# Patient Record
Sex: Female | Born: 1990 | Race: Black or African American | Hispanic: No | Marital: Single | Smoking: Never smoker
Health system: Southern US, Community
[De-identification: ages and names within clinical notes are randomized; demographics above are authoritative.]

## PROBLEM LIST (undated history)

## (undated) ENCOUNTER — Inpatient Hospital Stay (HOSPITAL_COMMUNITY): Payer: Self-pay

## (undated) DIAGNOSIS — I10 Essential (primary) hypertension: Secondary | ICD-10-CM

## (undated) DIAGNOSIS — R0600 Dyspnea, unspecified: Secondary | ICD-10-CM

## (undated) DIAGNOSIS — R51 Headache: Secondary | ICD-10-CM

## (undated) DIAGNOSIS — R519 Headache, unspecified: Secondary | ICD-10-CM

## (undated) DIAGNOSIS — D649 Anemia, unspecified: Secondary | ICD-10-CM

## (undated) DIAGNOSIS — F419 Anxiety disorder, unspecified: Secondary | ICD-10-CM

## (undated) HISTORY — DX: Headache, unspecified: R51.9

## (undated) HISTORY — DX: Anemia, unspecified: D64.9

## (undated) HISTORY — DX: Dyspnea, unspecified: R06.00

## (undated) HISTORY — DX: Headache: R51

---
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
## 2008-05-14 ENCOUNTER — Emergency Department (HOSPITAL_COMMUNITY): Admission: EM | Admit: 2008-05-14 | Discharge: 2008-05-14 | Payer: Self-pay | Admitting: Emergency Medicine

## 2009-05-26 ENCOUNTER — Emergency Department (HOSPITAL_COMMUNITY): Admission: EM | Admit: 2009-05-26 | Discharge: 2009-05-26 | Payer: Self-pay | Admitting: Emergency Medicine

## 2011-12-10 ENCOUNTER — Encounter (HOSPITAL_COMMUNITY): Payer: Self-pay

## 2011-12-10 ENCOUNTER — Emergency Department (INDEPENDENT_AMBULATORY_CARE_PROVIDER_SITE_OTHER)
Admission: EM | Admit: 2011-12-10 | Discharge: 2011-12-10 | Disposition: A | Discharge status: Home / Self Care | Payer: Self-pay | Source: Home / Self Care | Attending: Family Medicine | Admitting: Family Medicine

## 2011-12-10 DIAGNOSIS — J029 Acute pharyngitis, unspecified: Secondary | ICD-10-CM

## 2011-12-10 HISTORY — DX: Essential (primary) hypertension: I10

## 2011-12-10 LAB — POCT RAPID STREP A: Streptococcus, Group A Screen (Direct): NEGATIVE

## 2011-12-10 MED ORDER — LIDOCAINE VISCOUS 2 % MT SOLN: 20.0000 mL | OROMUCOSAL | Status: AC | PRN

## 2011-12-10 MED ORDER — MAGIC MOUTHWASH W/LIDOCAINE: 5.0000 mL | Freq: Three times a day (TID) | ORAL | Status: DC | PRN

## 2011-12-10 NOTE — ED Provider Notes (Signed)
History     CSN: [id number]  Arrival date & time 12/10/11  1914   First MD Initiated Contact with Patient 12/10/11 1008      Chief Complaint  Patient presents with  . Sore Throat  . URI    (Consider location/radiation/quality/duration/timing/severity/associated sxs/prior treatment) HPI Comments: Dawn Hoffman presents for evaluation for URI symptoms. She reports low-grade fever started on Friday. Intermittent cough, nasal congestion. However, upon further interview, she does report recurrent episodes of sore throat. Infection. The mother, states she's had 4 episodes of the last 2 years. Only one episode involved streptococcal pharyngitis. She reports a severe throat swelling and pain at night. This resolves during the day.  Patient is a 21 y.o. female presenting with pharyngitis.  Sore Throat This is a recurrent problem. The current episode started more than 1 week ago. The problem occurs constantly. The problem has not changed since onset.The symptoms are aggravated by swallowing and eating. The symptoms are relieved by nothing.    Past Medical History  Diagnosis Date  . Hypertension   . Asthma     History reviewed. No pertinent past surgical history.  No family history on file.  History  Substance Use Topics  . Smoking status: Never Smoker   . Smokeless tobacco: Not on file  . Alcohol Use: No    OB History    Grav Para Term Preterm Abortions TAB SAB Ect Mult Living                  Review of Systems  Constitutional: Positive for fever.  HENT: Positive for congestion, sore throat, rhinorrhea and trouble swallowing.   Eyes: Negative.   Respiratory: Positive for cough.   Cardiovascular: Negative.   Gastrointestinal: Negative.   Genitourinary: Negative.   Musculoskeletal: Negative.   Skin: Negative.   Neurological: Negative.     Allergies  Review of patient's allergies indicates no known allergies.  Home Medications   Current Outpatient Rx  Name Route Sig  Dispense Refill  . MAGIC MOUTHWASH W/LIDOCAINE Oral Take 5 mLs by mouth 3 (three) times daily as needed. 100 mL 0  . LIDOCAINE VISCOUS 2 % MT SOLN Oral Take 20 mLs by mouth as needed for pain. Gargle with 15 to 20 ml every 3 to 4 hours as needed for pain; do not swallow 100 mL 0    BP 139/99  Pulse 76  Temp(Src) 98.5 F (36.9 C) (Oral)  Resp 20  SpO2 97%  LMP 11/16/2011  Physical Exam  Nursing note and vitals reviewed. Constitutional: She is oriented to person, place, and time. She appears well-developed and well-nourished.  HENT:  Head: Normocephalic and atraumatic.  Right Ear: Tympanic membrane normal.  Left Ear: Tympanic membrane normal.  Mouth/Throat: Uvula is midline and mucous membranes are normal. Posterior oropharyngeal erythema present. No oropharyngeal exudate or posterior oropharyngeal edema.  Eyes: EOM are normal.  Neck: Normal range of motion.  Pulmonary/Chest: Effort normal and breath sounds normal. She has no decreased breath sounds. She has no wheezes. She has no rhonchi.  Musculoskeletal: Normal range of motion.  Neurological: She is alert and oriented to person, place, and time.  Skin: Skin is warm and dry.  Psychiatric: Her behavior is normal.    ED Course  Procedures (including critical care time)   Labs Reviewed  POCT RAPID STREP A ([facility] CARE ONLY)   No results found.   1. Pharyngitis       MDM  It is unclear what is causing her  recurrent episodes of throat soreness; more puzzling is the report of neck and throat swelling at night, with worsening of the pain; symptomatic care given with rx of viscous lidocaine and Magic Mouthwash; referred to [facility].       Richardo Priest, MD 12/10/11 1422

## 2011-12-10 NOTE — ED Notes (Signed)
C/o fever, chest congestion, cough, nasal congestion, post nasal drainage and sore throat since Friday.

## 2011-12-21 ENCOUNTER — Telehealth (HOSPITAL_COMMUNITY): Payer: Self-pay | Admitting: *Deleted

## 2011-12-21 NOTE — ED Notes (Signed)
I called and asked what had happened with the referral. She said she tried to call me at [phone]. I told her her it was [phone]. She said the referral form said [facility]. I told her I had checked and Dr. Suszanne Conners was on call 2/4. I said I did not know he was not in [facility]- Dr. Juanetta Gosling wrote this on the referral form. She scheduled pt. for 2/22 @ 1310. I called and left pt. message to call. Vassie Moselle 12/21/2011

## 2011-12-28 ENCOUNTER — Telehealth (HOSPITAL_COMMUNITY): Payer: Self-pay | Admitting: *Deleted

## 2011-12-28 NOTE — ED Notes (Signed)
2/21 1831 Pt. called back and I told her that her appt. was scheduled for 2/22 @ 1310 with Dr. Suszanne Conners.  She said she cancelled the appointment but will reschedule it. I asked her to let me know when the appt. is so I can document it. Dawn Hoffman 12/28/2011

## 2013-04-06 ENCOUNTER — Encounter (HOSPITAL_COMMUNITY): Payer: Self-pay

## 2013-04-06 ENCOUNTER — Emergency Department (HOSPITAL_COMMUNITY)
Admission: EM | Admit: 2013-04-06 | Discharge: 2013-04-07 | Disposition: A | Discharge status: Home / Self Care | Payer: PRIVATE HEALTH INSURANCE | Attending: Emergency Medicine | Admitting: Emergency Medicine

## 2013-04-06 DIAGNOSIS — J45909 Unspecified asthma, uncomplicated: Secondary | ICD-10-CM | POA: Insufficient documentation

## 2013-04-06 DIAGNOSIS — Z3201 Encounter for pregnancy test, result positive: Secondary | ICD-10-CM | POA: Insufficient documentation

## 2013-04-06 DIAGNOSIS — O209 Hemorrhage in early pregnancy, unspecified: Secondary | ICD-10-CM | POA: Insufficient documentation

## 2013-04-06 DIAGNOSIS — I10 Essential (primary) hypertension: Secondary | ICD-10-CM | POA: Insufficient documentation

## 2013-04-06 LAB — URINALYSIS, ROUTINE W REFLEX MICROSCOPIC
Bilirubin Urine: NEGATIVE
Glucose, UA: NEGATIVE mg/dL
Ketones, ur: NEGATIVE mg/dL
Leukocytes, UA: NEGATIVE
Nitrite: NEGATIVE
Protein, ur: NEGATIVE mg/dL
Specific Gravity, Urine: 1.01 (ref 1.005–1.030)
Urobilinogen, UA: 0.2 mg/dL (ref 0.0–1.0)
pH: 7 (ref 5.0–8.0)

## 2013-04-06 LAB — POCT PREGNANCY, URINE: Preg Test, Ur: POSITIVE — AB

## 2013-04-06 NOTE — ED Provider Notes (Signed)
History     CSN: [id number]  Arrival date & time 04/06/13  2118   First MD Initiated Contact with Patient 04/06/13 2338      Chief Complaint  Patient presents with  . Vaginal Bleeding    (Consider location/radiation/quality/duration/timing/severity/associated sxs/prior treatment) Patient is a 22 y.o. female presenting with vaginal bleeding. The history is provided by the patient.  Vaginal Bleeding Quality:  Clots and bright red Severity:  Moderate Associated symptoms: no abdominal pain, no back pain, no dysuria, no fever and no vaginal discharge   Associated symptoms comment:  She has had vaginal bleeding for the past 7 days with some mild pelvic cramping. She had recently had a positive pregnancy test that, by date 2 weeks ago, was 5 weeks and 3 days. No dysuria, fever, vomiting.   Past Medical History  Diagnosis Date  . Hypertension   . Asthma     History reviewed. No pertinent past surgical history.  History reviewed. No pertinent family history.  History  Substance Use Topics  . Smoking status: Never Smoker   . Smokeless tobacco: Not on file  . Alcohol Use: No    OB History   Grav Para Term Preterm Abortions TAB SAB Ect Mult Living                  Review of Systems  Constitutional: Negative for fever.  Respiratory: Negative for shortness of breath.   Cardiovascular: Negative for chest pain.  Gastrointestinal: Negative for vomiting and abdominal pain.  Genitourinary: Positive for vaginal bleeding and pelvic pain. Negative for dysuria and vaginal discharge.  Musculoskeletal: Negative for back pain.  Neurological: Negative for light-headedness.    Allergies  Review of patient's allergies indicates no known allergies.  Home Medications   Current Outpatient Rx  Name  Route  Sig  Dispense  Refill  . fexofenadine (ALLEGRA) 180 MG tablet   Oral   Take 180 mg by mouth daily.           BP 179/110  Pulse 71  Temp(Src) 98.7 F (37.1 C) (Oral)  Resp  20  Ht 5' (1.524 m)  Wt 147 lb (66.679 kg)  BMI 28.71 kg/m2  SpO2 100%  LMP 03/01/2013  Physical Exam  Constitutional: She is oriented to person, place, and time. She appears well-developed and well-nourished. No distress.  HENT:  Head: Normocephalic.  Eyes: Conjunctivae are normal.  Neck: Normal range of motion. Neck supple.  Cardiovascular: Normal rate and regular rhythm.   Pulmonary/Chest: Effort normal and breath sounds normal.  Abdominal: Soft. Bowel sounds are normal. There is no tenderness. There is no rebound and no guarding.  Genitourinary:  Mild cervical bleeding. No discharge. Os closed. Mildly tender left adnexa without mass.   Musculoskeletal: Normal range of motion.  Neurological: She is alert and oriented to person, place, and time.  Skin: Skin is warm and dry. No rash noted.  Psychiatric: She has a normal mood and affect.    ED Course  Procedures (including critical care time)  Labs Reviewed  URINALYSIS, ROUTINE W REFLEX MICROSCOPIC - Abnormal; Notable for the following:    Hgb urine dipstick LARGE (*)    All other components within normal limits  POCT PREGNANCY, URINE - Abnormal; Notable for the following:    Preg Test, Ur POSITIVE (*)    All other components within normal limits  URINE MICROSCOPIC-ADD ON   No results found.   No diagnosis found.    MDM  Patient care transferred to  Earley Favor, NP, pending labs and pelvic ultrasound.   Arnoldo Hooker, PA-C 04/07/13 [phone]

## 2013-04-06 NOTE — ED Notes (Signed)
Pt was told she was three weeks pregnant last week, Tuesday she started to spot, Saturday the bleeding was heavier, tonight is like a regular period for her

## 2013-04-06 NOTE — ED Notes (Signed)
Pt took two home pregnancy tests and both were positive, [facility]

## 2013-04-07 ENCOUNTER — Emergency Department (HOSPITAL_COMMUNITY): Payer: PRIVATE HEALTH INSURANCE

## 2013-04-07 LAB — GC/CHLAMYDIA PROBE AMP: GC Probe RNA: NEGATIVE

## 2013-04-07 LAB — CBC WITH DIFFERENTIAL/PLATELET
HCT: 35.4 % — ABNORMAL LOW (ref 36.0–46.0)
Hemoglobin: 12.1 g/dL (ref 12.0–15.0)
Lymphocytes Relative: 29 % (ref 12–46)
Lymphs Abs: 1.9 10*3/uL (ref 0.7–4.0)
MCHC: 34.2 g/dL (ref 30.0–36.0)
Monocytes Absolute: 0.5 10*3/uL (ref 0.1–1.0)
Monocytes Relative: 7 % (ref 3–12)
Neutro Abs: 4.2 10*3/uL (ref 1.7–7.7)
Neutrophils Relative %: 63 % (ref 43–77)
RBC: 4.5 MIL/uL (ref 3.87–5.11)
WBC: 6.7 10*3/uL (ref 4.0–10.5)

## 2013-04-07 LAB — HCG, QUANTITATIVE, PREGNANCY: hCG, Beta Chain, Quant, S: 2532 m[IU]/mL — ABNORMAL HIGH (ref <5)

## 2013-04-07 LAB — BASIC METABOLIC PANEL
BUN: 7 mg/dL (ref 6–23)
CO2: 27 mEq/L (ref 19–32)
Chloride: 99 mEq/L (ref 96–112)
Creatinine, Ser: 0.5 mg/dL (ref 0.50–1.10)
Glucose, Bld: 139 mg/dL — ABNORMAL HIGH (ref 70–99)
Potassium: 3.3 mEq/L — ABNORMAL LOW (ref 3.5–5.1)

## 2013-04-07 LAB — WET PREP, GENITAL

## 2013-04-07 NOTE — ED Provider Notes (Signed)
  Physical Exam  BP 179/110  Pulse 71  Temp(Src) 98.7 F (37.1 C) (Oral)  Resp 20  Ht 5' (1.524 m)  Wt 147 lb (66.679 kg)  BMI 28.71 kg/m2  SpO2 100%  LMP 03/01/2013  Physical Exam Ultrasound shows that there is a small 6 week intrauterine, oval sac, without cardiac activity concerning for a blighted ovum.  I spoke with Dr. Hanley Hays history testing.  The patient had a repeat ultrasound in 2 weeks to assess for fetal growth.  She is to go to [facility] hospital.  A week from Friday for this procedure.  If she develops any new or worsening symptoms.  She is to go immediately to [facility] hospital for further evaluation ED Course  Procedures  MDM       Arman Filter, NP 04/07/13 [phone]

## 2013-04-07 NOTE — ED Provider Notes (Signed)
Medical screening examination/treatment/procedure(s) were performed by non-physician practitioner and as supervising physician I was immediately available for consultation/collaboration.  Ariahna Smiddy L Arneshia Ade, MD 04/07/13 1531 

## 2013-04-07 NOTE — ED Provider Notes (Signed)
Medical screening examination/treatment/procedure(s) were performed by non-physician practitioner and as supervising physician I was immediately available for consultation/collaboration.  Flint Melter, MD 04/07/13 1041

## 2013-04-07 NOTE — ED Notes (Signed)
Pt states she found out she was pregnant [redacted] weeks ago, was 3.5 weeks, last Tuesday started spotting, then Saturday started having heavy bleeding w/ clots, states she feels like she's having a regular menstrual cycle. Pt states also having heavy abdominal cramping, 5/10.

## 2013-04-07 NOTE — Progress Notes (Signed)
Patient ID: Dawn Hoffman, female   DOB: 14-Mar-1991, 22 y.o.   MRN: [id number]  Reviewed chart and symptoms.  Beta 2000 with gestational sac.  Does not meet criteria for blighted ovum yet.  Will rescan in 10 days to assess for viability.  Per radiologist, no evidence of ectopic pregnancy. [place] ordered as future and radiology will call her with an appointment later today.  Dawn Hoffman H. 5:55 AM

## 2013-04-08 ENCOUNTER — Other Ambulatory Visit: Payer: Self-pay | Admitting: Obstetrics and Gynecology

## 2016-10-19 ENCOUNTER — Encounter (HOSPITAL_COMMUNITY): Payer: Self-pay

## 2016-10-19 ENCOUNTER — Emergency Department (HOSPITAL_COMMUNITY): Payer: Self-pay

## 2016-10-19 ENCOUNTER — Emergency Department (HOSPITAL_COMMUNITY)
Admission: EM | Admit: 2016-10-19 | Discharge: 2016-10-19 | Disposition: A | Discharge status: Home / Self Care | Payer: Self-pay | Attending: Emergency Medicine | Admitting: Emergency Medicine

## 2016-10-19 DIAGNOSIS — Y999 Unspecified external cause status: Secondary | ICD-10-CM | POA: Insufficient documentation

## 2016-10-19 DIAGNOSIS — Y92410 Unspecified street and highway as the place of occurrence of the external cause: Secondary | ICD-10-CM | POA: Insufficient documentation

## 2016-10-19 DIAGNOSIS — J45909 Unspecified asthma, uncomplicated: Secondary | ICD-10-CM | POA: Insufficient documentation

## 2016-10-19 DIAGNOSIS — S39012A Strain of muscle, fascia and tendon of lower back, initial encounter: Secondary | ICD-10-CM | POA: Insufficient documentation

## 2016-10-19 DIAGNOSIS — S161XXA Strain of muscle, fascia and tendon at neck level, initial encounter: Secondary | ICD-10-CM | POA: Insufficient documentation

## 2016-10-19 DIAGNOSIS — Y939 Activity, unspecified: Secondary | ICD-10-CM | POA: Insufficient documentation

## 2016-10-19 DIAGNOSIS — I10 Essential (primary) hypertension: Secondary | ICD-10-CM | POA: Insufficient documentation

## 2016-10-19 LAB — POC URINE PREG, ED: Preg Test, Ur: NEGATIVE

## 2016-10-19 MED ORDER — CYCLOBENZAPRINE HCL 10 MG PO TABS
10.0000 mg | ORAL_TABLET | Freq: Once | ORAL | Status: AC
  Administered 2016-10-19: 10 mg via ORAL
  Filled 2016-10-19: qty 1

## 2016-10-19 MED ORDER — CYCLOBENZAPRINE HCL 10 MG PO TABS: 10.0000 mg | ORAL_TABLET | Freq: Two times a day (BID) | ORAL | 0 refills | Status: DC | PRN

## 2016-10-19 MED ORDER — NAPROXEN 500 MG PO TABS: 500.0000 mg | ORAL_TABLET | Freq: Two times a day (BID) | ORAL | 0 refills | Status: DC
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
## 2016-10-19 NOTE — Discharge Instructions (Signed)
Do not take the muscle relaxant while driving as it will make you sleepy. Follow up with your doctor or return here for worsening symptoms.  °
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
## 2017-03-03 ENCOUNTER — Emergency Department (HOSPITAL_COMMUNITY)
Admission: EM | Admit: 2017-03-03 | Discharge: 2017-03-03 | Disposition: A | Discharge status: Home / Self Care | Payer: PRIVATE HEALTH INSURANCE | Attending: Emergency Medicine | Admitting: Emergency Medicine

## 2017-03-03 ENCOUNTER — Emergency Department (HOSPITAL_COMMUNITY): Payer: PRIVATE HEALTH INSURANCE

## 2017-03-03 ENCOUNTER — Encounter (HOSPITAL_COMMUNITY): Payer: Self-pay

## 2017-03-03 DIAGNOSIS — M546 Pain in thoracic spine: Secondary | ICD-10-CM | POA: Insufficient documentation

## 2017-03-03 DIAGNOSIS — I10 Essential (primary) hypertension: Secondary | ICD-10-CM | POA: Insufficient documentation

## 2017-03-03 DIAGNOSIS — Y939 Activity, unspecified: Secondary | ICD-10-CM | POA: Diagnosis not present

## 2017-03-03 DIAGNOSIS — S99912A Unspecified injury of left ankle, initial encounter: Secondary | ICD-10-CM | POA: Diagnosis present

## 2017-03-03 DIAGNOSIS — Y999 Unspecified external cause status: Secondary | ICD-10-CM | POA: Insufficient documentation

## 2017-03-03 DIAGNOSIS — J45909 Unspecified asthma, uncomplicated: Secondary | ICD-10-CM | POA: Insufficient documentation

## 2017-03-03 DIAGNOSIS — Y92410 Unspecified street and highway as the place of occurrence of the external cause: Secondary | ICD-10-CM | POA: Insufficient documentation

## 2017-03-03 DIAGNOSIS — S96912A Strain of unspecified muscle and tendon at ankle and foot level, left foot, initial encounter: Secondary | ICD-10-CM | POA: Insufficient documentation

## 2017-03-03 MED ORDER — NAPROXEN 500 MG PO TABS: 500.0000 mg | ORAL_TABLET | Freq: Two times a day (BID) | ORAL | 0 refills | Status: DC

## 2017-03-03 MED ORDER — METHOCARBAMOL 500 MG PO TABS: 500.0000 mg | ORAL_TABLET | Freq: Every evening | ORAL | 0 refills | Status: DC | PRN

## 2017-03-03 NOTE — ED Notes (Signed)
Patient transported to X-ray 
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
## 2017-03-03 NOTE — Discharge Instructions (Signed)
As discussed, follow the rice protocol provided in this packet. Follow up with orthopedics or your primary care provider if symptoms persist. Ibuprofen for pain and swelling.  Return to the emergency department if you experience loss of sensation in your foot, difficulty walking or any other new concerning symptoms in the meantime.

## 2017-03-03 NOTE — ED Notes (Signed)
Pt. c/o neck, upper back pain, and stated, my feet are swollen

## 2018-09-17 DIAGNOSIS — Z3402 Encounter for supervision of normal first pregnancy, second trimester: Secondary | ICD-10-CM | POA: Insufficient documentation

## 2018-09-18 ENCOUNTER — Encounter: Payer: Self-pay | Admitting: Obstetrics

## 2018-09-18 ENCOUNTER — Ambulatory Visit (INDEPENDENT_AMBULATORY_CARE_PROVIDER_SITE_OTHER): Payer: BLUE CROSS/BLUE SHIELD | Admitting: Obstetrics

## 2018-09-18 ENCOUNTER — Other Ambulatory Visit: Payer: Self-pay

## 2018-09-18 VITALS — BP 119/88 | HR 76 | Ht 61.0 in | Wt 189.9 lb

## 2018-09-18 DIAGNOSIS — J310 Chronic rhinitis: Secondary | ICD-10-CM | POA: Diagnosis not present

## 2018-09-18 DIAGNOSIS — Z3402 Encounter for supervision of normal first pregnancy, second trimester: Secondary | ICD-10-CM

## 2018-09-18 DIAGNOSIS — M545 Low back pain: Secondary | ICD-10-CM

## 2018-09-18 DIAGNOSIS — G8929 Other chronic pain: Secondary | ICD-10-CM | POA: Diagnosis not present

## 2018-09-18 MED ORDER — LORATADINE 10 MG PO TABS: 10.0000 mg | ORAL_TABLET | Freq: Every day | ORAL | 11 refills | Status: DC

## 2018-09-18 MED ORDER — PRENATAL PLUS 27-1 MG PO TABS: 1.0000 | ORAL_TABLET | Freq: Every day | ORAL | 4 refills | Status: DC

## 2018-09-18 MED ORDER — PRENATAL PLUS 27-1 MG PO TABS: 1.0000 | ORAL_TABLET | Freq: Every day | ORAL | 11 refills | Status: DC

## 2018-09-18 MED ORDER — COMFORT FIT MATERNITY SUPP SM MISC: 0 refills | Status: DC

## 2018-09-18 NOTE — Progress Notes (Signed)
[facility]  Last PAP 07/23/18.  Declined FLU.  Ultrasound done on 07/23/18 /EDD 02/20/18.

## 2018-09-18 NOTE — Progress Notes (Signed)
Subjective:    Dawn Hoffman is being seen today for her first obstetrical visit.  This is a planned pregnancy. She is at 205w2d gestation. Her obstetrical history is significant for none. Relationship with FOB: unknown. Patient does intend to breast feed. Pregnancy history fully reviewed.  The information documented in the HPI was reviewed and verified.  Menstrual History: OB History    Gravida  2   Para      Term      Preterm      AB  1   Living  0     SAB      TAB      Ectopic      Multiple      Live Births               Patient's last menstrual period was 05/13/2018 (exact date).    Past Medical History:  Diagnosis Date  . Anemia   . Asthma   . Dyspnea   . Headache   . Hypertension     History reviewed. No pertinent surgical history.   (Not in a hospital admission) No Known Allergies  Social History   Tobacco Use  . Smoking status: Never Smoker  . Smokeless tobacco: Never Used  Substance Use Topics  . Alcohol use: No    Family History  Problem Relation Age of Onset  . Alcohol abuse Father   . Drug abuse Father   . Cancer Maternal Grandmother   . Diabetes Maternal Grandfather      Review of Systems Constitutional: negative for weight loss Gastrointestinal: negative for vomiting Genitourinary:negative for genital lesions and vaginal discharge and dysuria Musculoskeletal:negative for back pain Behavioral/Psych: negative for abusive relationship, depression, illegal drug usage and tobacco use    Objective:    BP 119/88   Pulse 76   Ht 5\' 1"  (1.549 m)   Wt 189 lb 14.4 oz (86.1 kg)   LMP 05/13/2018 (Exact Date)   BMI 35.88 kg/m  General Appearance:    Alert, cooperative, no distress, appears stated age  Head:    Normocephalic, without obvious abnormality, atraumatic  Eyes:    PERRL, conjunctiva/corneas clear, EOM's intact, fundi    benign, both eyes  Ears:    Normal TM's and external ear canals, both ears  Nose:   Nares  normal, septum midline, mucosa normal, no drainage    or sinus tenderness  Throat:   Lips, mucosa, and tongue normal; teeth and gums normal  Neck:   Supple, symmetrical, trachea midline, no adenopathy;    thyroid:  no enlargement/tenderness/nodules; no carotid   bruit or JVD  Back:     Symmetric, no curvature, ROM normal, no CVA tenderness  Lungs:     Clear to auscultation bilaterally, respirations unlabored  Chest Wall:    No tenderness or deformity   Heart:    Regular rate and rhythm, S1 and S2 normal, no murmur, rub   or gallop  Breast Exam:    No tenderness, masses, or nipple abnormality  Abdomen:     Soft, non-tender, bowel sounds active all four quadrants,    no masses, no organomegaly  Genitalia:    Normal female without lesion, discharge or tenderness  Extremities:   Extremities normal, atraumatic, no cyanosis or edema  Pulses:   2+ and symmetric all extremities  Skin:   Skin color, texture, turgor normal, no rashes or lesions  Lymph nodes:   Cervical, supraclavicular, and axillary nodes normal  Neurologic:  CNII-XII intact, normal strength, sensation and reflexes    throughout      Lab Review Urine pregnancy test Labs reviewed yes Radiologic studies reviewed yes Assessment:    Pregnancy at [redacted]w[redacted]d weeks    Plan:     1. Encounter for supervision of normal first pregnancy in second trimester Rx: - Enroll Patient in Babyscripts - Obstetric Panel, Including HIV - Genetic Screening - AFP, Serum, Open Spina Bifida - [place] MFM OB COMP + 14 WK; Future - prenatal vitamin w/FE, FA (PRENATAL 1 + 1) 27-1 MG TABS tablet; Take 1 tablet by mouth daily before breakfast.  Dispense: 90 each; Refill: 4  2. Chronic midline low back pain without sciatica Rx: - Elastic Bandages & Supports (COMFORT FIT MATERNITY SUPP SM) MISC; Wear as directed.  Dispense: 1 each; Refill: 0  3. Chronic rhinitis, seasonal, allergic Rx: - loratadine (CLARITIN) 10 MG tablet; Take 1 tablet (10 mg total) by  mouth daily.  Dispense: 30 tablet; Refill: 11  Prenatal vitamins.  Counseling provided regarding continued use of seat belts, cessation of alcohol consumption, smoking or use of illicit drugs; infection precautions i.e., influenza/TDAP immunizations, toxoplasmosis,CMV, parvovirus, listeria and varicella; workplace safety, exercise during pregnancy; routine dental care, safe medications, sexual activity, hot tubs, saunas, pools, travel, caffeine use, fish and methlymercury, potential toxins, hair treatments, varicose veins Weight gain recommendations per IOM guidelines reviewed: underweight/BMI< 18.5--> gain 28 - 40 lbs; normal weight/BMI 18.5 - 24.9--> gain 25 - 35 lbs; overweight/BMI 25 - 29.9--> gain 15 - 25 lbs; obese/BMI >30->gain  11 - 20 lbs Problem list reviewed and updated. FIRST/CF mutation testing/NIPT/QUAD SCREEN/fragile X/Ashkenazi Jewish population testing/Spinal muscular atrophy discussed: requested. Role of ultrasound in pregnancy discussed; fetal survey: requested. Amniocentesis discussed: not indicated.  Meds ordered this encounter  Medications  . Elastic Bandages & Supports (COMFORT FIT MATERNITY SUPP SM) MISC    Sig: Wear as directed.    Dispense:  1 each    Refill:  0   Orders Placed This Encounter  Procedures  . [place] MFM OB COMP + 14 WK    Standing Status:   Future    Standing Expiration Date:   11/19/2019    Order Specific Question:   Reason for Exam (SYMPTOM  OR DIAGNOSIS REQUIRED)    Answer:   Anatomy    Order Specific Question:   Preferred Imaging Location?    Answer:   [facility]-Ultrasound  . Obstetric Panel, Including HIV  . Genetic Screening  . AFP, Serum, Open Spina Bifida    Order Specific Question:   Is patient insulin dependent?    Answer:   No    Order Specific Question:   Weight (lbs)    Answer:   80    Order Specific Question:   Gestational Age (GA), weeks    Answer:   71    Order Specific Question:   Date on which patient was at this GA    Answer:    09/18/2018    Order Specific Question:   GA Calculation Method    Answer:   LMP    Order Specific Question:   Number of fetuses    Answer:   1    Follow up in 4 weeks. 50% of 20 min visit spent on counseling and coordination of care.     Brock Bad MD 09-18-2018

## 2018-09-19 LAB — OBSTETRIC PANEL, INCLUDING HIV
Antibody Screen: NEGATIVE
Basophils Absolute: 0 10*3/uL (ref 0.0–0.2)
Basos: 0 %
EOS (ABSOLUTE): 0.2 10*3/uL (ref 0.0–0.4)
EOS: 2 %
HEMATOCRIT: 35.2 % (ref 34.0–46.6)
HEMOGLOBIN: 11.8 g/dL (ref 11.1–15.9)
HEP B S AG: NEGATIVE
HIV SCREEN 4TH GENERATION: NONREACTIVE
IMMATURE GRANULOCYTES: 0 %
Immature Grans (Abs): 0 10*3/uL (ref 0.0–0.1)
Lymphocytes Absolute: 1.6 10*3/uL (ref 0.7–3.1)
Lymphs: 22 %
MCH: 26.1 pg — AB (ref 26.6–33.0)
MCHC: 33.5 g/dL (ref 31.5–35.7)
MCV: 78 fL — AB (ref 79–97)
Monocytes Absolute: 0.6 10*3/uL (ref 0.1–0.9)
Monocytes: 8 %
NEUTROS ABS: 5.1 10*3/uL (ref 1.4–7.0)
Neutrophils: 68 %
Platelets: 270 10*3/uL (ref 150–450)
RBC: 4.52 x10E6/uL (ref 3.77–5.28)
RDW: 12.7 % (ref 12.3–15.4)
RH TYPE: POSITIVE
RPR: NONREACTIVE
Rubella Antibodies, IGG: 2 index (ref >0.99)
WBC: 7.5 10*3/uL (ref 3.4–10.8)

## 2018-09-20 LAB — AFP, SERUM, OPEN SPINA BIFIDA
AFP MoM: 3.03
AFP Value: 127.2 ng/mL
Gest. Age on Collection Date: 18 weeks
MATERNAL AGE AT EDD: 29.9 a
OSBR Risk 1 IN: 224
TEST RESULTS AFP: POSITIVE — AB
Weight: 183 [lb_av]

## 2018-09-22 ENCOUNTER — Encounter (HOSPITAL_COMMUNITY): Payer: Self-pay

## 2018-09-30 ENCOUNTER — Ambulatory Visit (HOSPITAL_COMMUNITY)
Admission: RE | Admit: 2018-09-30 | Discharge: 2018-09-30 | Disposition: A | Discharge status: Home / Self Care | Payer: BLUE CROSS/BLUE SHIELD | Source: Ambulatory Visit | Attending: Obstetrics | Admitting: Obstetrics

## 2018-09-30 ENCOUNTER — Ambulatory Visit (HOSPITAL_COMMUNITY): Payer: BLUE CROSS/BLUE SHIELD

## 2018-09-30 ENCOUNTER — Other Ambulatory Visit: Payer: Self-pay | Admitting: Obstetrics

## 2018-09-30 DIAGNOSIS — Z363 Encounter for antenatal screening for malformations: Secondary | ICD-10-CM | POA: Diagnosis not present

## 2018-09-30 DIAGNOSIS — O99212 Obesity complicating pregnancy, second trimester: Secondary | ICD-10-CM | POA: Diagnosis not present

## 2018-09-30 DIAGNOSIS — O365920 Maternal care for other known or suspected poor fetal growth, second trimester, not applicable or unspecified: Secondary | ICD-10-CM | POA: Diagnosis not present

## 2018-09-30 DIAGNOSIS — Z3A20 20 weeks gestation of pregnancy: Secondary | ICD-10-CM | POA: Insufficient documentation

## 2018-09-30 DIAGNOSIS — O10012 Pre-existing essential hypertension complicating pregnancy, second trimester: Secondary | ICD-10-CM

## 2018-09-30 DIAGNOSIS — Z3402 Encounter for supervision of normal first pregnancy, second trimester: Secondary | ICD-10-CM

## 2018-10-01 ENCOUNTER — Other Ambulatory Visit (HOSPITAL_COMMUNITY): Payer: Self-pay | Admitting: *Deleted

## 2018-10-01 DIAGNOSIS — O365920 Maternal care for other known or suspected poor fetal growth, second trimester, not applicable or unspecified: Secondary | ICD-10-CM

## 2018-10-13 ENCOUNTER — Other Ambulatory Visit: Payer: Self-pay | Admitting: Obstetrics

## 2018-10-16 ENCOUNTER — Ambulatory Visit (INDEPENDENT_AMBULATORY_CARE_PROVIDER_SITE_OTHER): Payer: BLUE CROSS/BLUE SHIELD | Admitting: Obstetrics

## 2018-10-16 ENCOUNTER — Inpatient Hospital Stay (HOSPITAL_COMMUNITY)
Admission: AD | Admit: 2018-10-16 | Discharge: 2018-10-16 | Disposition: A | Discharge status: Home / Self Care | Payer: Medicaid Other | Attending: Obstetrics & Gynecology | Admitting: Obstetrics & Gynecology

## 2018-10-16 ENCOUNTER — Encounter: Payer: Self-pay | Admitting: Obstetrics

## 2018-10-16 ENCOUNTER — Encounter (HOSPITAL_COMMUNITY): Payer: Self-pay

## 2018-10-16 VITALS — BP 154/100 | HR 69 | Wt 191.0 lb

## 2018-10-16 DIAGNOSIS — Z7982 Long term (current) use of aspirin: Secondary | ICD-10-CM | POA: Diagnosis not present

## 2018-10-16 DIAGNOSIS — Z3A22 22 weeks gestation of pregnancy: Secondary | ICD-10-CM | POA: Diagnosis not present

## 2018-10-16 DIAGNOSIS — O10012 Pre-existing essential hypertension complicating pregnancy, second trimester: Secondary | ICD-10-CM | POA: Diagnosis not present

## 2018-10-16 DIAGNOSIS — R030 Elevated blood-pressure reading, without diagnosis of hypertension: Secondary | ICD-10-CM | POA: Diagnosis present

## 2018-10-16 DIAGNOSIS — O10912 Unspecified pre-existing hypertension complicating pregnancy, second trimester: Secondary | ICD-10-CM | POA: Diagnosis not present

## 2018-10-16 DIAGNOSIS — Z3402 Encounter for supervision of normal first pregnancy, second trimester: Secondary | ICD-10-CM

## 2018-10-16 DIAGNOSIS — O10919 Unspecified pre-existing hypertension complicating pregnancy, unspecified trimester: Secondary | ICD-10-CM

## 2018-10-16 DIAGNOSIS — D563 Thalassemia minor: Secondary | ICD-10-CM

## 2018-10-16 LAB — CBC
HCT: 34 % — ABNORMAL LOW (ref 36.0–46.0)
Hemoglobin: 11.3 g/dL — ABNORMAL LOW (ref 12.0–15.0)
MCH: 26.8 pg (ref 26.0–34.0)
MCHC: 33.2 g/dL (ref 30.0–36.0)
MCV: 80.8 fL (ref 80.0–100.0)
PLATELETS: 246 10*3/uL (ref 150–400)
RBC: 4.21 MIL/uL (ref 3.87–5.11)
RDW: 13.1 % (ref 11.5–15.5)
WBC: 9.6 10*3/uL (ref 4.0–10.5)
nRBC: 0 % (ref 0.0–0.2)

## 2018-10-16 LAB — COMPREHENSIVE METABOLIC PANEL
ALBUMIN: 3.4 g/dL — AB (ref 3.5–5.0)
ALK PHOS: 25 U/L — AB (ref 38–126)
ALT: 23 U/L (ref 0–44)
ANION GAP: 8 (ref 5–15)
AST: 18 U/L (ref 15–41)
BILIRUBIN TOTAL: 0.4 mg/dL (ref 0.3–1.2)
BUN: 11 mg/dL (ref 6–20)
CHLORIDE: 101 mmol/L (ref 98–111)
CO2: 24 mmol/L (ref 22–32)
Calcium: 9.2 mg/dL (ref 8.9–10.3)
Creatinine, Ser: 0.45 mg/dL (ref 0.44–1.00)
GFR calc Af Amer: >60 mL/min (ref >60)
GFR calc non Af Amer: >60 mL/min (ref >60)
Glucose, Bld: 79 mg/dL (ref 70–99)
Potassium: 3.7 mmol/L (ref 3.5–5.1)
Sodium: 133 mmol/L — ABNORMAL LOW (ref 135–145)
Total Protein: 7.2 g/dL (ref 6.5–8.1)

## 2018-10-16 LAB — PROTEIN / CREATININE RATIO, URINE
Creatinine, Urine: 132 mg/dL
PROTEIN CREATININE RATIO: 0.06 mg/mg{creat} (ref 0.00–0.15)
Total Protein, Urine: 8 mg/dL

## 2018-10-16 MED ORDER — NIFEDIPINE ER OSMOTIC RELEASE 30 MG PO TB24: 30.0000 mg | ORAL_TABLET | Freq: Every day | ORAL | 0 refills | Status: DC

## 2018-10-16 NOTE — Progress Notes (Signed)
Subjective:  Dawn Hoffman is a 27 y.o. G2P0010 at 6890w2d being seen today for ongoing prenatal care.  She is currently monitored for the following issues for this high-risk pregnancy and has MIGRAINE, UNSPEC., W/O INTRACTABLE MIGRAINE; HYPERTENSION, BENIGN SYSTEMIC; RHINITIS, ALLERGIC; ASTHMA, UNSPECIFIED; and Encounter for supervision of normal first pregnancy in second trimester on their problem list.  Patient reports headache and swelling of feet.  Contractions: Not present.  .  Movement: Present. Denies leaking of fluid.   The following portions of the patient's history were reviewed and updated as appropriate: allergies, current medications, past family history, past medical history, past social history, past surgical history and problem list. Problem list updated.  Objective:   Vitals:   10/16/18 1536  BP: (!) 153/95  Pulse: 69  Weight: 191 lb (86.6 kg)    Fetal Status:     Movement: Present     General:  Alert, oriented and cooperative. Patient is in no acute distress.  Skin: Skin is warm and dry. No rash noted.   Cardiovascular: Normal heart rate noted  Respiratory: Normal respiratory effort, no problems with respiration noted  Abdomen: Soft, gravid, appropriate for gestational age. Pain/Pressure: Absent     Pelvic:  Cervical exam deferred        Extremities: Normal range of motion.  Edema: Trace  Mental Status: Normal mood and affect. Normal behavior. Normal judgment and thought content.   Urinalysis:      Assessment and Plan:and evaluation  Pregnancy: G2P0010 at 3990w2d  1. Encounter for supervision of normal first pregnancy in second trimester  2. Chronic hypertension affecting pregnancy - was on HCTZ prior to pregnancy and taken off when pregnancy diagnosed and started on Baby ASA - BP not well controlled - [facility] and evaluation  3. 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 Please refer to After Visit Summary for other counseling recommendations.  Return in about 4 weeks (around 11/13/2018) for ROB.   Brock BadHarper, Dontavius Keim A, MD

## 2018-10-16 NOTE — Discharge Instructions (Signed)
Hypertension During Pregnancy °Hypertension, commonly called high blood pressure, is when the force of blood pumping through your arteries is too strong. Arteries are blood vessels that carry blood from the heart throughout the body. Hypertension during pregnancy can cause problems for you and your baby. Your baby may be born early (prematurely) or may not weigh as much as he or she should at birth. Very bad cases of hypertension during pregnancy can be life-threatening. °Different types of hypertension can occur during pregnancy. These include: °· Chronic hypertension. This happens when: °? You have hypertension before pregnancy and it continues during pregnancy. °? You develop hypertension before you are [redacted] weeks pregnant, and it continues during pregnancy. °· Gestational hypertension. This is hypertension that develops after the 20th week of pregnancy. °· Preeclampsia, also called toxemia of pregnancy. This is a very serious type of hypertension that develops only during pregnancy. It affects the whole body, and it can be very dangerous for you and your baby. ° °Gestational hypertension and preeclampsia usually go away within 6 weeks after your baby is born. Women who have hypertension during pregnancy have a greater chance of developing hypertension later in life or during future pregnancies. °What are the causes? °The exact cause of hypertension is not known. °What increases the risk? °There are certain factors that make it more likely for you to develop hypertension during pregnancy. These include: °· Having hypertension during a previous pregnancy or prior to pregnancy. °· Being overweight. °· Being older than age 40. °· Being pregnant for the first time or being pregnant with more than one baby. °· Becoming pregnant using fertilization methods such as IVF (in vitro fertilization). °· Having diabetes, kidney problems, or systemic lupus erythematosus. °· Having a family history of hypertension. ° °What are the  signs or symptoms? °Chronic hypertension and gestational hypertension rarely cause symptoms. Preeclampsia causes symptoms, which may include: °· Increased protein in your urine. Your health care provider will check for this at every visit before you give birth (prenatal visit). °· Severe headaches. °· Sudden weight gain. °· Swelling of the hands, face, legs, and feet. °· Nausea and vomiting. °· Vision problems, such as blurred or double vision. °· Numbness in the face, arms, legs, and feet. °· Dizziness. °· Slurred speech. °· Sensitivity to bright lights. °· Abdominal pain. °· Convulsions. ° °How is this diagnosed? °You may be diagnosed with hypertension during a routine prenatal exam. At each prenatal visit, you may: °· Have a urine test to check for high amounts of protein in your urine. °· Have your blood pressure checked. A blood pressure reading is recorded as two numbers, such as "120 over 80" (or 120/80). The first ("top") number is called the systolic pressure. It is a measure of the pressure in your arteries when your heart beats. The second ("bottom") number is called the diastolic pressure. It is a measure of the pressure in your arteries as your heart relaxes between beats. Blood pressure is measured in a unit called mm Hg. A normal blood pressure reading is: °? Systolic: below 120. °? Diastolic: below 80. ° °The type of hypertension that you are diagnosed with depends on your test results and when your symptoms developed. °· Chronic hypertension is usually diagnosed before 20 weeks of pregnancy. °· Gestational hypertension is usually diagnosed after 20 weeks of pregnancy. °· Hypertension with high amounts of protein in the urine is diagnosed as preeclampsia. °· Blood pressure measurements that stay above 160 systolic, or above 110 diastolic, are   signs of severe preeclampsia. ° °How is this treated? °Treatment for hypertension during pregnancy varies depending on the type of hypertension you have and how  serious it is. °· If you take medicines called ACE inhibitors to treat chronic hypertension, you may need to switch medicines. ACE inhibitors should not be taken during pregnancy. °· If you have gestational hypertension, you may need to take blood pressure medicine. °· If you are at risk for preeclampsia, your health care provider may recommend that you take a low-dose aspirin every day to prevent high blood pressure during your pregnancy. °· If you have severe preeclampsia, you may need to be hospitalized so you and your baby can be monitored closely. You may also need to take medicine (magnesium sulfate) to prevent seizures and to lower blood pressure. This medicine may be given as an injection or through an IV tube. °· In some cases, if your condition gets worse, you may need to deliver your baby early. ° °Follow these instructions at home: °Eating and drinking °· Drink enough fluid to keep your urine clear or pale yellow. °· Eat a healthy diet that is low in salt (sodium). Do not add salt to your food. Check food labels to see how much sodium a food or beverage contains. °Lifestyle °· Do not use any products that contain nicotine or tobacco, such as cigarettes and e-cigarettes. If you need help quitting, ask your health care provider. °· Do not use alcohol. °· Avoid caffeine. °· Avoid stress as much as possible. Rest and get plenty of sleep. °General instructions °· Take over-the-counter and prescription medicines only as told by your health care provider. °· While lying down, lie on your left side. This keeps pressure off your baby. °· While sitting or lying down, raise (elevate) your feet. Try putting some pillows under your lower legs. °· Exercise regularly. Ask your health care provider what kinds of exercise are best for you. °· Keep all prenatal and follow-up visits as told by your health care provider. This is important. °Contact a health care provider if: °· You have symptoms that your health care  provider told you may require more treatment or monitoring, such as: °? Fever. °? Vomiting. °? Headache. °Get help right away if: °· You have severe abdominal pain or vomiting that does not get better with treatment. °· You suddenly develop swelling in your hands, ankles, or face. °· You gain 4 lbs (1.8 kg) or more in 1 week. °· You develop vaginal bleeding, or you have blood in your urine. °· You do not feel your baby moving as much as usual. °· You have blurred or double vision. °· You have muscle twitching or sudden tightening (spasms). °· You have shortness of breath. °· Your lips or fingernails turn blue. °This information is not intended to replace advice given to you by your health care provider. Make sure you discuss any questions you have with your health care provider. °Document Released: 07/10/2011 Document Revised: 05/11/2016 Document Reviewed: 04/06/2016 °Elsevier Interactive Patient Education © 2018 [employer] ° °

## 2018-10-16 NOTE — MAU Provider Note (Signed)
Chief Complaint: Hypertension   First Provider Initiated Contact with Patient 10/16/18 1740     SUBJECTIVE HPI: Dawn Hoffman is a 27 y.o. G2P0010 at 3772w2d who [facility]. Pt has hx of chronic hypertension. Her antihypertensive was discontinued with onset of pregnancy but she is taking daily baby ASA. States her BPs are normally 130s-140s/90s when she's not pregnancy. Has been having intermittent headaches for the duration of the pregnancy. Denies headache, visual disturbance or epigastric pain today.   Past Medical History:  Diagnosis Date  . Anemia   . Asthma   . Dyspnea   . Headache   . Hypertension    OB History  Gravida Para Term Preterm AB Living  2       1 0  SAB TAB Ectopic Multiple Live Births  1            # Outcome Date GA Lbr Len/2nd Weight Sex Delivery Anes PTL Lv  2 Current           1 SAB            History reviewed. No pertinent surgical history. Social History   Socioeconomic History  . Marital status: Single    Spouse name: Not on file  . Number of children: Not on file  . Years of education: Not on file  . Highest education level: Not on file  Occupational History  . Occupation: Magazine features editorTeacher    Employer: [employer]  Social Needs  . Financial resource strain: Not hard at all  . Food insecurity:    Worry: Never true    Inability: Not on file  . Transportation needs:    Medical: No    Non-medical: No  Tobacco Use  . Smoking status: Never Smoker  . Smokeless tobacco: Never Used  Substance and Sexual Activity  . Alcohol use: No  . Drug use: No  . Sexual activity: Yes  Lifestyle  . Physical activity:    Days per week: Not on file    Minutes per session: Not on file  . Stress: Not on file  Relationships  . Social connections:    Talks on phone: Not on file    Gets together: Not on file    Attends religious service: Not on file    Active member of club or organization: Not  on file    Attends meetings of clubs or organizations: Not on file    Relationship status: Not on file  . Intimate partner violence:    Fear of current or ex partner: Not on file    Emotionally abused: Not on file    Physically abused: Not on file    Forced sexual activity: Not on file  Other Topics Concern  . Not on file  Social History Narrative  . Not on file   Family History  Problem Relation Age of Onset  . Alcohol abuse Father   . Drug abuse Father   . Cancer Maternal Grandmother   . Diabetes Maternal Grandfather    No current facility-administered medications on file prior to encounter.    Current Outpatient Medications on File Prior to Encounter  Medication Sig Dispense Refill  . aspirin EC 81 MG tablet Take 81 mg by mouth daily.    Jae Dire. Elastic Bandages & Supports (COMFORT FIT MATERNITY SUPP SM) MISC Wear as directed. 1 each 0  . fexofenadine (ALLEGRA) 180 MG tablet Take 180 mg by mouth daily.    .Marland Kitchen  prenatal vitamin w/FE, FA (PRENATAL 1 + 1) 27-1 MG TABS tablet Take 1 tablet by mouth daily before breakfast. 90 each 4   No Known Allergies  I have reviewed patient's Past Medical Hx, Surgical Hx, Family Hx, Social Hx, medications and allergies.   Review of Systems  Constitutional: Negative.   Gastrointestinal: Negative.   Genitourinary: Negative.   Neurological: Negative.     OBJECTIVE Patient Vitals for the past 24 hrs:  BP Temp Temp src Pulse Resp SpO2 Height Weight  10/16/18 1821 (!) 143/93 - - 76 - - - -  10/16/18 1800 138/85 - - 66 - - - -  10/16/18 1746 (!) 144/85 - - 74 - - - -  10/16/18 1731 (!) 143/89 - - 78 - - - -  10/16/18 1718 (!) 148/91 - - 69 - - - -  10/16/18 1656 (!) 161/108 98.7 F (37.1 C) Oral 75 18 100 % 5' (1.524 m) 87.1 kg   Constitutional: Well-developed, well-nourished female in no acute distress.  Cardiovascular: normal rate & rhythm, no murmur Respiratory: normal rate and effort. Lung sounds clear throughout GI: Abd soft, non-tender,  Pos BS x 4. No guarding or rebound tenderness MS: Extremities nontender, no edema, normal ROM Neurologic: Alert and oriented x 4.     LAB RESULTS Results for orders placed or performed during the hospital encounter of 10/16/18 (from the past 24 hour(s))  Protein / creatinine ratio, urine     Status: None   Collection Time: 10/16/18  5:18 PM  Result Value Ref Range   Creatinine, Urine 132.00 mg/dL   Total Protein, Urine 8 mg/dL   Protein Creatinine Ratio 0.06 0.00 - 0.15 mg/mg[Cre]  CBC     Status: Abnormal   Collection Time: 10/16/18  5:36 PM  Result Value Ref Range   WBC 9.6 4.0 - 10.5 K/uL   RBC 4.21 3.87 - 5.11 MIL/uL   Hemoglobin 11.3 (L) 12.0 - 15.0 g/dL   HCT 16.1 (L) 09.6 - 04.5 %   MCV 80.8 80.0 - 100.0 fL   MCH 26.8 26.0 - 34.0 pg   MCHC 33.2 30.0 - 36.0 g/dL   RDW 40.9 81.1 - 91.4 %   Platelets 246 150 - 400 K/uL   nRBC 0.0 0.0 - 0.2 %  Comprehensive metabolic panel     Status: Abnormal   Collection Time: 10/16/18  5:36 PM  Result Value Ref Range   Sodium 133 (L) 135 - 145 mmol/L   Potassium 3.7 3.5 - 5.1 mmol/L   Chloride 101 98 - 111 mmol/L   CO2 24 22 - 32 mmol/L   Glucose, Bld 79 70 - 99 mg/dL   BUN 11 6 - 20 mg/dL   Creatinine, Ser 7.82 0.44 - 1.00 mg/dL   Calcium 9.2 8.9 - 95.6 mg/dL   Total Protein 7.2 6.5 - 8.1 g/dL   Albumin 3.4 (L) 3.5 - 5.0 g/dL   AST 18 15 - 41 U/L   ALT 23 0 - 44 U/L   Alkaline Phosphatase 25 (L) 38 - 126 U/L   Total Bilirubin 0.4 0.3 - 1.2 mg/dL   GFR calc non Af Amer >60 >60 mL/min   GFR calc Af Amer >60 >60 mL/min   Anion gap 8 5 - 15    IMAGING No results found.  MAU COURSE Orders Placed This Encounter  Procedures  . CBC  . Comprehensive metabolic panel  . Protein / creatinine ratio, urine  . Discharge patient  Meds ordered this encounter  Medications  . NIFEdipine (PROCARDIA-XL/NIFEDICAL-XL) 30 MG 24 hr tablet    Sig: Take 1 tablet (30 mg total) by mouth daily.    Dispense:  30 tablet    Refill:  0    Order  Specific Question:   Supervising Provider    Answer:   Jaynie Collins A [[id number]]    MDM FHR present via doppler Cycle BPs. Severe range x 1 in triage. Pt asymptomatic PEC labs negative C/w Dr. Vergie Living. Will start on procardia 30 mg XL QD. Pt to have BP check next week.   ASSESSMENT 1. Chronic hypertension complicating or reason for care during pregnancy, second trimester   2. [redacted] weeks gestation of pregnancy     PLAN Discharge home in stable condition. HTN/PEC precautions   Follow-up Information    [facility] Follow up.   Why:  Office will call you for follow up blood pressure check in 1 week Contact information: [address] [place] [address] [phone]         Allergies as of 10/16/2018   No Known Allergies     Medication List    STOP taking these medications   loratadine 10 MG tablet Commonly known as:  CLARITIN     TAKE these medications   aspirin EC 81 MG tablet Take 81 mg by mouth daily.   COMFORT FIT MATERNITY SUPP SM Misc Wear as directed.   fexofenadine 180 MG tablet Commonly known as:  ALLEGRA Take 180 mg by mouth daily.   NIFEdipine 30 MG 24 hr tablet Commonly known as:  PROCARDIA-XL/NIFEDICAL-XL Take 1 tablet (30 mg total) by mouth daily.   prenatal vitamin w/FE, FA 27-1 MG Tabs tablet Take 1 tablet by mouth daily before breakfast.        Judeth Horn, NP 10/16/2018  8:36 PM

## 2018-10-16 NOTE — MAU Note (Signed)
Pt seen in office today, b/p was elevated .

## 2018-10-22 ENCOUNTER — Ambulatory Visit: Payer: BLUE CROSS/BLUE SHIELD

## 2018-10-22 VITALS — BP 146/102 | HR 91 | Wt 193.6 lb

## 2018-10-22 DIAGNOSIS — O10912 Unspecified pre-existing hypertension complicating pregnancy, second trimester: Secondary | ICD-10-CM

## 2018-10-22 MED ORDER — NIFEDIPINE ER OSMOTIC RELEASE 30 MG PO TB24: 30.0000 mg | ORAL_TABLET | Freq: Every day | ORAL | 0 refills | Status: DC

## 2018-10-22 NOTE — Progress Notes (Signed)
Pt is here for BP check. Pt is currently taking baby ASA and procardia 30mg  once daily. Blood pressures reviewed with Dr. Clearance CootsHarper, Dr. Clearance CootsHarper advises patient to take the 30 mg procardia twice per day (every 12 hours) and return for another BP check on Friday. Pt verbalized understanding.

## 2018-10-23 ENCOUNTER — Ambulatory Visit (HOSPITAL_BASED_OUTPATIENT_CLINIC_OR_DEPARTMENT_OTHER)
Admission: RE | Admit: 2018-10-23 | Discharge: 2018-10-23 | Disposition: A | Discharge status: Home / Self Care | Payer: Medicaid Other | Source: Ambulatory Visit | Attending: Obstetrics | Admitting: Obstetrics

## 2018-10-23 ENCOUNTER — Encounter (HOSPITAL_COMMUNITY): Payer: Self-pay | Admitting: *Deleted

## 2018-10-23 ENCOUNTER — Inpatient Hospital Stay (HOSPITAL_COMMUNITY)
Admission: AD | Admit: 2018-10-23 | Discharge: 2018-10-23 | Disposition: A | Discharge status: Home / Self Care | Payer: Medicaid Other | Source: Ambulatory Visit | Attending: Obstetrics and Gynecology | Admitting: Obstetrics and Gynecology

## 2018-10-23 ENCOUNTER — Other Ambulatory Visit (HOSPITAL_COMMUNITY): Payer: Self-pay | Admitting: Maternal & Fetal Medicine

## 2018-10-23 ENCOUNTER — Encounter (HOSPITAL_COMMUNITY): Payer: Self-pay

## 2018-10-23 DIAGNOSIS — O365920 Maternal care for other known or suspected poor fetal growth, second trimester, not applicable or unspecified: Secondary | ICD-10-CM | POA: Diagnosis not present

## 2018-10-23 DIAGNOSIS — O10012 Pre-existing essential hypertension complicating pregnancy, second trimester: Secondary | ICD-10-CM | POA: Insufficient documentation

## 2018-10-23 DIAGNOSIS — Z3A23 23 weeks gestation of pregnancy: Secondary | ICD-10-CM | POA: Diagnosis not present

## 2018-10-23 DIAGNOSIS — O99212 Obesity complicating pregnancy, second trimester: Secondary | ICD-10-CM

## 2018-10-23 DIAGNOSIS — O289 Unspecified abnormal findings on antenatal screening of mother: Secondary | ICD-10-CM

## 2018-10-23 DIAGNOSIS — I10 Essential (primary) hypertension: Secondary | ICD-10-CM | POA: Diagnosis not present

## 2018-10-23 DIAGNOSIS — O283 Abnormal ultrasonic finding on antenatal screening of mother: Secondary | ICD-10-CM

## 2018-10-23 DIAGNOSIS — O26892 Other specified pregnancy related conditions, second trimester: Secondary | ICD-10-CM | POA: Diagnosis not present

## 2018-10-23 DIAGNOSIS — Z362 Encounter for other antenatal screening follow-up: Secondary | ICD-10-CM | POA: Insufficient documentation

## 2018-10-23 DIAGNOSIS — R51 Headache: Secondary | ICD-10-CM | POA: Diagnosis not present

## 2018-10-23 DIAGNOSIS — Z3402 Encounter for supervision of normal first pregnancy, second trimester: Secondary | ICD-10-CM

## 2018-10-23 DIAGNOSIS — R519 Headache, unspecified: Secondary | ICD-10-CM

## 2018-10-23 LAB — COMPREHENSIVE METABOLIC PANEL
ALK PHOS: 28 U/L — AB (ref 38–126)
ALT: 23 U/L (ref 0–44)
AST: 17 U/L (ref 15–41)
Albumin: 3.6 g/dL (ref 3.5–5.0)
Anion gap: 8 (ref 5–15)
BUN: 6 mg/dL (ref 6–20)
CALCIUM: 9.6 mg/dL (ref 8.9–10.3)
CO2: 23 mmol/L (ref 22–32)
CREATININE: 0.41 mg/dL — AB (ref 0.44–1.00)
Chloride: 102 mmol/L (ref 98–111)
Glucose, Bld: 82 mg/dL (ref 70–99)
Potassium: 3.6 mmol/L (ref 3.5–5.1)
Sodium: 133 mmol/L — ABNORMAL LOW (ref 135–145)
Total Bilirubin: 0.1 mg/dL — ABNORMAL LOW (ref 0.3–1.2)
Total Protein: 7.2 g/dL (ref 6.5–8.1)

## 2018-10-23 LAB — CBC
HCT: 34.2 % — ABNORMAL LOW (ref 36.0–46.0)
Hemoglobin: 11.5 g/dL — ABNORMAL LOW (ref 12.0–15.0)
MCH: 26.7 pg (ref 26.0–34.0)
MCHC: 33.6 g/dL (ref 30.0–36.0)
MCV: 79.5 fL — AB (ref 80.0–100.0)
Platelets: 267 10*3/uL (ref 150–400)
RBC: 4.3 MIL/uL (ref 3.87–5.11)
RDW: 13 % (ref 11.5–15.5)
WBC: 10.2 10*3/uL (ref 4.0–10.5)
nRBC: 0 % (ref 0.0–0.2)

## 2018-10-23 LAB — PROTEIN / CREATININE RATIO, URINE: Creatinine, Urine: 13 mg/dL

## 2018-10-23 MED ORDER — NIFEDIPINE ER OSMOTIC RELEASE 30 MG PO TB24: 30.0000 mg | ORAL_TABLET | Freq: Two times a day (BID) | ORAL | 0 refills | Status: DC

## 2018-10-23 MED ORDER — BUTALBITAL-APAP-CAFFEINE 50-325-40 MG PO TABS
2.0000 | ORAL_TABLET | Freq: Once | ORAL | Status: AC
  Administered 2018-10-23: 2 via ORAL
  Filled 2018-10-23: qty 2

## 2018-10-23 MED ORDER — NIFEDIPINE ER OSMOTIC RELEASE 30 MG PO TB24
30.0000 mg | ORAL_TABLET | Freq: Once | ORAL | Status: AC
  Administered 2018-10-23: 30 mg via ORAL
  Filled 2018-10-23: qty 1

## 2018-10-23 MED ORDER — BETAMETHASONE SOD PHOS & ACET 6 (3-3) MG/ML IJ SUSP
12.0000 mg | Freq: Once | INTRAMUSCULAR | Status: AC
  Administered 2018-10-23: 12 mg via INTRAMUSCULAR
  Filled 2018-10-23: qty 2

## 2018-10-23 NOTE — Consult Note (Signed)
U/S images reviewed. Findings reviewed with patient.   Decreased fetal growth is noted.   U/A dopplers reveal AEDF.  Patient is being followed secondary to unexplained elevated MS-AFP. General counseling was then performed.  The association of elevated MSAFP with Spina Bifida, multiple gestations, fetal-maternal bleeding, oligohydramnios, abdominal wall defects, Congenital [place] Nephrosis, intrauterine fetal demise and more advanced gestational age was explained.  Prognosis was discussed as well.   Unexplained elevations of MS-AFP have been associated with IUGR (20-30%) and stillbirth (1-3%).  General counseling was then performed regarding abnormal umbilical artery dopplers.  Resistance to flow in the umbilical artery may be measured by umbilical artery doppler evaluation. Absent end-diastolic and reverse end-diastolic flow indicate severe resistance and increased perinatal morbidity and mortality.  If this occurs before 34 weeks and BPP assessment allows, Celestone for fetal lung maturation should be given.  Low Dose Aspirin 81 mg QD may helpful in reducing umbilical artery resistance in milder cases.  I discussed the benefits of Celestone.  These are typically given at 24 weeks.  I did mention the possibility of receiving them at 23 weeks after NICU consultation.  In the case of reverse end-diastolic flow (and frequently absent end-diastolic flow) with good BPP assessment, delivery should occur 48 hours after the first dose of Celestone.  Patient should remain on continuous fetal monitoring during this period with immediate, delivery should fetal deterioration occur.  BP - Lt - 150/103; Rt. 154/92.  Patient reports headaches three times a week which generally responds to Tylenol.  These headaches preceded the pregnancy.  Patient has been taking Procardia XL 30 mg QD which was increased to BID yesterday (not yet taken). [facility]   Patient is to go to MAU for  evaluation. Questions answered. 30 minutes spent face to face with patient. Recommendations: 1) To MAU for evaluation 2) NICU consultation while in MAU (patient's desire)  3) Twice weekly UA dopplers and FH check  4) Celestone @ 24 weeks if not desired by patient before them 5) Serial U/S for growth every 2 -3 weeks for fetal growth           30  minutes spent in face-to-face consultation with greater than 50% of the time spent in counseling.    Thank you for utilizing our ultrasound and consultative services.  If I may be of any further service, please do not hesitate to contact me.  Sincerely,   Patsi Searsobert L. Haroldine Redler, MD Maternal-Fetal Medicine   Copy of report sent to practitioner/clinic.

## 2018-10-23 NOTE — MAU Provider Note (Signed)
History     CSN: [id number]  Arrival date and time: 10/23/18 1721   First Provider Initiated Contact with Patient 10/23/18 1831      Chief Complaint  Patient presents with  . Hypertension   Dawn Hoffman is a 27 y.o. G2P0010 with CHTN at [redacted]w[redacted]d. She was sent here from [facility] due to absent and reverse end diastolic flow. Per note from MFM they recommend NICU consult, and BMZ at 24 weeks. Patient denies any complaints today. She does have a headache, but has had headaches off and on. She hasn't taken anything for it today. She states that she was advised to increase procardia to 30XL BID. She took her morning dose today, but has not taken her evening dose today. She states that her blood pressure is always up when she comes in because every time she comes in she gets bad news. So, her appointments are very stressful.    OB History    Gravida  2   Para      Term      Preterm      AB  1   Living  0     SAB  1   TAB      Ectopic      Multiple      Live Births              Past Medical History:  Diagnosis Date  . Anemia   . Asthma   . Dyspnea   . Headache   . Hypertension     History reviewed. No pertinent surgical history.  Family History  Problem Relation Age of Onset  . Alcohol abuse Father   . Drug abuse Father   . Cancer Maternal Grandmother   . Diabetes Maternal Grandfather     Social History   Tobacco Use  . Smoking status: Never Smoker  . Smokeless tobacco: Never Used  Substance Use Topics  . Alcohol use: No  . Drug use: No    Allergies: No Known Allergies  Medications Prior to Admission  Medication Sig Dispense Refill Last Dose  . aspirin EC 81 MG tablet Take 81 mg by mouth daily.   Taking  . Elastic Bandages & Supports (COMFORT FIT MATERNITY SUPP SM) MISC Wear as directed. 1 each 0 Taking  . fexofenadine (ALLEGRA) 180 MG tablet Take 180 mg by mouth daily.   Taking  . NIFEdipine (PROCARDIA-XL/NIFEDICAL-XL) 30 MG 24 hr tablet  Take 1 tablet (30 mg total) by mouth daily. 30 tablet 0 Taking  . prenatal vitamin w/FE, FA (PRENATAL 1 + 1) 27-1 MG TABS tablet Take 1 tablet by mouth daily before breakfast. 90 each 4 Taking    Review of Systems Physical Exam   Blood pressure (!) 172/101, pulse 77, last menstrual period 05/13/2018.  Physical Exam  Nursing note and vitals reviewed. Constitutional: She is oriented to person, place, and time. She appears well-developed and well-nourished. No distress.  HENT:  Head: Normocephalic.  Cardiovascular: Normal rate.  Respiratory: Effort normal.  GI: Soft. There is no abdominal tenderness. There is no rebound.  Neurological: She is alert and oriented to person, place, and time.  Skin: Skin is warm and dry.  Psychiatric: She has a normal mood and affect.    NST:  Baseline: 150 Variability: moderate Accels: none Decels: none Toco: none Appropriate for GA  Results for orders placed or performed during the hospital encounter of 10/23/18 (from the past 24 hour(s))  Protein / creatinine ratio,  urine     Status: None   Collection Time: 10/23/18  5:38 PM  Result Value Ref Range   Creatinine, Urine 13.00 mg/dL   Total Protein, Urine <6 mg/dL   Protein Creatinine Ratio        0.00 - 0.15 mg/mg[Cre]  CBC     Status: Abnormal   Collection Time: 10/23/18  6:22 PM  Result Value Ref Range   WBC 10.2 4.0 - 10.5 K/uL   RBC 4.30 3.87 - 5.11 MIL/uL   Hemoglobin 11.5 (L) 12.0 - 15.0 g/dL   HCT 16.1 (L) 09.6 - 04.5 %   MCV 79.5 (L) 80.0 - 100.0 fL   MCH 26.7 26.0 - 34.0 pg   MCHC 33.6 30.0 - 36.0 g/dL   RDW 40.9 81.1 - 91.4 %   Platelets 267 150 - 400 K/uL   nRBC 0.0 0.0 - 0.2 %  Comprehensive metabolic panel     Status: Abnormal   Collection Time: 10/23/18  6:22 PM  Result Value Ref Range   Sodium 133 (L) 135 - 145 mmol/L   Potassium 3.6 3.5 - 5.1 mmol/L   Chloride 102 98 - 111 mmol/L   CO2 23 22 - 32 mmol/L   Glucose, Bld 82 70 - 99 mg/dL   BUN 6 6 - 20 mg/dL    Creatinine, Ser 7.82 (L) 0.44 - 1.00 mg/dL   Calcium 9.6 8.9 - 95.6 mg/dL   Total Protein 7.2 6.5 - 8.1 g/dL   Albumin 3.6 3.5 - 5.0 g/dL   AST 17 15 - 41 U/L   ALT 23 0 - 44 U/L   Alkaline Phosphatase 28 (L) 38 - 126 U/L   Total Bilirubin 0.1 (L) 0.3 - 1.2 mg/dL   GFR calc non Af Amer >60 >60 mL/min   GFR calc Af Amer >60 >60 mL/min   Anion gap 8 5 - 15   [place] Mfm Ob Follow Up  Result Date: 10/23/2018 ----------------------------------------------------------------------  OBSTETRICS REPORT                       (Signed Final 10/23/2018 06:16 pm) ---------------------------------------------------------------------- Patient Info  ID #:       [id number]                          D.O.B.:  30-Sep-1991 (27 yrs)  Name:       Dawn Hoffman            Visit Date: 10/23/2018 04:24 pm ---------------------------------------------------------------------- Performed By  Performed By:     Marcellina Millin          Ref. Address:     [address]  [address]                                                             [place]                                                             [address]  Attending:        Patsi Sears      Location:         [facility]                    MD  Referred By:      Center for                    [facility]                    [facility] - [facility] ---------------------------------------------------------------------- Orders   #  Description                          Code         Ordered By   1  [place] MFM OB FOLLOW UP                  19147.82     Lin Landsman   2  [place] MFM UA CORD DOPPLER               [id number]     Lin Landsman  ----------------------------------------------------------------------   #  Order #                     Accession #                 Episode #   1  [id number]                  [id number]                  [id number]   2  [id number]                  [id number]                  [id number]  ---------------------------------------------------------------------- Indications   Antenatal follow-up for nonvisualized fetal    Z36.2   anatomy   [redacted] weeks gestation of pregnancy  Z3A.23   Obesity complicating pregnancy, second         O99.212   trimester   Abnormal biochemical screen - AFP MoM          O28.9   3.03, OSBR 1:224   Maternal care for known or suspected poor      O36.5920   fetal growth, second trimester, not applicable   or unspecified   Hypertension - Chronic/Pre-existing (HCTZ)     O10.019  ---------------------------------------------------------------------- Vital Signs                                                 Height:        5'1" ---------------------------------------------------------------------- Fetal Evaluation  Num Of Fetuses:         1  Fetal Heart Rate(bpm):  144  Cardiac Activity:       Observed  Presentation:           Breech  Placenta:               Anterior  P. Cord Insertion:      Visualized  Amniotic Fluid  AFI FV:      Within normal limits ---------------------------------------------------------------------- Biometry  BPD:      51.5  mm     G. Age:  21w 5d          3  %    CI:        75.68   %    70 - 86                                                          FL/HC:      16.0   %    19.2 - 20.8  HC:      187.7  mm     G. Age:  21w 0d        < 3  %    HC/AC:      1.31        1.05 - 1.21  AC:      143.3  mm     G. Age:  19w 4d        < 3  %    FL/BPD:     58.3   %    71 - 87  FL:         30  mm     G. Age:  19w 2d        < 3  %    FL/AC:      20.9   %    20 - 24  Est. FW:     308  gm    0 lb 11 oz    < 10  % ---------------------------------------------------------------------- OB History  Gravidity:    3         Term:   0        Prem:   0        SAB:   1  TOP:          1        Ectopic:  0  Living: 0 ---------------------------------------------------------------------- Gestational Age  LMP:           23w 2d        Date:  05/13/18                 EDD:   02/17/19  U/S Today:     20w 3d                                        EDD:   03/09/19  Best:          23w 2d     Det. By:  LMP  (05/13/18)          EDD:   02/17/19 ---------------------------------------------------------------------- Anatomy  Cranium:               Appears normal         Aortic Arch:            Appears normal  Cavum:                 Appears normal         Ductal Arch:            Appears normal  Ventricles:            Appears normal         Diaphragm:              Appears normal  Choroid Plexus:        Appears normal         Stomach:                Appears normal, left                                                                        sided  Cerebellum:            Appears normal         Abdomen:                Appears normal  Posterior Fossa:       Appears normal         Abdominal Wall:         Previously seen  Nuchal Fold:           Previously seen        Cord Vessels:           Previously seen  Face:                  Orbits and profile     Kidneys:                Appear normal                         previously seen  Lips:                  Previously seen        Bladder:                Appears normal  Thoracic:  Appears normal         Spine:                  Appears normal  Heart:                 Appears normal         Upper Extremities:      Previously seen                         (4CH, axis, and situs  RVOT:                  Appears normal         Lower Extremities:      Previously seen  LVOT:                  Appears normal  Other:  Heels previously visualized. ---------------------------------------------------------------------- Doppler - Fetal Vessels  Umbilical Artery                                                            ADFV    RDFV                                                               Yes     Yes ---------------------------------------------------------------------- Cervix Uterus Adnexa  Cervix  Length:            3.7  cm.  Normal appearance by transabdominal scan. ---------------------------------------------------------------------- Comments  U/S images reviewed. Findings reviewed with patient.  Decreased fetal growth is noted.   U/A dopplers reveal AEDF.  Patient is being followed secondary to unexplained elevated  MS-AFP.  General counseling was then performed.  The association of  elevated MSAFP with Spina Bifida, multiple gestations, fetal-  maternal bleeding, oligohydramnios, abdominal wall defects,  Congenital [place] Nephrosis, intrauterine fetal demise and  more advanced gestational age was explained.  Prognosis  was discussed as well.   Unexplained elevations of MS-AFP  have been associated with IUGR (20-30%) and stillbirth (1-  3%).  General counseling was then performed regarding abnormal  umbilical artery dopplers.  Resistance to flow in the umbilical  artery may be measured by umbilical artery doppler  evaluation. Absent end-diastolic and reverse end-diastolic  flow indicate severe resistance and increased perinatal  morbidity and mortality.  If this occurs before 34 weeks and  BPP assessment allows, Celestone for fetal lung maturation  should be given.  Low Dose Aspirin 81 mg QD may helpful in  reducing umbilical artery resistance in milder cases.  I  discussed the benefits of Celestone.  These are typically  given at 24 weeks.  I did mention the possibility of receiving  them at 23 weeks after NICU consultation.  In the case of reverse end-diastolic flow (and frequently  absent end-diastolic flow) with good BPP assessment,  delivery should occur 48 hours after the first dose of  Celestone.  Patient should remain on continuous fetal  monitoring during this period with immediate, delivery should  fetal deterioration occur.  BP -  Lt - 150/103; Rt. 154/92.  Patient reports  headaches  three times a week which generally responds to Tylenol.  These headaches preceded the pregnancy.  Patient has  been taking Procardia XL 30 mg QD which was increased to  BID yesterday (not yet taken).  Case discussed with [facility] back-up   Patient is to go  to MAU for evaluation.  Questions answered.  30 minutes spent face to face with patient.  Recommendations: 1) To MAU for evaluation 2) NICU  consultation while in MAU (patient's desire)  3) Twice weekly  UA dopplers and FH check  4) Celestone @ 24 weeks if not  desired by patient before them 5) Serial U/S for growth every  2 -3 weeks for fetal growth ---------------------------------------------------------------------- Recommendations   1) To MAU for evaluation 2) NICU consultation while in MAU  (patient's desire)  3) Twice weekly UA dopplers and FH check  4) Celestone @ 24 weeks if not desired by patient before  them 5) Serial U/S for growth every 2 -3 weeks for fetal  growth ----------------------------------------------------------------------               Patsi Sears, MD Electronically Signed Final Report   10/23/2018 06:16 pm ----------------------------------------------------------------------  [place] Mfm Ua Cord Doppler  Result Date: 10/23/2018 ----------------------------------------------------------------------  OBSTETRICS REPORT                       (Signed Final 10/23/2018 06:16 pm) ---------------------------------------------------------------------- Patient Info  ID #:       [id number]                          D.O.B.:  [birth year] (27 yrs)  Name:       Dawn Hoffman            Visit Date: 10/23/2018 04:24 pm ---------------------------------------------------------------------- Performed By  Performed By:     Marcellina Millin          Ref. Address:     [address]                                                             [address]                                                             [place]  [address]  Attending:        Patsi Sears      Location:         [facility]                    MD  Referred By:      Center for                    [facility]                    Healthcare - [facility] ---------------------------------------------------------------------- Orders   #  Description                          Code         Ordered By   1  [place] MFM OB FOLLOW UP                  60454.09     Lin Landsman   2  [place] MFM UA CORD DOPPLER               76820.02     Lin Landsman  ----------------------------------------------------------------------   #  Order #                    Accession #                 Episode #   1  [id number]                  [id number]                  [id number]   2  [id number]                  [id number]                  [id number]  ---------------------------------------------------------------------- Indications   Antenatal follow-up for nonvisualized fetal    Z36.2   anatomy   [redacted] weeks gestation of pregnancy                Z3A.23   Obesity complicating pregnancy, second         O99.212   trimester   Abnormal biochemical screen - AFP MoM          O28.9   3.03, OSBR 1:224   Maternal care for known or suspected poor      O36.5920   fetal growth, second trimester, not applicable   or unspecified   Hypertension - Chronic/Pre-existing (HCTZ)     O10.019  ---------------------------------------------------------------------- Vital Signs  Height:        5'1" ---------------------------------------------------------------------- Fetal Evaluation  Num Of Fetuses:         1  Fetal Heart Rate(bpm):  144  Cardiac Activity:       Observed  Presentation:           Breech  Placenta:               Anterior  P.  Cord Insertion:      Visualized  Amniotic Fluid  AFI FV:      Within normal limits ---------------------------------------------------------------------- Biometry  BPD:      51.5  mm     G. Age:  21w 5d          3  %    CI:        75.68   %    70 - 86                                                          FL/HC:      16.0   %    19.2 - 20.8  HC:      187.7  mm     G. Age:  21w 0d        < 3  %    HC/AC:      1.31        1.05 - 1.21  AC:      143.3  mm     G. Age:  19w 4d        < 3  %    FL/BPD:     58.3   %    71 - 87  FL:         30  mm     G. Age:  19w 2d        < 3  %    FL/AC:      20.9   %    20 - 24  Est. FW:     308  gm    0 lb 11 oz    < 10  % ---------------------------------------------------------------------- OB History  Gravidity:    3         Term:   0        Prem:   0        SAB:   1  TOP:          1       Ectopic:  0        Living: 0 ---------------------------------------------------------------------- Gestational Age  LMP:           23w 2d        Date:  05/13/18                 EDD:   02/17/19  U/S Today:     20w 3d                                        EDD:   03/09/19  Best:          23w 2d     Det. By:  LMP  (05/13/18)  EDD:   02/17/19 ---------------------------------------------------------------------- Anatomy  Cranium:               Appears normal         Aortic Arch:            Appears normal  Cavum:                 Appears normal         Ductal Arch:            Appears normal  Ventricles:            Appears normal         Diaphragm:              Appears normal  Choroid Plexus:        Appears normal         Stomach:                Appears normal, left                                                                        sided  Cerebellum:            Appears normal         Abdomen:                Appears normal  Posterior Fossa:       Appears normal         Abdominal Wall:         Previously seen  Nuchal Fold:           Previously seen        Cord Vessels:           Previously  seen  Face:                  Orbits and profile     Kidneys:                Appear normal                         previously seen  Lips:                  Previously seen        Bladder:                Appears normal  Thoracic:              Appears normal         Spine:                  Appears normal  Heart:                 Appears normal         Upper Extremities:      Previously seen                         (4CH, axis, and situs  RVOT:                  Appears normal  Lower Extremities:      Previously seen  LVOT:                  Appears normal  Other:  Heels previously visualized. ---------------------------------------------------------------------- Doppler - Fetal Vessels  Umbilical Artery                                                            ADFV    RDFV                                                              Yes     Yes ---------------------------------------------------------------------- Cervix Uterus Adnexa  Cervix  Length:            3.7  cm.  Normal appearance by transabdominal scan. ---------------------------------------------------------------------- Comments  U/S images reviewed. Findings reviewed with patient.  Decreased fetal growth is noted.   U/A dopplers reveal AEDF.  Patient is being followed secondary to unexplained elevated  MS-AFP.  General counseling was then performed.  The association of  elevated MSAFP with Spina Bifida, multiple gestations, fetal-  maternal bleeding, oligohydramnios, abdominal wall defects,  Congenital [place] Nephrosis, intrauterine fetal demise and  more advanced gestational age was explained.  Prognosis  was discussed as well.   Unexplained elevations of MS-AFP  have been associated with IUGR (20-30%) and stillbirth (1-  3%).  General counseling was then performed regarding abnormal  umbilical artery dopplers.  Resistance to flow in the umbilical  artery may be measured by umbilical artery doppler  evaluation. Absent end-diastolic and reverse  end-diastolic  flow indicate severe resistance and increased perinatal  morbidity and mortality.  If this occurs before 34 weeks and  BPP assessment allows, Celestone for fetal lung maturation  should be given.  Low Dose Aspirin 81 mg QD may helpful in  reducing umbilical artery resistance in milder cases.  I  discussed the benefits of Celestone.  These are typically  given at 24 weeks.  I did mention the possibility of receiving  them at 23 weeks after NICU consultation.  In the case of reverse end-diastolic flow (and frequently  absent end-diastolic flow) with good BPP assessment,  delivery should occur 48 hours after the first dose of  Celestone.  Patient should remain on continuous fetal  monitoring during this period with immediate, delivery should  fetal deterioration occur.  BP - Lt - 150/103; Rt. 154/92.  Patient reports headaches  three times a week which generally responds to Tylenol.  These headaches preceded the pregnancy.  Patient has  been taking Procardia XL 30 mg QD which was increased to  BID yesterday (not yet taken).  Case discussed with Faculty [facility] back-up   Patient is to go  to MAU for evaluation.  Questions answered.  30 minutes spent face to face with patient.  Recommendations: 1) To MAU for evaluation 2) NICU  consultation while in MAU (patient's desire)  3) Twice weekly  UA dopplers and FH check  4) Celestone @ 24 weeks if not  desired by patient before them 5) Serial U/S for growth every  2 -  3 weeks for fetal growth ---------------------------------------------------------------------- Recommendations   1) To MAU for evaluation 2) NICU consultation while in MAU  (patient's desire)  3) Twice weekly UA dopplers and FH check  4) Celestone @ 24 weeks if not desired by patient before  them 5) Serial U/S for growth every 2 -3 weeks for fetal  growth ----------------------------------------------------------------------               Patsi Searsobert L Jacobson, MD Electronically Signed Final Report    10/23/2018 06:16 pm ----------------------------------------------------------------------   MAU Course  Procedures  MDM Patient has had fioricet, and reports that her headache is now 0/10.   7:26 PM consult with Dr. Jolayne Pantheronstant for plan. She will review chart to make a plan.  - Patient to get BMZ today and again tomorrow.  - NICU not available at this time due to high census/high acuity. Will plan for outpatient NICU consult. Will send message to [facility] so that they can coordinate this for the patient.  - [facility] Patient to keep this appointment as planned.  - Patient to return here tomorrow evening for 2nd BMZ injection.  Assessment and Plan   1. [redacted] weeks gestation of pregnancy   2. Chronic hypertension   3. Pregnancy headache in second trimester   4. Abnormal fetal ultrasound    DC home 2nd Trimester precautions  Pre-eclampsia warning signs  PTL precautions  Fetal kick counts RX: procardia 30mg  XL BID #60  Return to MAU as needed FU with OB as planned  [facility] Information    [facility] up[facility]   Contact information: [address] [place] [id number] [phone]           Thressa ShellerHeather Joie Reamer DNP, CNM  10/23/18  7:26 PM

## 2018-10-23 NOTE — Discharge Instructions (Signed)
Preterm Labor and Birth Information °Pregnancy normally lasts 39-41 weeks. Preterm labor is when labor starts early. It starts before you have been pregnant for 37 whole weeks. °What are the risk factors for preterm labor? °Preterm labor is more likely to occur in women who: °· Have an infection while pregnant. °· Have a cervix that is short. °· Have gone into preterm labor before. °· Have had surgery on their cervix. °· Are younger than age 29. °· Are older than age 35. °· Are African American. °· Are pregnant with two or more babies. °· Take street drugs while pregnant. °· Smoke while pregnant. °· Do not gain enough weight while pregnant. °· Got pregnant right after another pregnancy. °What are the symptoms of preterm labor? °Symptoms of preterm labor include: °· Cramps. The cramps may feel like the cramps some women get during their period. The cramps may happen with watery poop (diarrhea). °· Pain in the belly (abdomen). °· Pain in the lower back. °· Regular contractions or tightening. It may feel like your belly is getting tighter. °· Pressure in the lower belly that seems to get stronger. °· More fluid (discharge) leaking from the vagina. The fluid may be watery or bloody. °· Water breaking. °Why is it important to notice signs of preterm labor? °Babies who are born early may not be fully developed. They have a higher chance for: °· Long-term heart problems. °· Long-term lung problems. °· Trouble controlling body systems, like breathing. °· Bleeding in the brain. °· A condition called cerebral palsy. °· Learning difficulties. °· Death. °These risks are highest for babies who are born before 34 weeks of pregnancy. °How is preterm labor treated? °Treatment depends on: °· How long you were pregnant. °· Your condition. °· The health of your baby. °Treatment may involve: °· Having a stitch (suture) placed in your cervix. When you give birth, your cervix opens so the baby can come out. The stitch keeps the cervix  from opening too soon. °· Staying at the hospital. °· Taking or getting medicines, such as: °? Hormone medicines. °? Medicines to stop contractions. °? Medicines to help the baby’s lungs develop. °? Medicines to prevent your baby from having cerebral palsy. °What should I do if I am in preterm labor? °If you think you are going into labor too soon, call your doctor right away. °How can I prevent preterm labor? °· Do not use any tobacco products. °? Examples of these are cigarettes, chewing tobacco, and e-cigarettes. °? If you need help quitting, ask your doctor. °· Do not use street drugs. °· Do not use any medicines unless you ask your doctor if they are safe for you. °· Talk with your doctor before taking any herbal supplements. °· Make sure you gain enough weight. °· Watch for infection. If you think you might have an infection, get it checked right away. °· If you have gone into preterm labor before, tell your doctor. °This information is not intended to replace advice given to you by your health care provider. Make sure you discuss any questions you have with your health care provider. °Document Released: 01/18/2009 Document Revised: 04/03/2016 Document Reviewed: 03/14/2016 °Elsevier Interactive Patient Education © 2019 Elsevier Inc. ° °

## 2018-10-23 NOTE — MAU Note (Signed)
[facility]  Pt states she has a slight headache now since taking Tylenol this am about 0930.  Denies vision changes, epigastric pain, or swelling. Report called to MAU for further evaluation per Dr. Rafael BihariJacobson/Pickens.  Pt ambulated to MAU with family member.

## 2018-10-24 ENCOUNTER — Other Ambulatory Visit (HOSPITAL_COMMUNITY): Payer: Self-pay | Admitting: *Deleted

## 2018-10-24 ENCOUNTER — Inpatient Hospital Stay (HOSPITAL_COMMUNITY)
Admission: AD | Admit: 2018-10-24 | Discharge: 2018-10-24 | Disposition: A | Discharge status: Home / Self Care | Payer: Medicaid Other | Source: Ambulatory Visit | Attending: Family Medicine | Admitting: Family Medicine

## 2018-10-24 ENCOUNTER — Ambulatory Visit: Payer: BLUE CROSS/BLUE SHIELD | Admitting: *Deleted

## 2018-10-24 VITALS — BP 157/107

## 2018-10-24 DIAGNOSIS — Z3402 Encounter for supervision of normal first pregnancy, second trimester: Secondary | ICD-10-CM

## 2018-10-24 DIAGNOSIS — O10912 Unspecified pre-existing hypertension complicating pregnancy, second trimester: Secondary | ICD-10-CM

## 2018-10-24 DIAGNOSIS — Z7982 Long term (current) use of aspirin: Secondary | ICD-10-CM | POA: Diagnosis not present

## 2018-10-24 DIAGNOSIS — G43909 Migraine, unspecified, not intractable, without status migrainosus: Secondary | ICD-10-CM | POA: Insufficient documentation

## 2018-10-24 DIAGNOSIS — O365920 Maternal care for other known or suspected poor fetal growth, second trimester, not applicable or unspecified: Secondary | ICD-10-CM

## 2018-10-24 DIAGNOSIS — J45909 Unspecified asthma, uncomplicated: Secondary | ICD-10-CM | POA: Diagnosis not present

## 2018-10-24 DIAGNOSIS — Z3A23 23 weeks gestation of pregnancy: Secondary | ICD-10-CM | POA: Insufficient documentation

## 2018-10-24 DIAGNOSIS — O10919 Unspecified pre-existing hypertension complicating pregnancy, unspecified trimester: Secondary | ICD-10-CM

## 2018-10-24 MED ORDER — BETAMETHASONE SOD PHOS & ACET 6 (3-3) MG/ML IJ SUSP
12.0000 mg | Freq: Once | INTRAMUSCULAR | Status: AC
  Administered 2018-10-24: 12 mg via INTRAMUSCULAR
  Filled 2018-10-24: qty 2

## 2018-10-24 NOTE — Consult Note (Signed)
Neonatology consultation:  [facility] to consult with this 27 yo G2 P0 mother with chronic HTN who is now 23.[redacted] wks EGA but has IUGR (est fetal weight about 300 gms), and absent end-diastolic flow. Also with elevated MS-AFP.  Patient has received BMZ x 2 (2nd dose today) and is being treated with anti-hypertensives. She and her mother were aware of the complications of premature birth but did not understand the grim outlook for fetal salvage for this baby. (The patient's mother stated she had twin brothers who had weighed about 2 1/2 pounds and who had done well.)  I spoke with patient and her mother, explaining that although survival was possible at 2323 wks gestation that it might not be possible with a birth weight of only 300 gms. I mentioned the possibility, for example, we might not be able to establish an airway for respiratory support.  I showed them the approximate size of a 300-gm baby.  They then seemed to understand that the baby would probably not survive if he/she were born at this time, but they remain optimistic that the pregnancy will continue and that the baby will grow and do well ultimately.  I acknowledged that the chances for survival were increased by BMZ and encouraged the patient to comply with anti-hypertensive Rx and plans for weekly US to determine fetal growth.  Both the patient and her mother expressed appreciation for my input.  Thank you for consulting neonatology.  Total time 25 minutes - 15 minutes face-to-face  Ladawna Walgren E. Barrie DunkerWimmer, Jr., MD Neonatologist

## 2018-10-24 NOTE — Progress Notes (Signed)
Pt is in office today for blood pressure check. Pt was seen in MAU yesterday for eval after u/s.  BP was elevated at that time.  Rx for Procardia was changed to BID.   Pt denies HA, vision changes, swelling.  Pt states good FM.  Pt is to return to MAU tonight for 2nd Betamethasone injection, NICU consults and determine plan of care.  Chart reviewed with Dr Earlene Plateravis. Pt was [facility] if she becomes symptomatic, [facility]   [facility] in referrals spoke with NICU this morning and they are aware of pt and need for consult.  Pt was offered to see if NICU consult could be done while pt was being seen today and could go over to hospital.  Pt states she is to see them this evening when she goes, does not want to have to go back and forth.    BP (!) 157/107   LMP 05/13/2018 (Exact Date)

## 2018-10-24 NOTE — MAU Provider Note (Signed)
History    CSN: [id number] Arrival date and time: 10/24/18 1926  Chief Complaint  Patient presents with  . Injections   HPI  Dawn Hoffman is a 27yo G2P0010 at 5361w3d with pregnancy complicated by chronic hypertension, asthma, and migraines who presents for 2nd BMZ injection, NICU consult, and was found to have elevated blood pressures. Was seen in MAU yesterday after MFM U/S appointment showed AEDF, IUGR (EFW about 300g) in the setting of elevated AFP with unclear etiology.   Now takes procardia twice a day for elevated blood pressures. Was seen in clinic today for repeat blood pressure check. No changes to medication regimen, BP 150s systolic at that time Has taken twice today, also taking aspirin. Had HELLP labs and UPC collected yesterday which were all within normal limits. States feeling well. Denies headaches, vision changes, abdominal pain. Feeling fetal movement. Denies vaginal bleeding, cramping, discharge.   OB History    Gravida  2   Para      Term      Preterm      AB  1   Living  0     SAB  1   TAB      Ectopic      Multiple      Live Births              Past Medical History:  Diagnosis Date  . Anemia   . Asthma   . Dyspnea   . Headache   . Hypertension     No past surgical history on file.  Family History  Problem Relation Age of Onset  . Alcohol abuse Father   . Drug abuse Father   . Cancer Maternal Grandmother   . Diabetes Maternal Grandfather     Social History   Tobacco Use  . Smoking status: Never Smoker  . Smokeless tobacco: Never Used  Substance Use Topics  . Alcohol use: No  . Drug use: No    Allergies: No Known Allergies  Medications Prior to Admission  Medication Sig Dispense Refill Last Dose  . aspirin EC 81 MG tablet Take 81 mg by mouth daily.   Taking  . Elastic Bandages & Supports (COMFORT FIT MATERNITY SUPP SM) MISC Wear as directed. 1 each 0 Taking  . fexofenadine (ALLEGRA) 180 MG tablet Take 180 mg by  mouth daily.   Taking  . NIFEdipine (PROCARDIA XL) 30 MG 24 hr tablet Take 1 tablet (30 mg total) by mouth 2 (two) times daily. 60 tablet 0   . prenatal vitamin w/FE, FA (PRENATAL 1 + 1) 27-1 MG TABS tablet Take 1 tablet by mouth daily before breakfast. 90 each 4 Taking    Review of Systems  Constitutional: Negative for activity change, diaphoresis and fatigue.  HENT: Negative for congestion.   Eyes: Negative for visual disturbance.  Respiratory: Negative for cough and shortness of breath.   Cardiovascular: Negative for chest pain and palpitations.  Gastrointestinal: Negative for abdominal pain and nausea.  Genitourinary: Negative for dysuria and flank pain.  Skin: Negative for rash.  Neurological: Negative for dizziness and headaches.  Psychiatric/Behavioral: Negative for sleep disturbance. The patient is not nervous/anxious.    Physical Exam   Blood pressure (!) 155/100, pulse 81, temperature 98.1 F (36.7 C), resp. rate 18, height 5' (1.524 m), weight 86.6 kg, last menstrual period 05/13/2018.  Physical Exam  Nursing note and vitals reviewed. Constitutional: She is oriented to person, place, and time. She appears well-developed and well-nourished. No distress.  HENT:  Head: Normocephalic and atraumatic.  Eyes: Conjunctivae and EOM are normal.  Cardiovascular: Normal rate.  Respiratory: No respiratory distress.  Musculoskeletal:        General: No edema.  Neurological: She is alert and oriented to person, place, and time.  Psychiatric: She has a normal mood and affect. Her behavior is normal.   MAU Course  Procedures  MDM -- Difficult to obtain strip, FHTs present, lots of movement noted per RN -- NICU consulted, will come see the patient when available - spoke with patient and her mother, will leave consult note -- given 2nd dose of BMZ  -- consulted with Dr. Shawnie PonsPratt given MFM recommendations for delivery 48 hours after BMZ. However, given EFW of only about 300g, infant not  viable at this time. NICU consultant agreed. Will continue to monitor interval growth and depending on timing of delivery, could consider additional dose of BMZ later in pregnancy.   Assessment and Plan  27yo G2P0010 at 7059w3d with pregnancy complicated by chronic hypertension, asthma, and migraines who presents for 2nd BMZ injection in the setting of newly diagnosed AEDF, decreased fetal growth and unexplained elevated MS-AFP. Discussed MFM recommendations and findings with patient in detail, patient and mother both voiced understanding.  -- encouraged to keep U/S appointments for interval growth (12/23, 12/26, 1/8) -- message sent to clinic to schedule BP check next week and to adjust BP meds prn  -- preterm labor precautions reviewed   Patient and mother voiced understanding of plan. All questions answered prior to discharge.   Dawn StandsLaurel S Lessie Funderburke, DO 10/24/2018, 8:07 PM

## 2018-10-24 NOTE — MAU Note (Signed)
Discussed pt's B/P with Dr Aneta MinsPhillip. Pt should be brought back to room for further evaluation of B/Ps.

## 2018-10-24 NOTE — MAU Note (Signed)
Pt here for second BMZ injection. Denies any problems today. Does report some "heartburn which has picked up in last 2 wks". Takes tums which helps. Takes procardia 30 xl BID and has had both doses today.

## 2018-10-27 ENCOUNTER — Encounter (HOSPITAL_COMMUNITY): Payer: Self-pay

## 2018-10-27 ENCOUNTER — Ambulatory Visit (HOSPITAL_COMMUNITY)
Admission: RE | Admit: 2018-10-27 | Discharge: 2018-10-27 | Disposition: A | Discharge status: Home / Self Care | Payer: BLUE CROSS/BLUE SHIELD | Source: Ambulatory Visit | Attending: Obstetrics | Admitting: Obstetrics

## 2018-10-27 DIAGNOSIS — O10012 Pre-existing essential hypertension complicating pregnancy, second trimester: Secondary | ICD-10-CM

## 2018-10-27 DIAGNOSIS — Z3A23 23 weeks gestation of pregnancy: Secondary | ICD-10-CM | POA: Insufficient documentation

## 2018-10-27 DIAGNOSIS — O365920 Maternal care for other known or suspected poor fetal growth, second trimester, not applicable or unspecified: Secondary | ICD-10-CM | POA: Insufficient documentation

## 2018-10-27 DIAGNOSIS — O99212 Obesity complicating pregnancy, second trimester: Secondary | ICD-10-CM | POA: Diagnosis not present

## 2018-10-27 NOTE — Progress Notes (Signed)
I have reviewed this chart and agree with the RN/CMA assessment and management.    K. Meryl Davis, M.D. Attending [facility] (Faculty Practice)   

## 2018-10-30 ENCOUNTER — Encounter: Payer: BLUE CROSS/BLUE SHIELD | Admitting: Obstetrics

## 2018-10-30 ENCOUNTER — Encounter (HOSPITAL_COMMUNITY): Payer: Self-pay | Admitting: *Deleted

## 2018-10-30 ENCOUNTER — Other Ambulatory Visit: Payer: Self-pay

## 2018-10-30 ENCOUNTER — Encounter (HOSPITAL_COMMUNITY): Payer: Self-pay

## 2018-10-30 ENCOUNTER — Inpatient Hospital Stay (HOSPITAL_COMMUNITY)
Admission: AD | Admit: 2018-10-30 | Discharge: 2018-10-30 | Disposition: A | Discharge status: Home / Self Care | Payer: Medicaid Other | Attending: Obstetrics & Gynecology | Admitting: Obstetrics & Gynecology

## 2018-10-30 ENCOUNTER — Ambulatory Visit (HOSPITAL_BASED_OUTPATIENT_CLINIC_OR_DEPARTMENT_OTHER)
Admission: RE | Admit: 2018-10-30 | Discharge: 2018-10-30 | Disposition: A | Discharge status: Home / Self Care | Payer: Medicaid Other | Source: Ambulatory Visit | Attending: Obstetrics | Admitting: Obstetrics

## 2018-10-30 ENCOUNTER — Other Ambulatory Visit (HOSPITAL_COMMUNITY): Payer: Self-pay | Admitting: *Deleted

## 2018-10-30 DIAGNOSIS — Z3A24 24 weeks gestation of pregnancy: Secondary | ICD-10-CM | POA: Diagnosis not present

## 2018-10-30 DIAGNOSIS — O9989 Other specified diseases and conditions complicating pregnancy, childbirth and the puerperium: Secondary | ICD-10-CM | POA: Diagnosis not present

## 2018-10-30 DIAGNOSIS — I10 Essential (primary) hypertension: Secondary | ICD-10-CM | POA: Diagnosis not present

## 2018-10-30 DIAGNOSIS — R772 Abnormality of alphafetoprotein: Secondary | ICD-10-CM | POA: Diagnosis not present

## 2018-10-30 DIAGNOSIS — O365930 Maternal care for other known or suspected poor fetal growth, third trimester, not applicable or unspecified: Secondary | ICD-10-CM

## 2018-10-30 DIAGNOSIS — Z7982 Long term (current) use of aspirin: Secondary | ICD-10-CM | POA: Diagnosis not present

## 2018-10-30 DIAGNOSIS — O365920 Maternal care for other known or suspected poor fetal growth, second trimester, not applicable or unspecified: Secondary | ICD-10-CM | POA: Insufficient documentation

## 2018-10-30 DIAGNOSIS — O26892 Other specified pregnancy related conditions, second trimester: Secondary | ICD-10-CM | POA: Diagnosis not present

## 2018-10-30 DIAGNOSIS — Z79899 Other long term (current) drug therapy: Secondary | ICD-10-CM | POA: Insufficient documentation

## 2018-10-30 DIAGNOSIS — O99212 Obesity complicating pregnancy, second trimester: Secondary | ICD-10-CM | POA: Insufficient documentation

## 2018-10-30 DIAGNOSIS — O10012 Pre-existing essential hypertension complicating pregnancy, second trimester: Secondary | ICD-10-CM | POA: Insufficient documentation

## 2018-10-30 DIAGNOSIS — O321XX0 Maternal care for breech presentation, not applicable or unspecified: Secondary | ICD-10-CM | POA: Diagnosis not present

## 2018-10-30 DIAGNOSIS — O289 Unspecified abnormal findings on antenatal screening of mother: Secondary | ICD-10-CM

## 2018-10-30 DIAGNOSIS — O162 Unspecified maternal hypertension, second trimester: Secondary | ICD-10-CM | POA: Diagnosis present

## 2018-10-30 DIAGNOSIS — O365990 Maternal care for other known or suspected poor fetal growth, unspecified trimester, not applicable or unspecified: Secondary | ICD-10-CM

## 2018-10-30 LAB — CBC
HCT: 36 % (ref 36.0–46.0)
Hemoglobin: 12.2 g/dL (ref 12.0–15.0)
MCH: 27.1 pg (ref 26.0–34.0)
MCHC: 33.9 g/dL (ref 30.0–36.0)
MCV: 79.8 fL — ABNORMAL LOW (ref 80.0–100.0)
PLATELETS: 287 10*3/uL (ref 150–400)
RBC: 4.51 MIL/uL (ref 3.87–5.11)
RDW: 13.1 % (ref 11.5–15.5)
WBC: 12.4 10*3/uL — AB (ref 4.0–10.5)
nRBC: 0 % (ref 0.0–0.2)

## 2018-10-30 LAB — COMPREHENSIVE METABOLIC PANEL
ALT: 18 U/L (ref 0–44)
ANION GAP: 8 (ref 5–15)
AST: 12 U/L — ABNORMAL LOW (ref 15–41)
Albumin: 3.7 g/dL (ref 3.5–5.0)
Alkaline Phosphatase: 32 U/L — ABNORMAL LOW (ref 38–126)
BUN: 10 mg/dL (ref 6–20)
CALCIUM: 9.3 mg/dL (ref 8.9–10.3)
CHLORIDE: 103 mmol/L (ref 98–111)
CO2: 22 mmol/L (ref 22–32)
Creatinine, Ser: 0.37 mg/dL — ABNORMAL LOW (ref 0.44–1.00)
GFR calc non Af Amer: >60 mL/min (ref >60)
Glucose, Bld: 77 mg/dL (ref 70–99)
POTASSIUM: 3.6 mmol/L (ref 3.5–5.1)
SODIUM: 133 mmol/L — AB (ref 135–145)
Total Bilirubin: 0.5 mg/dL (ref 0.3–1.2)
Total Protein: 7.7 g/dL (ref 6.5–8.1)

## 2018-10-30 LAB — PROTEIN / CREATININE RATIO, URINE
Creatinine, Urine: 92 mg/dL
Protein Creatinine Ratio: 0.14 mg/mg{Cre} (ref 0.00–0.15)
Total Protein, Urine: 13 mg/dL

## 2018-10-30 MED ORDER — NIFEDIPINE ER OSMOTIC RELEASE 60 MG PO TB24: 60.0000 mg | ORAL_TABLET | Freq: Two times a day (BID) | ORAL | 1 refills | Status: DC

## 2018-10-30 NOTE — MAU Note (Signed)
Pt sent to MAU from MFM for elevated blood pressure. Denies any VB or LOF

## 2018-10-30 NOTE — MAU Provider Note (Signed)
Chief Complaint:  Hypertension   First Provider Initiated Contact with Patient 10/30/18 1110     HPI: Dawn Hoffman is a 27 y.o. G2P0010 at 4224w2dwho presents to maternity admissions reporting elevated BPs in MFM today.  Has been followed for this and for early IUGR and reverse dopplers.  Per Dr Judeth CornfieldShankar, may need increase in her meds.  . She reports good fetal movement, denies LOF, vaginal bleeding, vaginal itching/burning, urinary symptoms, h/a, dizziness, n/v, diarrhea, constipation or fever/chills.  She denies headache, visual changes or RUQ abdominal pain.  Hypertension  This is a recurrent problem. The problem has been gradually worsening since onset. Pertinent negatives include no anxiety, blurred vision, chest pain, headaches, malaise/fatigue, peripheral edema or shortness of breath. There are no associated agents to hypertension. Treatments tried: Procardia XL 30mg  bid. There are no compliance problems.     RN Note: Pt sent to MAU from MFM for elevated blood pressure. Denies any VB or LOF  Past Medical History: Past Medical History:  Diagnosis Date  . Anemia   . Asthma   . Dyspnea   . Headache   . Hypertension     Past obstetric history: OB History  Gravida Para Term Preterm AB Living  2       1 0  SAB TAB Ectopic Multiple Live Births  1            # Outcome Date GA Lbr Len/2nd Weight Sex Delivery Anes PTL Lv  2 Current           1 SAB             Past Surgical History: No past surgical history on file.  Family History: Family History  Problem Relation Age of Onset  . Alcohol abuse Father   . Drug abuse Father   . Cancer Maternal Grandmother   . Diabetes Maternal Grandfather     Social History: Social History   Tobacco Use  . Smoking status: Never Smoker  . Smokeless tobacco: Never Used  Substance Use Topics  . Alcohol use: No  . Drug use: No    Allergies: No Known Allergies  Meds:  Medications Prior to Admission  Medication Sig Dispense  Refill Last Dose  . aspirin EC 81 MG tablet Take 81 mg by mouth daily.   Taking  . Docusate Sodium (EQ STOOL SOFTENER PO) Take by mouth.   Taking  . Elastic Bandages & Supports (COMFORT FIT MATERNITY SUPP SM) MISC Wear as directed. 1 each 0 Taking  . fexofenadine (ALLEGRA) 180 MG tablet Take 180 mg by mouth daily.   Taking  . NIFEdipine (PROCARDIA XL) 30 MG 24 hr tablet Take 1 tablet (30 mg total) by mouth 2 (two) times daily. 60 tablet 0 Taking  . prenatal vitamin w/FE, FA (PRENATAL 1 + 1) 27-1 MG TABS tablet Take 1 tablet by mouth daily before breakfast. 90 each 4 Taking    I have reviewed patient's Past Medical Hx, Surgical Hx, Family Hx, Social Hx, medications and allergies.   ROS:  Review of Systems  Constitutional: Negative for malaise/fatigue.  Eyes: Negative for blurred vision.  Respiratory: Negative for shortness of breath.   Cardiovascular: Negative for chest pain.  Neurological: Negative for headaches.   Other systems negative  Physical Exam   Vitals:   10/30/18 1231 10/30/18 1246 10/30/18 1301 10/30/18 1307  BP: (!) 148/105 (!) 148/99 (!) 142/94   Pulse: 72 74 78   Resp:    20  Temp:  98.6 F (37 C)  TempSrc:    Oral  SpO2: 98% 99% 98% 99%   150/103, 150/94, 153/101, 155/98  Constitutional: Well-developed, well-nourished female in no acute distress.  Cardiovascular: normal rate and rhythm Respiratory: normal effort, clear to auscultation bilaterally GI: Abd soft, non-tender, gravid appropriate for gestational age.   No rebound or guarding. MS: Extremities nontender, Trace edema, normal ROM Neurologic: Alert and oriented x 4   DTRs 2+ with no clonus.  GU: Neg CVAT.  PELVIC EXAM:  deferred  FHT:  Baseline 140 , moderate variability, accelerations present, no decelerations Contractions: Rare   Labs: B/Positive/-- (11/14 1009) Results for orders placed or performed during the hospital encounter of 10/30/18 (from the past 24 hour(s))  Protein / creatinine  ratio, urine     Status: None   Collection Time: 10/30/18 10:58 AM  Result Value Ref Range   Creatinine, Urine 92.00 mg/dL   Total Protein, Urine 13 mg/dL   Protein Creatinine Ratio 0.14 0.00 - 0.15 mg/mg[Cre]  CBC     Status: Abnormal   Collection Time: 10/30/18 11:23 AM  Result Value Ref Range   WBC 12.4 (H) 4.0 - 10.5 K/uL   RBC 4.51 3.87 - 5.11 MIL/uL   Hemoglobin 12.2 12.0 - 15.0 g/dL   HCT 96.036.0 45.436.0 - 09.846.0 %   MCV 79.8 (L) 80.0 - 100.0 fL   MCH 27.1 26.0 - 34.0 pg   MCHC 33.9 30.0 - 36.0 g/dL   RDW 11.913.1 14.711.5 - 82.915.5 %   Platelets 287 150 - 400 K/uL   nRBC 0.0 0.0 - 0.2 %  Comprehensive metabolic panel     Status: Abnormal   Collection Time: 10/30/18 11:23 AM  Result Value Ref Range   Sodium 133 (L) 135 - 145 mmol/L   Potassium 3.6 3.5 - 5.1 mmol/L   Chloride 103 98 - 111 mmol/L   CO2 22 22 - 32 mmol/L   Glucose, Bld 77 70 - 99 mg/dL   BUN 10 6 - 20 mg/dL   Creatinine, Ser 5.620.37 (L) 0.44 - 1.00 mg/dL   Calcium 9.3 8.9 - 13.010.3 mg/dL   Total Protein 7.7 6.5 - 8.1 g/dL   Albumin 3.7 3.5 - 5.0 g/dL   AST 12 (L) 15 - 41 U/L   ALT 18 0 - 44 U/L   Alkaline Phosphatase 32 (L) 38 - 126 U/L   Total Bilirubin 0.5 0.3 - 1.2 mg/dL   GFR calc non Af Amer >60 >60 mL/min   GFR calc Af Amer >60 >60 mL/min   Anion gap 8 5 - 15    Imaging:    MAU Course/MDM: I have ordered labs and reviewed results. No evidence of preeclampsia but BPs are trending up NST reviewed, reassuring for GA Consult Dr Erin FullingHarraway-Smith with presentation, exam findings and test results. She recommends  Treatments in MAU included NST, labs Labs are negative for preeclampsia Reviewed recommendation to increase her dose of Procardia XL to 60mg  bid.   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   Pt stable  at time of discharge.  Wynelle Bourgeois CNM, MSN Certified Nurse-Midwife 10/30/2018 11:11 AM

## 2018-10-30 NOTE — MAU Note (Signed)
Pt seen today in [facility] for U/S.  Report called to Ginger Morris in MAU for further evaluation per Dr. Clovis FredricksonShankar/Harraway-Smith.  Pt/Family ambulated to MAU.

## 2018-10-30 NOTE — Discharge Instructions (Signed)
How to Take Your Blood Pressure You can take your blood pressure at home with a machine. You may need to check your blood pressure at home:  To check if you have high blood pressure (hypertension).  To check your blood pressure over time.  To make sure your blood pressure medicine is working. Supplies needed: You will need a blood pressure machine, or monitor. You can buy one at a drugstore or online. When choosing one:  Choose one with an arm cuff.  Choose one that wraps around your upper arm. Only one finger should fit between your arm and the cuff.  Do not choose one that measures your blood pressure from your wrist or finger. Your doctor can suggest a monitor. How to prepare Avoid these things for 30 minutes before checking your blood pressure:  Drinking caffeine.  Drinking alcohol.  Eating.  Smoking.  Exercising. Five minutes before checking your blood pressure:  Pee.  Sit in a dining chair. Avoid sitting in a soft couch or armchair.  Be quiet. Do not talk. How to take your blood pressure Follow the instructions that came with your machine. If you have a digital blood pressure monitor, these may be the instructions: 1. Sit up straight. 2. Place your feet on the floor. Do not cross your ankles or legs. 3. Rest your left arm at the level of your heart. You may rest it on a table, desk, or chair. 4. Pull up your shirt sleeve. 5. Wrap the blood pressure cuff around the upper part of your left arm. The cuff should be 1 inch (2.5 cm) above your elbow. It is best to wrap the cuff around bare skin. 6. Fit the cuff snugly around your arm. You should be able to place only one finger between the cuff and your arm. 7. Put the cord inside the groove of your elbow. 8. Press the power button. 9. Sit quietly while the cuff fills with air and loses air. 10. Write down the numbers on the screen. 11. Wait 2-3 minutes and then repeat steps 1-10. What do the numbers mean? Two  numbers make up your blood pressure. The first number is called systolic pressure. The second is called diastolic pressure. An example of a blood pressure reading is "120 over 80" (or 120/80). If you are an adult and do not have a medical condition, use this guide to find out if your blood pressure is normal: Normal  First number: below 120.  Second number: below 80. Elevated  First number: 120-129.  Second number: below 80. Hypertension stage 1  First number: 130-139.  Second number: 80-89. Hypertension stage 2  First number: 140 or above.  Second number: 90 or above. Your blood pressure is above normal even if only the top or bottom number is above normal. Follow these instructions at home:  Check your blood pressure as often as your doctor tells you to.  Take your monitor to your next doctor's appointment. Your doctor will: ? Make sure you are using it correctly. ? Make sure it is working right.  Make sure you understand what your blood pressure numbers should be.  Tell your doctor if your medicines are causing side effects. Contact a doctor if:  Your blood pressure keeps being high. Get help right away if:  Your first blood pressure number is higher than 180.  Your second blood pressure number is higher than 120. This information is not intended to replace advice given to you by your health   care provider. Make sure you discuss any questions you have with your health care provider. Document Released: 10/04/2008 Document Revised: 09/19/2016 Document Reviewed: 03/30/2016 Elsevier Interactive Patient Education  2019 Elsevier Inc.  Hypertension During Pregnancy  Hypertension is also called high blood pressure. High blood pressure means that the force of your blood moving in your body is too strong. When you are pregnant, this condition should be watched carefully. It can cause problems for you and your baby. Follow these instructions at home: Eating and  drinking   Drink enough fluid to keep your pee (urine) pale yellow.  Avoid caffeine. Lifestyle  Do not use any products that contain nicotine or tobacco, such as cigarettes and e-cigarettes. If you need help quitting, ask your doctor.  Do not use alcohol or drugs.  Avoid stress.  Rest and get plenty of sleep. General instructions  Take over-the-counter and prescription medicines only as told by your doctor.  While lying down, lie on your left side. This keeps pressure off your major blood vessels.  While sitting or lying down, raise (elevate) your feet. Try putting some pillows under your lower legs.  Exercise regularly. Ask your doctor what kinds of exercise are best for you.  Keep all prenatal and follow-up visits as told by your doctor. This is important. Contact a doctor if:  You have symptoms that your doctor told you to watch for, such as: ? Throwing up (vomiting). ? Feeling sick to your stomach (nausea). ? Headache. Get help right away if you have:  Very bad belly pain that does not get better with treatment.  A very bad headache that does not get better.  Throwing up that does not get better with treatment.  Sudden, fast weight gain.  Sudden swelling in your hands, ankles, or face.  Bleeding from your vagina.  Blood in your pee.  Fewer movements from your baby than usual.  Blurry vision.  Double vision.  Muscle twitching.  Sudden muscle tightening (spasms).  Trouble breathing.  Blue fingernails or lips. Summary  Hypertension is also called high blood pressure. High blood pressure means that the force of your blood moving in your body is too strong.  When you are pregnant, this condition should be watched carefully. It can cause problems for you and your baby.  Get help right away if you have symptoms that your doctor told you to watch for. This information is not intended to replace advice given to you by your health care provider. Make sure  you discuss any questions you have with your health care provider. Document Released: 11/24/2010 Document Revised: 10/08/2017 Document Reviewed: 07/03/2016 Elsevier Interactive Patient Education  2019 [employer]

## 2018-11-03 ENCOUNTER — Encounter: Payer: BLUE CROSS/BLUE SHIELD | Admitting: Obstetrics & Gynecology

## 2018-11-03 ENCOUNTER — Ambulatory Visit (HOSPITAL_BASED_OUTPATIENT_CLINIC_OR_DEPARTMENT_OTHER)
Admission: RE | Admit: 2018-11-03 | Discharge: 2018-11-03 | Disposition: A | Discharge status: Home / Self Care | Payer: Medicaid Other | Source: Ambulatory Visit | Attending: Obstetrics | Admitting: Obstetrics

## 2018-11-03 ENCOUNTER — Encounter (HOSPITAL_COMMUNITY): Payer: Self-pay

## 2018-11-03 DIAGNOSIS — O289 Unspecified abnormal findings on antenatal screening of mother: Secondary | ICD-10-CM

## 2018-11-03 DIAGNOSIS — Z3A24 24 weeks gestation of pregnancy: Secondary | ICD-10-CM

## 2018-11-03 DIAGNOSIS — O365920 Maternal care for other known or suspected poor fetal growth, second trimester, not applicable or unspecified: Secondary | ICD-10-CM | POA: Diagnosis not present

## 2018-11-03 DIAGNOSIS — Z3402 Encounter for supervision of normal first pregnancy, second trimester: Secondary | ICD-10-CM

## 2018-11-03 DIAGNOSIS — O99212 Obesity complicating pregnancy, second trimester: Secondary | ICD-10-CM

## 2018-11-03 DIAGNOSIS — O10012 Pre-existing essential hypertension complicating pregnancy, second trimester: Secondary | ICD-10-CM

## 2018-11-06 ENCOUNTER — Encounter (HOSPITAL_COMMUNITY): Payer: Self-pay

## 2018-11-06 ENCOUNTER — Ambulatory Visit (HOSPITAL_COMMUNITY): Admission: RE | Admit: 2018-11-06 | Payer: Medicaid Other | Source: Ambulatory Visit

## 2018-11-06 ENCOUNTER — Ambulatory Visit (HOSPITAL_COMMUNITY)
Admission: RE | Admit: 2018-11-06 | Discharge: 2018-11-06 | Disposition: A | Discharge status: Home / Self Care | Payer: Medicaid Other | Source: Ambulatory Visit | Attending: Maternal and Fetal Medicine | Admitting: Maternal and Fetal Medicine

## 2018-11-06 DIAGNOSIS — O99212 Obesity complicating pregnancy, second trimester: Secondary | ICD-10-CM

## 2018-11-06 DIAGNOSIS — O10012 Pre-existing essential hypertension complicating pregnancy, second trimester: Secondary | ICD-10-CM

## 2018-11-06 DIAGNOSIS — O365920 Maternal care for other known or suspected poor fetal growth, second trimester, not applicable or unspecified: Secondary | ICD-10-CM

## 2018-11-06 DIAGNOSIS — Z3402 Encounter for supervision of normal first pregnancy, second trimester: Secondary | ICD-10-CM | POA: Insufficient documentation

## 2018-11-06 DIAGNOSIS — O289 Unspecified abnormal findings on antenatal screening of mother: Secondary | ICD-10-CM | POA: Diagnosis not present

## 2018-11-06 DIAGNOSIS — Z3A25 25 weeks gestation of pregnancy: Secondary | ICD-10-CM

## 2018-11-10 ENCOUNTER — Encounter (HOSPITAL_COMMUNITY): Payer: Self-pay

## 2018-11-10 ENCOUNTER — Other Ambulatory Visit: Payer: Self-pay

## 2018-11-10 ENCOUNTER — Ambulatory Visit (HOSPITAL_COMMUNITY): Payer: BLUE CROSS/BLUE SHIELD

## 2018-11-10 ENCOUNTER — Encounter (HOSPITAL_COMMUNITY): Payer: Self-pay | Admitting: *Deleted

## 2018-11-10 ENCOUNTER — Ambulatory Visit (HOSPITAL_BASED_OUTPATIENT_CLINIC_OR_DEPARTMENT_OTHER)
Admission: RE | Admit: 2018-11-10 | Discharge: 2018-11-10 | Disposition: A | Discharge status: Home / Self Care | Payer: Medicaid Other | Source: Ambulatory Visit | Attending: Obstetrics | Admitting: Obstetrics

## 2018-11-10 ENCOUNTER — Observation Stay (HOSPITAL_COMMUNITY)
Admission: AD | Admit: 2018-11-10 | Discharge: 2018-11-12 | Disposition: A | Discharge status: Home / Self Care | Payer: Medicaid Other | Attending: Obstetrics and Gynecology | Admitting: Obstetrics and Gynecology

## 2018-11-10 DIAGNOSIS — I10 Essential (primary) hypertension: Secondary | ICD-10-CM | POA: Diagnosis present

## 2018-11-10 DIAGNOSIS — O283 Abnormal ultrasonic finding on antenatal screening of mother: Principal | ICD-10-CM | POA: Insufficient documentation

## 2018-11-10 DIAGNOSIS — Z3402 Encounter for supervision of normal first pregnancy, second trimester: Secondary | ICD-10-CM | POA: Insufficient documentation

## 2018-11-10 DIAGNOSIS — O365920 Maternal care for other known or suspected poor fetal growth, second trimester, not applicable or unspecified: Secondary | ICD-10-CM

## 2018-11-10 DIAGNOSIS — Z3A25 25 weeks gestation of pregnancy: Secondary | ICD-10-CM

## 2018-11-10 DIAGNOSIS — O9989 Other specified diseases and conditions complicating pregnancy, childbirth and the puerperium: Secondary | ICD-10-CM | POA: Diagnosis present

## 2018-11-10 DIAGNOSIS — O99212 Obesity complicating pregnancy, second trimester: Secondary | ICD-10-CM | POA: Diagnosis not present

## 2018-11-10 DIAGNOSIS — O10012 Pre-existing essential hypertension complicating pregnancy, second trimester: Secondary | ICD-10-CM

## 2018-11-10 DIAGNOSIS — O10019 Pre-existing essential hypertension complicating pregnancy, unspecified trimester: Secondary | ICD-10-CM

## 2018-11-10 DIAGNOSIS — Z79899 Other long term (current) drug therapy: Secondary | ICD-10-CM | POA: Insufficient documentation

## 2018-11-10 DIAGNOSIS — O289 Unspecified abnormal findings on antenatal screening of mother: Secondary | ICD-10-CM | POA: Diagnosis not present

## 2018-11-10 DIAGNOSIS — Z3A26 26 weeks gestation of pregnancy: Secondary | ICD-10-CM | POA: Diagnosis not present

## 2018-11-10 DIAGNOSIS — Z7982 Long term (current) use of aspirin: Secondary | ICD-10-CM | POA: Insufficient documentation

## 2018-11-10 DIAGNOSIS — O162 Unspecified maternal hypertension, second trimester: Secondary | ICD-10-CM | POA: Diagnosis not present

## 2018-11-10 HISTORY — DX: Anxiety disorder, unspecified: F41.9

## 2018-11-10 HISTORY — DX: Abnormal ultrasonic finding on antenatal screening of mother: O28.3

## 2018-11-10 MED ORDER — DOCUSATE SODIUM 100 MG PO CAPS
100.0000 mg | ORAL_CAPSULE | Freq: Every day | ORAL | Status: DC
  Filled 2018-11-10: qty 1

## 2018-11-10 MED ORDER — ZOLPIDEM TARTRATE 5 MG PO TABS: 5.0000 mg | ORAL_TABLET | Freq: Every evening | ORAL | Status: DC | PRN

## 2018-11-10 MED ORDER — PRENATAL MULTIVITAMIN CH
1.0000 | ORAL_TABLET | Freq: Every day | ORAL | Status: DC
  Administered 2018-11-11 – 2018-11-12 (×2): 1 via ORAL
  Filled 2018-11-10 (×2): qty 1

## 2018-11-10 MED ORDER — ACETAMINOPHEN 325 MG PO TABS
650.0000 mg | ORAL_TABLET | ORAL | Status: DC | PRN
  Administered 2018-11-10: 650 mg via ORAL
  Filled 2018-11-10: qty 2

## 2018-11-10 MED ORDER — CALCIUM CARBONATE ANTACID 500 MG PO CHEW: 2.0000 | CHEWABLE_TABLET | ORAL | Status: DC | PRN

## 2018-11-10 NOTE — ED Notes (Signed)
Report called to Raynelle Fanning, RN on [facility] specialty unit.  Pt to return for admission by 5pm.

## 2018-11-11 ENCOUNTER — Observation Stay (HOSPITAL_BASED_OUTPATIENT_CLINIC_OR_DEPARTMENT_OTHER): Payer: Medicaid Other

## 2018-11-11 ENCOUNTER — Observation Stay (HOSPITAL_COMMUNITY): Payer: Medicaid Other

## 2018-11-11 DIAGNOSIS — O365920 Maternal care for other known or suspected poor fetal growth, second trimester, not applicable or unspecified: Secondary | ICD-10-CM

## 2018-11-11 DIAGNOSIS — O99212 Obesity complicating pregnancy, second trimester: Secondary | ICD-10-CM | POA: Diagnosis not present

## 2018-11-11 DIAGNOSIS — Z3A26 26 weeks gestation of pregnancy: Secondary | ICD-10-CM

## 2018-11-11 DIAGNOSIS — O289 Unspecified abnormal findings on antenatal screening of mother: Secondary | ICD-10-CM | POA: Diagnosis not present

## 2018-11-11 DIAGNOSIS — O283 Abnormal ultrasonic finding on antenatal screening of mother: Secondary | ICD-10-CM

## 2018-11-11 DIAGNOSIS — O10012 Pre-existing essential hypertension complicating pregnancy, second trimester: Secondary | ICD-10-CM

## 2018-11-11 LAB — TYPE AND SCREEN
ABO/RH(D): B POS
ANTIBODY SCREEN: NEGATIVE

## 2018-11-11 LAB — CBC
HCT: 31.7 % — ABNORMAL LOW (ref 36.0–46.0)
Hemoglobin: 10.7 g/dL — ABNORMAL LOW (ref 12.0–15.0)
MCH: 27 pg (ref 26.0–34.0)
MCHC: 33.8 g/dL (ref 30.0–36.0)
MCV: 80.1 fL (ref 80.0–100.0)
Platelets: 243 10*3/uL (ref 150–400)
RBC: 3.96 MIL/uL (ref 3.87–5.11)
RDW: 13.1 % (ref 11.5–15.5)
WBC: 11.4 10*3/uL — ABNORMAL HIGH (ref 4.0–10.5)
nRBC: 0 % (ref 0.0–0.2)

## 2018-11-11 LAB — ABO/RH: ABO/RH(D): B POS

## 2018-11-11 MED ORDER — NIFEDIPINE ER OSMOTIC RELEASE 30 MG PO TB24
60.0000 mg | ORAL_TABLET | Freq: Two times a day (BID) | ORAL | Status: DC
  Administered 2018-11-11 – 2018-11-12 (×4): 60 mg via ORAL
  Filled 2018-11-11 (×4): qty 2

## 2018-11-11 MED ORDER — ASPIRIN EC 81 MG PO TBEC
81.0000 mg | DELAYED_RELEASE_TABLET | Freq: Every day | ORAL | Status: DC
  Administered 2018-11-11 – 2018-11-12 (×2): 81 mg via ORAL
  Filled 2018-11-11 (×3): qty 1

## 2018-11-11 NOTE — H&P (Signed)
FACULTY PRACTICE ANTEPARTUM ADMISSION HISTORY AND PHYSICAL NOTE   History of Present Illness: Dawn Hoffman is a 28 y.o. G2P0010 at [redacted]w[redacted]d admitted for reverse end diastolic flow on [place] today.   Patient reports the fetal movement as active. Patient reports uterine contraction  activity as none. Patient reports  vaginal bleeding as none. Patient describes fluid per vagina as None. Fetal presentation is unsure.  Patient Active Problem List   Diagnosis Date Noted  . Abnormal fetal ultrasound 11/10/2018  . Encounter for supervision of normal first pregnancy in second trimester 09/17/2018  . MIGRAINE, UNSPEC., W/O INTRACTABLE MIGRAINE 01/02/2007  . HYPERTENSION, BENIGN SYSTEMIC 01/02/2007  . RHINITIS, ALLERGIC 01/02/2007  . ASTHMA, UNSPECIFIED 01/02/2007    Past Medical History:  Diagnosis Date  . Anemia   . Anxiety   . Asthma   . Dyspnea   . Headache   . Hypertension     History reviewed. No pertinent surgical history.  OB History  Gravida Para Term Preterm AB Living  2       1 0  SAB TAB Ectopic Multiple Live Births  1            # Outcome Date GA Lbr Len/2nd Weight Sex Delivery Anes PTL Lv  2 Current           1 SAB             Social History   Socioeconomic History  . Marital status: Single    Spouse name: Not on file  . Number of children: Not on file  . Years of education: Not on file  . Highest education level: Not on file  Occupational History  . Occupation: Magazine features editor: [employer]  Social Needs  . Financial resource strain: Not hard at all  . Food insecurity:    Worry: Never true    Inability: Not on file  . Transportation needs:    Medical: No    Non-medical: No  Tobacco Use  . Smoking status: Never Smoker  . Smokeless tobacco: Never Used  Substance and Sexual Activity  . Alcohol use: No  . Drug use: No  . Sexual activity: Yes  Lifestyle  . Physical activity:    Days per week: Not on file    Minutes per  session: Not on file  . Stress: Not on file  Relationships  . Social connections:    Talks on phone: Not on file    Gets together: Not on file    Attends religious service: Not on file    Active member of club or organization: Not on file    Attends meetings of clubs or organizations: Not on file    Relationship status: Not on file  Other Topics Concern  . Not on file  Social History Narrative  . Not on file    Family History  Problem Relation Age of Onset  . Alcohol abuse Father   . Drug abuse Father   . Cancer Maternal Grandmother   . Diabetes Maternal Grandfather     No Known Allergies  Medications Prior to Admission  Medication Sig Dispense Refill Last Dose  . aspirin EC 81 MG tablet Take 81 mg by mouth daily.   Taking  . Docusate Sodium (EQ STOOL SOFTENER PO) Take by mouth.   Taking  . Elastic Bandages & Supports (COMFORT FIT MATERNITY SUPP SM) MISC Wear as directed. 1 each 0 Taking  . fexofenadine (ALLEGRA) 180 MG tablet  Take 180 mg by mouth daily.   Taking  . NIFEdipine (PROCARDIA XL) 60 MG 24 hr tablet Take 1 tablet (60 mg total) by mouth 2 (two) times daily. 60 tablet 1 Taking  . prenatal vitamin w/FE, FA (PRENATAL 1 + 1) 27-1 MG TABS tablet Take 1 tablet by mouth daily before breakfast. 90 each 4 Taking    Review of Systems - History obtained from the patient  Vitals:  BP (!) 146/98 (BP Location: Left Arm)   Pulse 87   Temp 97.8 F (36.6 C) (Oral)   Resp 19   Ht 5' (1.524 m)   Wt 85.7 kg   LMP 05/13/2018 (Exact Date)   SpO2 98%   BMI 36.90 kg/m  Physical Examination: CONSTITUTIONAL: Well-developed, well-nourished female in no acute distress.  HENT:  Normocephalic, atraumatic, External right and left ear normal. Oropharynx is clear and moist EYES: Conjunctivae and EOM are normal. Pupils are equal, round, and reactive to light. No scleral icterus.  NECK: Normal range of motion, supple, no masses SKIN: Skin is warm and dry. No rash noted. Not diaphoretic.  No erythema. No pallor. NEUROLGIC: Alert and oriented to person, place, and time. Normal reflexes, muscle tone coordination. No cranial nerve deficit noted. PSYCHIATRIC: Normal mood and affect. Normal behavior. Normal judgment and thought content. CARDIOVASCULAR: Normal heart rate noted, regular rhythm RESPIRATORY: Effort and breath sounds normal, no problems with respiration noted ABDOMEN: Soft, nontender, nondistended, gravid. MUSCULOSKELETAL: Normal range of motion. No edema and no tenderness. 2+ distal pulses.  Cervix:deferred Membranes:intact Fetal Monitoring:Baseline: 140 bpm, Variability: Good {> 6 bpm), Accelerations: Non-reactive but appropriate for gestational age and Decelerations: Absent Tocometer: Flat  Labs:  Results for orders placed or performed during the hospital encounter of 11/10/18 (from the past 24 hour(s))  CBC   Collection Time: 11/10/18 11:42 PM  Result Value Ref Range   WBC 11.4 (H) 4.0 - 10.5 K/uL   RBC 3.96 3.87 - 5.11 MIL/uL   Hemoglobin 10.7 (L) 12.0 - 15.0 g/dL   HCT 30.0 (L) 76.2 - 26.3 %   MCV 80.1 80.0 - 100.0 fL   MCH 27.0 26.0 - 34.0 pg   MCHC 33.8 30.0 - 36.0 g/dL   RDW 33.5 45.6 - 25.6 %   Platelets 243 150 - 400 K/uL   nRBC 0.0 0.0 - 0.2 %  Type and screen [facility] [place]   Collection Time: 11/10/18 11:42 PM  Result Value Ref Range   ABO/RH(D) B POS    Antibody Screen NEG    Sample Expiration      11/13/2018 Performed at [facility], [address] [place], [place] [address]     Assessment and Plan: Patient Active Problem List   Diagnosis Date Noted  . Abnormal fetal ultrasound 11/10/2018  . Encounter for supervision of normal first pregnancy in second trimester 09/17/2018  . MIGRAINE, UNSPEC., W/O INTRACTABLE MIGRAINE 01/02/2007  . HYPERTENSION, BENIGN SYSTEMIC 01/02/2007  . RHINITIS, ALLERGIC 01/02/2007  . ASTHMA, UNSPECIFIED 01/02/2007   Had long discussion with patient and patient's mother regarding  survivability in neonate weighing 300 gms, that likelihood of surviving is quite low due to size, despite gestational age. Reviewed need for further imaging and growth scan (scheduled for 11/12/18) and that will further inform next steps. Reviewed that if fetus is delivered at this point, she would most likely need classical incision, which would necessitate all subsequent deliveries to be done by c-section. Patient and her mother express concerns about being managed as  an inpatient with job concerns, will review plan for management in am with day team and MFM. They are agreeable to this plan, answered all questions.   Admit to Antenatal S/p BTMZ NST daily   Routine antenatal care  Baldemar LenisK. Meryl Otillia Cordone, M.D. Attending [facility](Faculty Practice)

## 2018-11-11 NOTE — Progress Notes (Signed)
Patient ID: Dawn Hoffman, female   DOB: [birth year], 28 y.o.   MRN: [id number] FACULTY PRACTICE ANTEPARTUM(COMPREHENSIVE) NOTE  Dawn Hoffman is a 28 y.o. G2P0010 at [redacted]w[redacted]d by best clinical estimate who is admitted for REDF in fetus with severe IUGR.   Fetal presentation is breech. Length of Stay:  0  Days  Subjective: Feels well. No subjective complaints. Patient reports the fetal movement as active. Patient reports uterine contraction  activity as none. Patient reports  vaginal bleeding as none. Patient describes fluid per vagina as None.  Vitals:  Blood pressure 133/88, pulse 74, temperature 98.6 F (37 C), temperature source Oral, resp. rate 16, height 5' (1.524 m), weight 85.7 kg, last menstrual period 05/13/2018, SpO2 97 %. Physical Examination:  General appearance - alert, well appearing, and in no distress Chest - normal effort Abdomen - gravid, non-tender Fundal Height:  size less than dates Extremities: Homans sign is negative, no sign of DVT  Membranes:intact  Fetal Monitoring:  Baseline: 150 bpm, Variability: Good {> 6 bpm), Accelerations: Reactive and Decelerations: Absent  Labs:  Results for orders placed or performed during the hospital encounter of 11/10/18 (from the past 24 hour(s))  CBC   Collection Time: 11/10/18 11:42 PM  Result Value Ref Range   WBC 11.4 (H) 4.0 - 10.5 K/uL   RBC 3.96 3.87 - 5.11 MIL/uL   Hemoglobin 10.7 (L) 12.0 - 15.0 g/dL   HCT 17.0 (L) 01.7 - 49.4 %   MCV 80.1 80.0 - 100.0 fL   MCH 27.0 26.0 - 34.0 pg   MCHC 33.8 30.0 - 36.0 g/dL   RDW 49.6 75.9 - 16.3 %   Platelets 243 150 - 400 K/uL   nRBC 0.0 0.0 - 0.2 %  Type and screen [facility] [place]   Collection Time: 11/10/18 11:42 PM  Result Value Ref Range   ABO/RH(D) B POS    Antibody Screen NEG    Sample Expiration      11/13/2018 Performed at [facility], [address], [place], [place] [address]   ABO/Rh   Collection Time: 11/10/18 11:42 PM   Result Value Ref Range   ABO/RH(D)      B POS Performed at [facility], [address], [place], [place] [address]     Medications:  Scheduled . aspirin EC  81 mg Oral Daily  . docusate sodium  100 mg Oral Daily  . NIFEdipine  60 mg Oral BID  . prenatal multivitamin  1 tablet Oral Q1200   I have reviewed the patient's current medications.  ASSESSMENT: Active Problems:   HYPERTENSION, BENIGN SYSTEMIC   Abnormal fetal ultrasound   PLAN: Severe IUGR with abnormal doppler with persistent REDF. She has reassuring NST today. Lengthy discussion with patient for 1.5 hours today explaining in detail IUGR, causes, abnormal dopplers, diagrams provided, significance of reverse flow. Benefits of inpatient vs. Outpatient monitoring. For growth initially on 1/8. She has a lot of concern about losing her job and her FMLA and will move the growth to today per MFM with formal counseling and advising of plan of care with or without NICU input as well following the growth scan. Discussed her fetal ECHO results as well.  Reva Bores, MD 11/11/2018,11:43 AM

## 2018-11-11 NOTE — Consult Note (Signed)
Follow-up MFM consultation  Maternal-Fetal Medicine Name: Dawn Hoffman MRN: [id number] Requesting Provider: Tinnie Gensanya Pratt, MD   Dawn Hoffman, G2 P0 at 26-weeks' gestation, is admitted with severe fetal growth restriction and abnormal umbilical artery (UA) Doppler studies.  Fetal growth restriction was suspected at 20 weeks because of lagging biometry. At 23-eeks, the estimated fetal weight was 308 grams and absent-end-diastolic flow (AEDF) was seen on UA Doppler. Patient had MFM consultation (Dr. Perry MountJacobson) and twice-weekly Doppler studies were planned. She also had neonatal consultation.  Of note, serum screening showed increased MSAFP (3.03 MoM). On cell-free fetal DNA screening, the risks for fetal aneuploidies are not increased.   Past medical history: Chronic hypertension since age 89. Patient was taking HCTZ. She takes nifedipine XL 60 mg twice daily now and her blood pressures are well controlled.  She had asthma in childhood. She does not have any other chronic medical conditions.  PSH: Nil of note. Medications: Prenatal vitamins, nifedipine XL 60 mg, aspirin. Allergies: NKDA. Social: Denies tobacco or drug or alcohol use. Her partner is an Tree surgeonAfrican American and he is in good health. 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 Family: Parents do not have hypertension. No history of venous thromboembolism in the family. Ob history: 1 early SAB 7 years ago.  Gyn history: Previous menstrual cycles were regular. No history of breast disease. No history of abnormal Pap smears or cervical surgeries.  Review of systems: No severe headaches or visual disturbances or right upper quadrant pain or vaginal bleeding. No nausea or vomiting or diarrhea. No chest pain or palpitations. No abdominal or joint pains. No recurrent UTI.  Patient is comfortably sitting up; not in distress. BP 133-145/88-102 mm Hg. NST: Reactive (10x10)  Labs: Hb 10.7, Hct 31.7, PLT 243, WBC 11.4,  AST 12, ALT 18, creatinine 0.37, protein/creatinine ratio 0.14, blood type B positive.  On today's ultrasound, the estimated fetal weight is 450 grams (less than the 10th percentile). Symmetrical growth restriction is seen. Amniotic fluid is normal and good fetal activity is seen. Umbilical artery Doppler showed intermittent reversed-end-diastolic flow (REDF). Ductus venosus Doppler is normal.  I counseled the patient on the following (her mother was present at counseling): Early-onset severe fetal growth restriction (IUGR): -Explained the possible causes of IUGR. Most-likely cause is placental insufficiency. Other causes include fetal chromosomal or genetic conditions. Infection is unlikely in her case.  -Discussed the role of amniocentesis and microarray analysis that can detect chromosomal and some (not all) genetic syndromes. Explained procedure and its possible complications. Patient opted not to have amniocentesis.  -Informed the patient of limitations of ultrasound in accurately-estimating fetal weights.   -Discussed the significance of umbilical artery Doppler, its progression and prognosis related to the findings. REDF carries a very high perinatal mortality and morbidity. REDF is also associated with poor neurodevelopmental outcomes. I discussed her Doppler findings with help of ultrasound images.  -Prognosis (survival and neurodevelopmental outcomes) in the newborn is related to birth weight and gestational age.   -Difficult to determine the optimal time of delivery in early IUGR. All modalities of antenatal testing have limitations in detecting fetal compromise. Frequency of antenatal testing including Doppler studies varies and our standard practice here is to perform Doppler studies 3 times a week.   -Delivery based on abnormal Doppler or NST does not guarantee good outcomes in the newborn. Patient is more-likely to have classical cesarean delivery.  -Patient is keen to go back to  her work and cannot afford (financial) to  miss her employment.   -We discussed the following options:  a) No fetal surveillance till 28 weeks and resume surveillance. Stillbirth can occur during this period,  b) Inpatient management with twice-daily NST and Doppler studies every other day (my recommendation). I do not recommend continuous monitoring. Increased surveillance with NST can identify fetal compromise more often,  c) Outpatient management with Doppler and NST on [facility]Wed-Fri at [facility]. She is likely to miss daily surveillance by NST and, thereby, slightly increase the likelihood of fetal compromise (stillbirth).  I also discussed daily NST as an outpatient (at your office).  Patient discussed with her mother and made a firm decision against inpatient management. She prefers to come on Mon-Wed-Fri for Doppler studies and NST at [facility]. She understands the possible complications including stillbirth, which is slightly increased with outpatient management. She also understands that we would recommend admission if abnormal testing is detected.  I did not counsel on hypertension management today.  Recommendations: -NST tonight. Patient can either stay overnight or return to your office for NST tomorrow. -Doppler studies and NST at our office on Mon-Wed-Fri (No Doppler studies tomorrow). -Fetal growth assessment in 2 weeks. -Continue nifedipine and aspirin.  Thank you for your consult. Please do not hesitate to contact me if you have any questions or concerns.  Consultation including face-to-face counseling: 40 min.

## 2018-11-12 ENCOUNTER — Ambulatory Visit (HOSPITAL_COMMUNITY): Payer: BLUE CROSS/BLUE SHIELD

## 2018-11-12 ENCOUNTER — Other Ambulatory Visit (HOSPITAL_COMMUNITY): Payer: Self-pay | Admitting: *Deleted

## 2018-11-12 DIAGNOSIS — O283 Abnormal ultrasonic finding on antenatal screening of mother: Secondary | ICD-10-CM | POA: Diagnosis not present

## 2018-11-12 DIAGNOSIS — Z3A26 26 weeks gestation of pregnancy: Secondary | ICD-10-CM | POA: Diagnosis not present

## 2018-11-12 DIAGNOSIS — O365930 Maternal care for other known or suspected poor fetal growth, third trimester, not applicable or unspecified: Secondary | ICD-10-CM

## 2018-11-12 DIAGNOSIS — O365920 Maternal care for other known or suspected poor fetal growth, second trimester, not applicable or unspecified: Secondary | ICD-10-CM | POA: Diagnosis not present

## 2018-11-12 MED ORDER — VALACYCLOVIR HCL 1 G PO TABS: 500.0000 mg | ORAL_TABLET | Freq: Two times a day (BID) | ORAL | 1 refills | Status: DC

## 2018-11-12 NOTE — Progress Notes (Signed)
Pt monitored and strip reviewed by PAM LAWSON  And called to DR PRATT   Pt for d/c home teaching complete  At 1300

## 2018-11-12 NOTE — Discharge Summary (Signed)
Antenatal Physician Discharge Summary  Patient ID: Dawn Hoffman MRN: [id number] DOB/AGE: 11-28-1990 28 y.o.  Admit date: 11/10/2018 Discharge date: 11/12/2018  Admission Diagnoses: Active Problems:   HYPERTENSION, BENIGN SYSTEMIC   Abnormal fetal ultrasound      Severe IUGR with reverse EDF  Discharge Diagnoses: Same  Prenatal Procedures: ultrasound  Consults: Maternal Fetal Medicine  Hospital Course:  This is a 28 y.o. G2P0010 with IUP at [redacted]w[redacted]d admitted for persistent REDF on 3x/wk doppler monitoring She was observed, fetal heart rate monitoring remained reassuring. Repeat ultrasound revealed improved fetal weight to 450 gms, with normal fluid. Dopplers were not changed. MFM was consulted and discussed long term cae plan with patient. She was deemed stable for discharge to home with outpatient follow up. Including M,W, F doppler studies.  Discharge Exam: Temp:  [98 F (36.7 C)-98.6 F (37 C)] 98.6 F (37 C) (01/08 0833) Pulse Rate:  [75-91] 75 (01/08 0833) Resp:  [16-20] 17 (01/08 0833) BP: (123-133)/(80-99) 132/91 (01/08 0833) SpO2:  [98 %-100 %] 99 % (01/08 8828) Physical Examination: CONSTITUTIONAL: Well-developed, well-nourished female in no acute distress.  HENT:  Normocephalic, atraumatic, External right and left ear normal. Oropharynx is clear and moist  NECK: Normal range of motion, supple, no masses SKIN: Skin is warm and dry. No rash noted. NEUROLGIC: Alert and oriented to person, place, and time. PSYCHIATRIC: Normal mood and affect.  CARDIOVASCULAR: Normal heart rate noted, regular rhythm RESPIRATORY: Effort and breath sounds normal MUSCULOSKELETAL: Normal range of motion.  ABDOMEN: Soft, nontender, nondistended, gravid. Fetal monitoring: FHR: 150 bpm, Variability: moderate, Accelerations: Present, Decelerations: Absent  Uterine activity: no contractions per hour  Significant Diagnostic Studies:  Results for orders placed or performed during the hospital  encounter of 11/10/18 (from the past 168 hour(s))  CBC   Collection Time: 11/10/18 11:42 PM  Result Value Ref Range   WBC 11.4 (H) 4.0 - 10.5 K/uL   RBC 3.96 3.87 - 5.11 MIL/uL   Hemoglobin 10.7 (L) 12.0 - 15.0 g/dL   HCT 00.3 (L) 49.1 - 79.1 %   MCV 80.1 80.0 - 100.0 fL   MCH 27.0 26.0 - 34.0 pg   MCHC 33.8 30.0 - 36.0 g/dL   RDW 50.5 69.7 - 94.8 %   Platelets 243 150 - 400 K/uL   nRBC 0.0 0.0 - 0.2 %  Type and screen [facility] [place]   Collection Time: 11/10/18 11:42 PM  Result Value Ref Range   ABO/RH(D) B POS    Antibody Screen NEG    Sample Expiration      11/13/2018 Performed at [facility], [address] [place], [place] [address]   ABO/Rh   Collection Time: 11/10/18 11:42 PM  Result Value Ref Range   ABO/RH(D)      B POS Performed at [facility], [address], [place], [place] [address]    U/s shows breech, nml fluid, good FM, intermittent REDF  Future Appointments  Date Time Provider Department Center  11/13/2018  3:45 PM Brock Bad, MD [facility] None  11/14/2018  3:15 PM [facility] NST [facility]  11/14/2018  4:00 PM [facility] [place] 1 [facility]  11/17/2018  3:15 PM [facility] [place] 4 [facility]  11/17/2018  4:00 PM [facility] NST [facility]  11/19/2018  3:15 PM [facility] NST [facility]  11/19/2018  4:00 PM [facility] [place] 3 WH-MFCUS M[facility]-US  11/21/2018  3:15 PM [facility] NST [facility]  11/21/2018  4:00 PM [facility] [place] 1 WH-MFCUS MFC-US  Discharge Condition: Stable  Discharge disposition: 01-Home or Self Care       Discharge Instructions    Discharge activity:  No Restrictions   Complete by:  As directed    Discharge diet:  No restrictions   Complete by:  As directed    Fetal Kick Count:  Lie on our left side for one hour after a meal, and count the number of times your baby kicks.  If it is less than 5 times, get up, move around and drink some juice.  Repeat the test 30 minutes later.  If it is still less than 5 kicks in an hour,  notify your doctor.   Complete by:  As directed    Notify physician for a general feeling that "something is not right"   Complete by:  As directed    Notify physician for increase or change in vaginal discharge   Complete by:  As directed    Notify physician for intestinal cramps, with or without diarrhea, sometimes described as "gas pain"   Complete by:  As directed    Notify physician for leaking of fluid   Complete by:  As directed    Notify physician for low, dull backache, unrelieved by heat or Tylenol   Complete by:  As directed    Notify physician for menstrual like cramps   Complete by:  As directed    Notify physician for pelvic pressure   Complete by:  As directed    Notify physician for uterine contractions.  These may be painless and feel like the uterus is tightening or the baby is  "balling up"   Complete by:  As directed    Notify physician for vaginal bleeding   Complete by:  As directed    PRETERM LABOR:  Includes any of the follwing symptoms that occur between 20 - [redacted] weeks gestation.  If these symptoms are not stopped, preterm labor can result in preterm delivery, placing your baby at risk   Complete by:  As directed      Allergies as of 11/12/2018   No Known Allergies     Medication List    TAKE these medications   aspirin EC 81 MG tablet Take 81 mg by mouth daily.   COMFORT FIT MATERNITY SUPP SM Misc Wear as directed.   fexofenadine 180 MG tablet Commonly known as:  ALLEGRA Take 180 mg by mouth daily.   NIFEdipine 60 MG 24 hr tablet Commonly known as:  PROCARDIA XL Take 1 tablet (60 mg total) by mouth 2 (two) times daily.   prenatal vitamin w/FE, FA 27-1 MG Tabs tablet Take 1 tablet by mouth daily before breakfast.   valACYclovir 1000 MG tablet Commonly known as:  VALTREX Take 0.5 tablets (500 mg total) by mouth 2 (two) times daily.      Follow-up Information    [facility] Follow up.   Specialty:  Obstetrics and  Gynecology Why:  keep next scheduled appointment Contact information: [address], [address] [place] [address] [phone]       [facility] Follow up in 1 day(s).   Specialty:  Maternal and Fetal Medicine Why:  keep next scheduled appointment Contact information: [address] [id number] mc [place] [address] [phone]          Signed: Reva Bores M.D. 11/12/2018, 9:44 AM

## 2018-11-13 ENCOUNTER — Ambulatory Visit (HOSPITAL_COMMUNITY): Payer: BLUE CROSS/BLUE SHIELD

## 2018-11-13 ENCOUNTER — Encounter: Payer: Medicaid Other | Admitting: Obstetrics

## 2018-11-14 ENCOUNTER — Ambulatory Visit (HOSPITAL_COMMUNITY)
Admit: 2018-11-14 | Discharge: 2018-11-14 | Disposition: A | Discharge status: Home / Self Care | Payer: Medicaid Other | Attending: Obstetrics | Admitting: Obstetrics

## 2018-11-14 ENCOUNTER — Ambulatory Visit (HOSPITAL_BASED_OUTPATIENT_CLINIC_OR_DEPARTMENT_OTHER)
Admit: 2018-11-14 | Discharge: 2018-11-14 | Disposition: A | Discharge status: Home / Self Care | Payer: Medicaid Other | Attending: Obstetrics | Admitting: Obstetrics

## 2018-11-14 ENCOUNTER — Encounter (HOSPITAL_COMMUNITY): Payer: Self-pay

## 2018-11-14 ENCOUNTER — Other Ambulatory Visit (HOSPITAL_COMMUNITY): Payer: Self-pay | Admitting: Obstetrics and Gynecology

## 2018-11-14 DIAGNOSIS — O283 Abnormal ultrasonic finding on antenatal screening of mother: Secondary | ICD-10-CM | POA: Diagnosis not present

## 2018-11-14 DIAGNOSIS — O365930 Maternal care for other known or suspected poor fetal growth, third trimester, not applicable or unspecified: Secondary | ICD-10-CM

## 2018-11-14 DIAGNOSIS — O289 Unspecified abnormal findings on antenatal screening of mother: Secondary | ICD-10-CM | POA: Diagnosis not present

## 2018-11-14 DIAGNOSIS — I10 Essential (primary) hypertension: Secondary | ICD-10-CM

## 2018-11-14 DIAGNOSIS — Z3A26 26 weeks gestation of pregnancy: Secondary | ICD-10-CM

## 2018-11-14 DIAGNOSIS — O10013 Pre-existing essential hypertension complicating pregnancy, third trimester: Secondary | ICD-10-CM

## 2018-11-14 DIAGNOSIS — Z3402 Encounter for supervision of normal first pregnancy, second trimester: Secondary | ICD-10-CM

## 2018-11-14 DIAGNOSIS — O99212 Obesity complicating pregnancy, second trimester: Secondary | ICD-10-CM

## 2018-11-14 NOTE — Procedures (Signed)
Dawn Hoffman [birth year] [redacted]w[redacted]d  Fetus A Non-Stress Test Interpretation for 11/14/18  Indication: IUGR  Fetal Heart Rate A Mode: External Baseline Rate (A): 150 bpm Variability: Moderate Accelerations: 10 x 10 Decelerations: None Multiple birth?: No  Uterine Activity Mode: Palpation, Toco Contraction Frequency (min): none Resting Tone Palpated: Relaxed Resting Time: Adequate  Interpretation (Fetal Testing) Nonstress Test Interpretation: Reactive Comments: Reviewed FHR tracing with Dr. Judeth Cornfield, reactive for GA

## 2018-11-17 ENCOUNTER — Ambulatory Visit (HOSPITAL_BASED_OUTPATIENT_CLINIC_OR_DEPARTMENT_OTHER)
Admit: 2018-11-17 | Discharge: 2018-11-17 | Disposition: A | Discharge status: Home / Self Care | Payer: Medicaid Other | Attending: Obstetrics | Admitting: Obstetrics

## 2018-11-17 ENCOUNTER — Ambulatory Visit (HOSPITAL_COMMUNITY)
Admit: 2018-11-17 | Discharge: 2018-11-17 | Disposition: A | Discharge status: Home / Self Care | Payer: Medicaid Other | Attending: Obstetrics | Admitting: Obstetrics

## 2018-11-17 ENCOUNTER — Encounter (HOSPITAL_COMMUNITY): Payer: Self-pay

## 2018-11-17 DIAGNOSIS — O365930 Maternal care for other known or suspected poor fetal growth, third trimester, not applicable or unspecified: Secondary | ICD-10-CM | POA: Diagnosis not present

## 2018-11-17 DIAGNOSIS — Z3402 Encounter for supervision of normal first pregnancy, second trimester: Secondary | ICD-10-CM

## 2018-11-17 DIAGNOSIS — O283 Abnormal ultrasonic finding on antenatal screening of mother: Secondary | ICD-10-CM | POA: Diagnosis not present

## 2018-11-17 DIAGNOSIS — O10013 Pre-existing essential hypertension complicating pregnancy, third trimester: Secondary | ICD-10-CM | POA: Diagnosis not present

## 2018-11-17 DIAGNOSIS — O99212 Obesity complicating pregnancy, second trimester: Secondary | ICD-10-CM

## 2018-11-17 DIAGNOSIS — Z3A26 26 weeks gestation of pregnancy: Secondary | ICD-10-CM | POA: Diagnosis not present

## 2018-11-17 NOTE — Procedures (Signed)
Dawn Hoffman 03-30-1991 [id number]  Fetus A Non-Stress Test Interpretation for 11/17/18  Indication: IUGR  Fetal Heart Rate A Mode: External Baseline Rate (A): 145 bpm Variability: Moderate Accelerations: 15 x 15(X 1) Decelerations: None Multiple birth?: No  Uterine Activity Mode: Palpation, Toco Contraction Frequency (min): Nonw Resting Tone Palpated: Relaxed Resting Time: Adequate  Interpretation (Fetal Testing) Overall Impression: Reassuring for gestational age Comments: EFM tracing reviewed by Dr. Judeth CornfieldShankar

## 2018-11-19 ENCOUNTER — Ambulatory Visit (HOSPITAL_BASED_OUTPATIENT_CLINIC_OR_DEPARTMENT_OTHER)
Admit: 2018-11-19 | Discharge: 2018-11-19 | Disposition: A | Discharge status: Home / Self Care | Payer: Medicaid Other | Attending: Obstetrics | Admitting: Obstetrics

## 2018-11-19 ENCOUNTER — Ambulatory Visit (HOSPITAL_COMMUNITY)
Admit: 2018-11-19 | Discharge: 2018-11-19 | Disposition: A | Discharge status: Home / Self Care | Payer: Medicaid Other | Attending: Obstetrics | Admitting: Obstetrics

## 2018-11-19 ENCOUNTER — Encounter (HOSPITAL_COMMUNITY): Payer: Self-pay

## 2018-11-19 DIAGNOSIS — O99212 Obesity complicating pregnancy, second trimester: Secondary | ICD-10-CM | POA: Insufficient documentation

## 2018-11-19 DIAGNOSIS — O10012 Pre-existing essential hypertension complicating pregnancy, second trimester: Secondary | ICD-10-CM

## 2018-11-19 DIAGNOSIS — O289 Unspecified abnormal findings on antenatal screening of mother: Secondary | ICD-10-CM

## 2018-11-19 DIAGNOSIS — O365920 Maternal care for other known or suspected poor fetal growth, second trimester, not applicable or unspecified: Secondary | ICD-10-CM | POA: Diagnosis not present

## 2018-11-19 DIAGNOSIS — Z3A27 27 weeks gestation of pregnancy: Secondary | ICD-10-CM

## 2018-11-19 DIAGNOSIS — O365930 Maternal care for other known or suspected poor fetal growth, third trimester, not applicable or unspecified: Secondary | ICD-10-CM | POA: Diagnosis not present

## 2018-11-19 DIAGNOSIS — Z3402 Encounter for supervision of normal first pregnancy, second trimester: Secondary | ICD-10-CM

## 2018-11-19 DIAGNOSIS — O283 Abnormal ultrasonic finding on antenatal screening of mother: Secondary | ICD-10-CM

## 2018-11-19 NOTE — Procedures (Addendum)
Dawn Hoffman May 04, 1991 [redacted]w[redacted]d  Fetus A Non-Stress Test Interpretation for 11/19/18  Indication: IUGR  Fetal Heart Rate A Mode: External Baseline Rate (A): 140 bpm Variability: Moderate Accelerations: 10 x 10, 15 x 15 Decelerations: None Multiple birth?: No  Uterine Activity Mode: Palpation, Toco Contraction Frequency (min): None Resting Tone Palpated: Relaxed Resting Time: Adequate  Interpretation (Fetal Testing) Nonstress Test Interpretation: Reactive Overall Impression: Reassuring for gestational age Comments: EFM tracing reviewed by Dr. Judeth Cornfield   Tracing reviewed. Baseline 145 bpm and accelerations were seen (10x10)

## 2018-11-20 ENCOUNTER — Other Ambulatory Visit (HOSPITAL_COMMUNITY): Payer: Self-pay | Admitting: *Deleted

## 2018-11-20 ENCOUNTER — Ambulatory Visit (HOSPITAL_COMMUNITY): Payer: BLUE CROSS/BLUE SHIELD

## 2018-11-20 DIAGNOSIS — O365990 Maternal care for other known or suspected poor fetal growth, unspecified trimester, not applicable or unspecified: Secondary | ICD-10-CM

## 2018-11-20 DIAGNOSIS — O365930 Maternal care for other known or suspected poor fetal growth, third trimester, not applicable or unspecified: Secondary | ICD-10-CM

## 2018-11-21 ENCOUNTER — Other Ambulatory Visit (HOSPITAL_COMMUNITY): Payer: Self-pay | Admitting: Obstetrics and Gynecology

## 2018-11-21 ENCOUNTER — Ambulatory Visit (HOSPITAL_BASED_OUTPATIENT_CLINIC_OR_DEPARTMENT_OTHER)
Admit: 2018-11-21 | Discharge: 2018-11-21 | Disposition: A | Discharge status: Home / Self Care | Payer: Medicaid Other | Attending: Obstetrics | Admitting: Obstetrics

## 2018-11-21 ENCOUNTER — Encounter (HOSPITAL_COMMUNITY): Payer: Self-pay

## 2018-11-21 ENCOUNTER — Ambulatory Visit (HOSPITAL_COMMUNITY)
Admit: 2018-11-21 | Discharge: 2018-11-21 | Disposition: A | Discharge status: Home / Self Care | Payer: Medicaid Other | Attending: Obstetrics | Admitting: Obstetrics

## 2018-11-21 DIAGNOSIS — O289 Unspecified abnormal findings on antenatal screening of mother: Secondary | ICD-10-CM

## 2018-11-21 DIAGNOSIS — O99212 Obesity complicating pregnancy, second trimester: Secondary | ICD-10-CM | POA: Diagnosis not present

## 2018-11-21 DIAGNOSIS — O365930 Maternal care for other known or suspected poor fetal growth, third trimester, not applicable or unspecified: Secondary | ICD-10-CM

## 2018-11-21 DIAGNOSIS — O10012 Pre-existing essential hypertension complicating pregnancy, second trimester: Secondary | ICD-10-CM | POA: Insufficient documentation

## 2018-11-21 DIAGNOSIS — I10 Essential (primary) hypertension: Secondary | ICD-10-CM

## 2018-11-21 DIAGNOSIS — Z3A27 27 weeks gestation of pregnancy: Secondary | ICD-10-CM

## 2018-11-21 DIAGNOSIS — O365920 Maternal care for other known or suspected poor fetal growth, second trimester, not applicable or unspecified: Secondary | ICD-10-CM | POA: Diagnosis present

## 2018-11-21 DIAGNOSIS — Z3402 Encounter for supervision of normal first pregnancy, second trimester: Secondary | ICD-10-CM

## 2018-11-21 NOTE — Procedures (Signed)
Dawn Hoffman 09/13/1991 [redacted]w[redacted]d  Fetus A Non-Stress Test Interpretation for 11/21/18  Indication: IUGR  Fetal Heart Rate A Mode: External Baseline Rate (A): 145 bpm Variability: Moderate Accelerations: 15 x 15, 10 x 10 Decelerations: Variable Multiple birth?: No  Uterine Activity Mode: Palpation, Toco Contraction Frequency (min): none Resting Tone Palpated: Relaxed Resting Time: Adequate  Interpretation (Fetal Testing) Nonstress Test Interpretation: Reactive Comments: EFM tracing reviewed by Dr. Judeth Cornfield

## 2018-11-24 ENCOUNTER — Ambulatory Visit (HOSPITAL_COMMUNITY)
Admission: RE | Admit: 2018-11-24 | Discharge: 2018-11-24 | Disposition: A | Discharge status: Home / Self Care | Payer: Medicaid Other | Source: Ambulatory Visit | Attending: Obstetrics | Admitting: Obstetrics

## 2018-11-24 ENCOUNTER — Encounter (HOSPITAL_COMMUNITY): Payer: Self-pay

## 2018-11-24 ENCOUNTER — Other Ambulatory Visit (HOSPITAL_COMMUNITY): Payer: Self-pay | Admitting: Obstetrics and Gynecology

## 2018-11-24 DIAGNOSIS — O365930 Maternal care for other known or suspected poor fetal growth, third trimester, not applicable or unspecified: Secondary | ICD-10-CM | POA: Insufficient documentation

## 2018-11-24 DIAGNOSIS — I10 Essential (primary) hypertension: Secondary | ICD-10-CM | POA: Insufficient documentation

## 2018-11-24 DIAGNOSIS — Z3402 Encounter for supervision of normal first pregnancy, second trimester: Secondary | ICD-10-CM | POA: Insufficient documentation

## 2018-11-24 DIAGNOSIS — O10012 Pre-existing essential hypertension complicating pregnancy, second trimester: Secondary | ICD-10-CM | POA: Diagnosis not present

## 2018-11-24 DIAGNOSIS — O99212 Obesity complicating pregnancy, second trimester: Secondary | ICD-10-CM

## 2018-11-24 DIAGNOSIS — Z3A27 27 weeks gestation of pregnancy: Secondary | ICD-10-CM

## 2018-11-24 DIAGNOSIS — O289 Unspecified abnormal findings on antenatal screening of mother: Secondary | ICD-10-CM

## 2018-11-24 DIAGNOSIS — O283 Abnormal ultrasonic finding on antenatal screening of mother: Secondary | ICD-10-CM | POA: Diagnosis present

## 2018-11-24 NOTE — Procedures (Signed)
Dawn Hoffman 09/18/1991 [redacted]w[redacted]d  Fetus A Non-Stress Test Interpretation for 11/24/18  Indication: IUGR  Fetal Heart Rate A Mode: External Baseline Rate (A): 145 bpm Variability: Moderate Accelerations: 10 x 10 Decelerations: None Multiple birth?: No  Uterine Activity Mode: Palpation, Toco Contraction Frequency (min): None Resting Tone Palpated: Relaxed Resting Time: Adequate  Interpretation (Fetal Testing) Overall Impression: Reassuring for gestational age Comments: EFM tracing reviewed by Dr. Judeth Cornfield

## 2018-11-25 ENCOUNTER — Other Ambulatory Visit (HOSPITAL_COMMUNITY): Payer: Self-pay | Admitting: *Deleted

## 2018-11-25 DIAGNOSIS — O365930 Maternal care for other known or suspected poor fetal growth, third trimester, not applicable or unspecified: Secondary | ICD-10-CM

## 2018-11-26 ENCOUNTER — Ambulatory Visit (HOSPITAL_COMMUNITY)
Admission: RE | Admit: 2018-11-26 | Discharge: 2018-11-26 | Disposition: A | Discharge status: Home / Self Care | Payer: Medicaid Other | Source: Ambulatory Visit | Attending: Obstetrics | Admitting: Obstetrics

## 2018-11-26 DIAGNOSIS — O99212 Obesity complicating pregnancy, second trimester: Secondary | ICD-10-CM | POA: Diagnosis not present

## 2018-11-26 DIAGNOSIS — I10 Essential (primary) hypertension: Secondary | ICD-10-CM | POA: Diagnosis present

## 2018-11-26 DIAGNOSIS — Z3402 Encounter for supervision of normal first pregnancy, second trimester: Secondary | ICD-10-CM | POA: Insufficient documentation

## 2018-11-26 DIAGNOSIS — O365930 Maternal care for other known or suspected poor fetal growth, third trimester, not applicable or unspecified: Secondary | ICD-10-CM | POA: Diagnosis present

## 2018-11-26 DIAGNOSIS — O10013 Pre-existing essential hypertension complicating pregnancy, third trimester: Secondary | ICD-10-CM | POA: Diagnosis not present

## 2018-11-26 DIAGNOSIS — O289 Unspecified abnormal findings on antenatal screening of mother: Secondary | ICD-10-CM | POA: Diagnosis not present

## 2018-11-26 DIAGNOSIS — O283 Abnormal ultrasonic finding on antenatal screening of mother: Secondary | ICD-10-CM | POA: Diagnosis present

## 2018-11-26 DIAGNOSIS — Z3A28 28 weeks gestation of pregnancy: Secondary | ICD-10-CM

## 2018-11-26 NOTE — Procedures (Signed)
Dawn Hoffman 01/07/1991 [redacted]w[redacted]d  Fetus A Non-Stress Test Interpretation for 11/26/18  Indication: IUGR  Fetal Heart Rate A Mode: External Baseline Rate (A): 145 bpm Variability: Moderate Accelerations: 10 x 10 Decelerations: Variable Multiple birth?: No  Uterine Activity Mode: Palpation, Toco Contraction Frequency (min): None Resting Tone Palpated: Relaxed Resting Time: Adequate  Interpretation (Fetal Testing) Overall Impression: Reassuring for gestational age Comments: EFM tracing reviewed by Dr. Judeth Cornfield

## 2018-11-28 ENCOUNTER — Ambulatory Visit (HOSPITAL_COMMUNITY)
Admission: RE | Admit: 2018-11-28 | Discharge: 2018-11-28 | Disposition: A | Discharge status: Home / Self Care | Payer: Medicaid Other | Source: Ambulatory Visit | Attending: Obstetrics | Admitting: Obstetrics

## 2018-11-28 ENCOUNTER — Encounter (HOSPITAL_COMMUNITY): Payer: Self-pay

## 2018-11-28 DIAGNOSIS — O289 Unspecified abnormal findings on antenatal screening of mother: Secondary | ICD-10-CM | POA: Diagnosis not present

## 2018-11-28 DIAGNOSIS — Z3A28 28 weeks gestation of pregnancy: Secondary | ICD-10-CM

## 2018-11-28 DIAGNOSIS — Z3402 Encounter for supervision of normal first pregnancy, second trimester: Secondary | ICD-10-CM | POA: Insufficient documentation

## 2018-11-28 DIAGNOSIS — O99212 Obesity complicating pregnancy, second trimester: Secondary | ICD-10-CM | POA: Diagnosis not present

## 2018-11-28 DIAGNOSIS — I10 Essential (primary) hypertension: Secondary | ICD-10-CM

## 2018-11-28 DIAGNOSIS — O10013 Pre-existing essential hypertension complicating pregnancy, third trimester: Secondary | ICD-10-CM | POA: Diagnosis not present

## 2018-11-28 DIAGNOSIS — O365930 Maternal care for other known or suspected poor fetal growth, third trimester, not applicable or unspecified: Secondary | ICD-10-CM

## 2018-11-28 NOTE — Procedures (Signed)
Dawn Hoffman Apr 22, 1991 [redacted]w[redacted]d  Fetus A Non-Stress Test Interpretation for 11/28/18  Indication: Chronic Hypertenstion  Fetal Heart Rate A Mode: External Baseline Rate (A): 140 bpm Variability: Minimal, Moderate Accelerations: 10 x 10 Decelerations: None Multiple birth?: No  Uterine Activity Mode: Palpation, Toco Contraction Frequency (min): none Resting Tone Palpated: Relaxed Resting Time: Adequate  Interpretation (Fetal Testing) Nonstress Test Interpretation: Reactive Overall Impression: Reassuring for gestational age Comments: EFM tracing reviewed by Dr. Judeth Cornfield

## 2018-12-01 ENCOUNTER — Ambulatory Visit (HOSPITAL_COMMUNITY)
Admission: RE | Admit: 2018-12-01 | Discharge: 2018-12-01 | Disposition: A | Discharge status: Home / Self Care | Payer: Medicaid Other | Source: Ambulatory Visit | Attending: Obstetrics | Admitting: Obstetrics

## 2018-12-01 ENCOUNTER — Encounter (HOSPITAL_COMMUNITY): Payer: Self-pay

## 2018-12-01 VITALS — Wt 194.0 lb

## 2018-12-01 DIAGNOSIS — O10012 Pre-existing essential hypertension complicating pregnancy, second trimester: Secondary | ICD-10-CM | POA: Diagnosis not present

## 2018-12-01 DIAGNOSIS — O99212 Obesity complicating pregnancy, second trimester: Secondary | ICD-10-CM

## 2018-12-01 DIAGNOSIS — I10 Essential (primary) hypertension: Secondary | ICD-10-CM | POA: Insufficient documentation

## 2018-12-01 DIAGNOSIS — Z3402 Encounter for supervision of normal first pregnancy, second trimester: Secondary | ICD-10-CM | POA: Insufficient documentation

## 2018-12-01 DIAGNOSIS — O365990 Maternal care for other known or suspected poor fetal growth, unspecified trimester, not applicable or unspecified: Secondary | ICD-10-CM | POA: Diagnosis present

## 2018-12-01 DIAGNOSIS — Z3A28 28 weeks gestation of pregnancy: Secondary | ICD-10-CM

## 2018-12-01 DIAGNOSIS — O289 Unspecified abnormal findings on antenatal screening of mother: Secondary | ICD-10-CM | POA: Diagnosis not present

## 2018-12-01 DIAGNOSIS — O365930 Maternal care for other known or suspected poor fetal growth, third trimester, not applicable or unspecified: Secondary | ICD-10-CM | POA: Diagnosis not present

## 2018-12-01 NOTE — Procedures (Signed)
Dawn Hoffman Jun 20, 1991 [redacted]w[redacted]d  Fetus A Non-Stress Test Interpretation for 12/01/18  Indication: IUGR  Fetal Heart Rate A Mode: External Baseline Rate (A): 145 bpm Variability: Moderate Accelerations: 10 x 10 Decelerations: None Multiple birth?: No  Uterine Activity Mode: Palpation, Toco Contraction Frequency (min): None Resting Tone Palpated: Relaxed Resting Time: Adequate  Interpretation (Fetal Testing) Overall Impression: Reassuring for gestational age Comments: EFM tracing reviewed by Dr. Judeth Cornfield

## 2018-12-02 ENCOUNTER — Encounter: Payer: Self-pay | Admitting: Obstetrics

## 2018-12-02 ENCOUNTER — Ambulatory Visit (INDEPENDENT_AMBULATORY_CARE_PROVIDER_SITE_OTHER): Payer: Medicaid Other | Admitting: Obstetrics

## 2018-12-02 DIAGNOSIS — O10913 Unspecified pre-existing hypertension complicating pregnancy, third trimester: Secondary | ICD-10-CM

## 2018-12-02 DIAGNOSIS — J301 Allergic rhinitis due to pollen: Secondary | ICD-10-CM

## 2018-12-02 DIAGNOSIS — O10919 Unspecified pre-existing hypertension complicating pregnancy, unspecified trimester: Secondary | ICD-10-CM

## 2018-12-02 DIAGNOSIS — O0990 Supervision of high risk pregnancy, unspecified, unspecified trimester: Secondary | ICD-10-CM

## 2018-12-02 DIAGNOSIS — O0993 Supervision of high risk pregnancy, unspecified, third trimester: Secondary | ICD-10-CM

## 2018-12-02 MED ORDER — FEXOFENADINE HCL 180 MG PO TABS: 180.0000 mg | ORAL_TABLET | Freq: Every day | ORAL | 11 refills | Status: DC

## 2018-12-02 MED ORDER — TETANUS-DIPHTH-ACELL PERTUSSIS 5-2.5-18.5 LF-MCG/0.5 IM SUSP: 0.5000 mL | Freq: Once | INTRAMUSCULAR | Status: DC

## 2018-12-02 MED ORDER — LABETALOL HCL 100 MG PO TABS: 100.0000 mg | ORAL_TABLET | Freq: Two times a day (BID) | ORAL | 1 refills | Status: DC

## 2018-12-03 ENCOUNTER — Ambulatory Visit (HOSPITAL_COMMUNITY): Payer: Medicaid Other

## 2018-12-03 ENCOUNTER — Encounter: Payer: Self-pay | Admitting: Obstetrics

## 2018-12-03 ENCOUNTER — Ambulatory Visit (HOSPITAL_COMMUNITY): Admission: RE | Admit: 2018-12-03 | Payer: Medicaid Other | Source: Ambulatory Visit

## 2018-12-03 NOTE — Progress Notes (Signed)
Subjective:  Dawn Hoffman is a 28 y.o. G2P0010 at [redacted]w[redacted]d being seen today for ongoing prenatal care.  She is currently monitored for the following issues for this high-risk pregnancy and has MIGRAINE, UNSPEC., W/O INTRACTABLE MIGRAINE; HYPERTENSION, BENIGN SYSTEMIC; RHINITIS, ALLERGIC; ASTHMA, UNSPECIFIED; Encounter for supervision of normal first pregnancy in second trimester; and Abnormal fetal ultrasound on their problem list.  Patient reports no complaints.   .  .   . Denies leaking of fluid.   The following portions of the patient's history were reviewed and updated as appropriate: allergies, current medications, past family history, past medical history, past social history, past surgical history and problem list. Problem list updated.  Objective:  There were no vitals filed for this visit.  Fetal Status:           General:  Alert, oriented and cooperative. Patient is in no acute distress.  Skin: Skin is warm and dry. No rash noted.   Cardiovascular: Normal heart rate noted  Respiratory: Normal respiratory effort, no problems with respiration noted  Abdomen: Soft, gravid, appropriate for gestational age.       Pelvic:  Cervical exam deferred        Extremities: Normal range of motion.     Mental Status: Normal mood and affect. Normal behavior. Normal judgment and thought content.   Urinalysis:      Assessment and Plan:  Pregnancy: G2P0010 at [redacted]w[redacted]d  1. Supervision of high risk pregnancy, antepartum Rx: - Tdap (BOOSTRIX) injection 0.5 mL  2. Chronic hypertension affecting pregnancy Rx: - labetalol (NORMODYNE) 100 MG tablet; Take 1 tablet (100 mg total) by mouth 2 (two) times daily.  Dispense: 60 tablet; Refill: 1 - taking Procardia 60 XL bid  3. Allergic rhinitis due to pollen, unspecified seasonality Rx: - fexofenadine (ALLEGRA) 180 MG tablet; Take 1 tablet (180 mg total) by mouth daily.  Dispense: 30 tablet; Refill: 11  Preterm labor symptoms and general obstetric  precautions including but not limited to vaginal bleeding, contractions, leaking of fluid and fetal movement were reviewed in detail with the patient. Please refer to After Visit Summary for other counseling recommendations.  Return in about 1 week (around 12/09/2018) for ROB.   Brock Bad, MD

## 2018-12-04 ENCOUNTER — Ambulatory Visit (HOSPITAL_COMMUNITY)
Admission: RE | Admit: 2018-12-04 | Discharge: 2018-12-04 | Disposition: A | Discharge status: Home / Self Care | Payer: Medicaid Other | Source: Ambulatory Visit | Attending: Obstetrics and Gynecology | Admitting: Obstetrics and Gynecology

## 2018-12-04 ENCOUNTER — Ambulatory Visit (HOSPITAL_COMMUNITY)
Admission: RE | Admit: 2018-12-04 | Discharge: 2018-12-04 | Disposition: A | Discharge status: Home / Self Care | Payer: Medicaid Other | Source: Ambulatory Visit | Attending: Obstetrics | Admitting: Obstetrics

## 2018-12-04 ENCOUNTER — Encounter (HOSPITAL_COMMUNITY): Payer: Self-pay

## 2018-12-04 VITALS — BP 147/93 | HR 73 | Wt 193.2 lb

## 2018-12-04 DIAGNOSIS — O365990 Maternal care for other known or suspected poor fetal growth, unspecified trimester, not applicable or unspecified: Secondary | ICD-10-CM | POA: Diagnosis not present

## 2018-12-04 DIAGNOSIS — O10013 Pre-existing essential hypertension complicating pregnancy, third trimester: Secondary | ICD-10-CM

## 2018-12-04 DIAGNOSIS — O365930 Maternal care for other known or suspected poor fetal growth, third trimester, not applicable or unspecified: Secondary | ICD-10-CM | POA: Diagnosis not present

## 2018-12-04 DIAGNOSIS — I10 Essential (primary) hypertension: Secondary | ICD-10-CM | POA: Insufficient documentation

## 2018-12-04 DIAGNOSIS — Z3A29 29 weeks gestation of pregnancy: Secondary | ICD-10-CM

## 2018-12-04 DIAGNOSIS — O99213 Obesity complicating pregnancy, third trimester: Secondary | ICD-10-CM

## 2018-12-04 DIAGNOSIS — Z3402 Encounter for supervision of normal first pregnancy, second trimester: Secondary | ICD-10-CM

## 2018-12-04 NOTE — Procedures (Signed)
Dawn Hoffman February 20, 1991 2712w2d  Fetus A Non-Stress Test Interpretation for 12/04/18  Indication: Chronic Hypertenstion  Fetal Heart Rate A Mode: External Baseline Rate (A): 145 bpm Variability: Moderate Accelerations: 10 x 10, 15 x 15 Decelerations: None Multiple birth?: No  Uterine Activity Mode: Palpation, Toco Contraction Frequency (min): none  Interpretation (Fetal Testing) Nonstress Test Interpretation: Reactive Overall Impression: Reassuring for gestational age Comments: EFM tracing reviewed by Dr. Judeth CornfieldShankar

## 2018-12-05 ENCOUNTER — Ambulatory Visit (HOSPITAL_COMMUNITY): Payer: Medicaid Other

## 2018-12-05 ENCOUNTER — Ambulatory Visit (HOSPITAL_COMMUNITY): Admission: RE | Admit: 2018-12-05 | Payer: Medicaid Other | Source: Ambulatory Visit

## 2018-12-08 ENCOUNTER — Inpatient Hospital Stay (HOSPITAL_COMMUNITY): Payer: Medicaid Other | Admitting: Anesthesiology

## 2018-12-08 ENCOUNTER — Ambulatory Visit (HOSPITAL_BASED_OUTPATIENT_CLINIC_OR_DEPARTMENT_OTHER)
Admission: RE | Admit: 2018-12-08 | Discharge: 2018-12-08 | Disposition: A | Discharge status: Home / Self Care | Payer: Medicaid Other | Source: Ambulatory Visit | Attending: Obstetrics | Admitting: Obstetrics

## 2018-12-08 ENCOUNTER — Encounter (HOSPITAL_COMMUNITY)
Admission: AD | Disposition: A | Discharge status: Home / Self Care | Payer: Self-pay | Source: Home / Self Care | Attending: Obstetrics and Gynecology

## 2018-12-08 ENCOUNTER — Other Ambulatory Visit: Payer: Self-pay

## 2018-12-08 ENCOUNTER — Ambulatory Visit (HOSPITAL_COMMUNITY)
Admission: RE | Admit: 2018-12-08 | Discharge: 2018-12-08 | Disposition: A | Discharge status: Home / Self Care | Payer: Medicaid Other | Source: Ambulatory Visit | Attending: Obstetrics and Gynecology | Admitting: Obstetrics and Gynecology

## 2018-12-08 ENCOUNTER — Telehealth: Payer: Self-pay

## 2018-12-08 ENCOUNTER — Encounter (HOSPITAL_COMMUNITY): Payer: Self-pay | Admitting: *Deleted

## 2018-12-08 ENCOUNTER — Inpatient Hospital Stay (HOSPITAL_COMMUNITY)
Admission: AD | Admit: 2018-12-08 | Discharge: 2018-12-12 | DRG: 787 | Disposition: A | Discharge status: Home / Self Care | Payer: Medicaid Other | Attending: Obstetrics and Gynecology | Admitting: Obstetrics and Gynecology

## 2018-12-08 ENCOUNTER — Other Ambulatory Visit (HOSPITAL_COMMUNITY): Payer: Self-pay | Admitting: Obstetrics and Gynecology

## 2018-12-08 DIAGNOSIS — Z3A29 29 weeks gestation of pregnancy: Secondary | ICD-10-CM | POA: Diagnosis not present

## 2018-12-08 DIAGNOSIS — O365990 Maternal care for other known or suspected poor fetal growth, unspecified trimester, not applicable or unspecified: Secondary | ICD-10-CM | POA: Insufficient documentation

## 2018-12-08 DIAGNOSIS — O365930 Maternal care for other known or suspected poor fetal growth, third trimester, not applicable or unspecified: Secondary | ICD-10-CM

## 2018-12-08 DIAGNOSIS — O10013 Pre-existing essential hypertension complicating pregnancy, third trimester: Secondary | ICD-10-CM | POA: Diagnosis not present

## 2018-12-08 DIAGNOSIS — I10 Essential (primary) hypertension: Secondary | ICD-10-CM | POA: Insufficient documentation

## 2018-12-08 DIAGNOSIS — O1002 Pre-existing essential hypertension complicating childbirth: Secondary | ICD-10-CM | POA: Diagnosis present

## 2018-12-08 DIAGNOSIS — J301 Allergic rhinitis due to pollen: Secondary | ICD-10-CM

## 2018-12-08 DIAGNOSIS — O358XX0 Maternal care for other (suspected) fetal abnormality and damage, not applicable or unspecified: Principal | ICD-10-CM | POA: Diagnosis present

## 2018-12-08 DIAGNOSIS — O289 Unspecified abnormal findings on antenatal screening of mother: Secondary | ICD-10-CM

## 2018-12-08 DIAGNOSIS — Z3402 Encounter for supervision of normal first pregnancy, second trimester: Secondary | ICD-10-CM

## 2018-12-08 DIAGNOSIS — O99213 Obesity complicating pregnancy, third trimester: Secondary | ICD-10-CM

## 2018-12-08 LAB — CBC
HCT: 36.1 % (ref 36.0–46.0)
Hemoglobin: 12.2 g/dL (ref 12.0–15.0)
MCH: 27.1 pg (ref 26.0–34.0)
MCHC: 33.8 g/dL (ref 30.0–36.0)
MCV: 80 fL (ref 80.0–100.0)
Platelets: 248 10*3/uL (ref 150–400)
RBC: 4.51 MIL/uL (ref 3.87–5.11)
RDW: 13.4 % (ref 11.5–15.5)
WBC: 12.9 10*3/uL — ABNORMAL HIGH (ref 4.0–10.5)
nRBC: 0 % (ref 0.0–0.2)

## 2018-12-08 LAB — TYPE AND SCREEN
ABO/RH(D): B POS
Antibody Screen: NEGATIVE

## 2018-12-08 SURGERY — Surgical Case
Anesthesia: Spinal | Site: Abdomen | Wound class: Clean Contaminated

## 2018-12-08 MED ORDER — SODIUM CHLORIDE 0.9 % IR SOLN
Status: DC | PRN
  Administered 2018-12-08: 1000 mL

## 2018-12-08 MED ORDER — DEXAMETHASONE SODIUM PHOSPHATE 4 MG/ML IJ SOLN
INTRAMUSCULAR | Status: DC | PRN
  Administered 2018-12-08: 4 mg via INTRAVENOUS

## 2018-12-08 MED ORDER — SENNOSIDES-DOCUSATE SODIUM 8.6-50 MG PO TABS: 2.0000 | ORAL_TABLET | ORAL | Status: DC

## 2018-12-08 MED ORDER — NALOXONE HCL 4 MG/10ML IJ SOLN: 1.0000 ug/kg/h | INTRAVENOUS | Status: DC | PRN

## 2018-12-08 MED ORDER — LACTATED RINGERS IV SOLN
INTRAVENOUS | Status: DC | PRN
  Administered 2018-12-08: 18:00:00 via INTRAVENOUS

## 2018-12-08 MED ORDER — SODIUM CHLORIDE 0.9% FLUSH: 3.0000 mL | INTRAVENOUS | Status: DC | PRN

## 2018-12-08 MED ORDER — NALOXONE HCL 0.4 MG/ML IJ SOLN: 0.4000 mg | INTRAMUSCULAR | Status: DC | PRN

## 2018-12-08 MED ORDER — PRENATAL MULTIVITAMIN CH: 1.0000 | ORAL_TABLET | Freq: Every day | ORAL | Status: DC

## 2018-12-08 MED ORDER — ONDANSETRON HCL 4 MG/2ML IJ SOLN: 4.0000 mg | INTRAMUSCULAR | Status: DC | PRN

## 2018-12-08 MED ORDER — LIDOCAINE HCL (PF) 1 % IJ SOLN: 30.0000 mL | INTRAMUSCULAR | Status: DC | PRN

## 2018-12-08 MED ORDER — METOCLOPRAMIDE HCL 5 MG/ML IJ SOLN
INTRAMUSCULAR | Status: DC | PRN
  Administered 2018-12-08: 10 mg via INTRAVENOUS

## 2018-12-08 MED ORDER — OXYCODONE HCL 5 MG PO TABS: 5.0000 mg | ORAL_TABLET | ORAL | Status: DC | PRN

## 2018-12-08 MED ORDER — DIBUCAINE 1 % RE OINT: 1.0000 "application " | TOPICAL_OINTMENT | RECTAL | Status: DC | PRN

## 2018-12-08 MED ORDER — OXYTOCIN 10 UNIT/ML IJ SOLN
INTRAVENOUS | Status: DC | PRN
  Administered 2018-12-08: 40 [IU] via INTRAVENOUS

## 2018-12-08 MED ORDER — MEASLES, MUMPS & RUBELLA VAC IJ SOLR: 0.5000 mL | Freq: Once | INTRAMUSCULAR | Status: DC

## 2018-12-08 MED ORDER — SCOPOLAMINE 1 MG/3DAYS TD PT72
MEDICATED_PATCH | TRANSDERMAL | Status: DC | PRN
  Administered 2018-12-08: 1 via TRANSDERMAL

## 2018-12-08 MED ORDER — NALBUPHINE HCL 10 MG/ML IJ SOLN: 5.0000 mg | INTRAMUSCULAR | Status: DC | PRN

## 2018-12-08 MED ORDER — PHENYLEPHRINE 40 MCG/ML (10ML) SYRINGE FOR IV PUSH (FOR BLOOD PRESSURE SUPPORT)
PREFILLED_SYRINGE | INTRAVENOUS | Status: AC
  Filled 2018-12-08: qty 10

## 2018-12-08 MED ORDER — OXYTOCIN 10 UNIT/ML IJ SOLN
INTRAMUSCULAR | Status: AC
  Filled 2018-12-08: qty 4

## 2018-12-08 MED ORDER — IBUPROFEN 800 MG PO TABS
800.0000 mg | ORAL_TABLET | Freq: Three times a day (TID) | ORAL | Status: DC
  Administered 2018-12-09 – 2018-12-12 (×8): 800 mg via ORAL
  Filled 2018-12-08 (×8): qty 1

## 2018-12-08 MED ORDER — TETANUS-DIPHTH-ACELL PERTUSSIS 5-2.5-18.5 LF-MCG/0.5 IM SUSP: 0.5000 mL | Freq: Once | INTRAMUSCULAR | Status: DC

## 2018-12-08 MED ORDER — OXYTOCIN BOLUS FROM INFUSION: 500.0000 mL | Freq: Once | INTRAVENOUS | Status: DC

## 2018-12-08 MED ORDER — NALBUPHINE HCL 10 MG/ML IJ SOLN: 5.0000 mg | Freq: Once | INTRAMUSCULAR | Status: DC | PRN

## 2018-12-08 MED ORDER — LACTATED RINGERS IV SOLN: INTRAVENOUS | Status: DC

## 2018-12-08 MED ORDER — ACETAMINOPHEN 325 MG PO TABS: 650.0000 mg | ORAL_TABLET | ORAL | Status: DC | PRN

## 2018-12-08 MED ORDER — SIMETHICONE 80 MG PO CHEW: 80.0000 mg | CHEWABLE_TABLET | ORAL | Status: DC

## 2018-12-08 MED ORDER — OXYCODONE HCL 5 MG PO TABS
5.0000 mg | ORAL_TABLET | ORAL | Status: DC | PRN
  Administered 2018-12-09 – 2018-12-10 (×5): 5 mg via ORAL
  Filled 2018-12-08 (×5): qty 1

## 2018-12-08 MED ORDER — ACETAMINOPHEN 10 MG/ML IV SOLN: 1000.0000 mg | Freq: Once | INTRAVENOUS | Status: DC | PRN

## 2018-12-08 MED ORDER — SOD CITRATE-CITRIC ACID 500-334 MG/5ML PO SOLN
ORAL | Status: AC
  Administered 2018-12-08: 30 mL via ORAL
  Filled 2018-12-08: qty 15

## 2018-12-08 MED ORDER — MORPHINE SULFATE (PF) 0.5 MG/ML IJ SOLN
INTRAMUSCULAR | Status: AC
  Filled 2018-12-08: qty 10

## 2018-12-08 MED ORDER — DIPHENHYDRAMINE HCL 25 MG PO CAPS: 25.0000 mg | ORAL_CAPSULE | Freq: Four times a day (QID) | ORAL | Status: DC | PRN

## 2018-12-08 MED ORDER — ONDANSETRON HCL 4 MG/2ML IJ SOLN: 4.0000 mg | Freq: Four times a day (QID) | INTRAMUSCULAR | Status: DC | PRN

## 2018-12-08 MED ORDER — OXYCODONE-ACETAMINOPHEN 5-325 MG PO TABS: 2.0000 | ORAL_TABLET | ORAL | Status: DC | PRN

## 2018-12-08 MED ORDER — LACTATED RINGERS IV SOLN
INTRAVENOUS | Status: DC | PRN
  Administered 2018-12-08 (×2): via INTRAVENOUS

## 2018-12-08 MED ORDER — BUPIVACAINE IN DEXTROSE 0.75-8.25 % IT SOLN
INTRATHECAL | Status: DC | PRN
  Administered 2018-12-08: 1.5 mL via INTRATHECAL

## 2018-12-08 MED ORDER — IBUPROFEN 600 MG PO TABS: 600.0000 mg | ORAL_TABLET | Freq: Four times a day (QID) | ORAL | Status: DC

## 2018-12-08 MED ORDER — WITCH HAZEL-GLYCERIN EX PADS: 1.0000 "application " | MEDICATED_PAD | CUTANEOUS | Status: DC | PRN

## 2018-12-08 MED ORDER — PHENYLEPHRINE 8 MG IN D5W 100 ML (0.08MG/ML) PREMIX OPTIME
INJECTION | INTRAVENOUS | Status: DC | PRN
  Administered 2018-12-08: 20 ug/min via INTRAVENOUS

## 2018-12-08 MED ORDER — LABETALOL HCL 5 MG/ML IV SOLN
INTRAVENOUS | Status: AC
  Administered 2018-12-08: 20 mg via INTRAVENOUS
  Filled 2018-12-08: qty 4

## 2018-12-08 MED ORDER — SIMETHICONE 80 MG PO CHEW: 80.0000 mg | CHEWABLE_TABLET | ORAL | Status: DC | PRN

## 2018-12-08 MED ORDER — OXYTOCIN 40 UNITS IN NORMAL SALINE INFUSION - SIMPLE MED: 2.5000 [IU]/h | INTRAVENOUS | Status: DC

## 2018-12-08 MED ORDER — LABETALOL HCL 5 MG/ML IV SOLN: 80.0000 mg | INTRAVENOUS | Status: DC | PRN

## 2018-12-08 MED ORDER — ZOLPIDEM TARTRATE 5 MG PO TABS: 5.0000 mg | ORAL_TABLET | Freq: Every evening | ORAL | Status: DC | PRN

## 2018-12-08 MED ORDER — FENTANYL CITRATE (PF) 100 MCG/2ML IJ SOLN
INTRAMUSCULAR | Status: AC
  Filled 2018-12-08: qty 2

## 2018-12-08 MED ORDER — ONDANSETRON HCL 4 MG/2ML IJ SOLN
INTRAMUSCULAR | Status: DC | PRN
  Administered 2018-12-08: 4 mg via INTRAVENOUS

## 2018-12-08 MED ORDER — HYDRALAZINE HCL 20 MG/ML IJ SOLN: 5.0000 mg | INTRAMUSCULAR | Status: DC | PRN

## 2018-12-08 MED ORDER — BENZOCAINE-MENTHOL 20-0.5 % EX AERO: 1.0000 "application " | INHALATION_SPRAY | CUTANEOUS | Status: DC | PRN

## 2018-12-08 MED ORDER — LABETALOL HCL 5 MG/ML IV SOLN: 20.0000 mg | INTRAVENOUS | Status: DC | PRN

## 2018-12-08 MED ORDER — MAGNESIUM SULFATE 40 G IN LACTATED RINGERS - SIMPLE: 2.0000 g/h | INTRAVENOUS | Status: DC

## 2018-12-08 MED ORDER — LABETALOL HCL 5 MG/ML IV SOLN
20.0000 mg | INTRAVENOUS | Status: DC | PRN
  Administered 2018-12-08: 20 mg via INTRAVENOUS

## 2018-12-08 MED ORDER — LABETALOL HCL 5 MG/ML IV SOLN: 40.0000 mg | INTRAVENOUS | Status: DC | PRN

## 2018-12-08 MED ORDER — ACETAMINOPHEN 160 MG/5ML PO SOLN: 325.0000 mg | ORAL | Status: DC | PRN

## 2018-12-08 MED ORDER — ONDANSETRON HCL 4 MG/2ML IJ SOLN
4.0000 mg | Freq: Three times a day (TID) | INTRAMUSCULAR | Status: DC | PRN
  Filled 2018-12-08: qty 2

## 2018-12-08 MED ORDER — COCONUT OIL OIL: 1.0000 "application " | TOPICAL_OIL | Status: DC | PRN

## 2018-12-08 MED ORDER — OXYCODONE HCL 5 MG/5ML PO SOLN: 5.0000 mg | Freq: Once | ORAL | Status: DC | PRN

## 2018-12-08 MED ORDER — DIPHENHYDRAMINE HCL 25 MG PO CAPS: 25.0000 mg | ORAL_CAPSULE | ORAL | Status: DC | PRN

## 2018-12-08 MED ORDER — COCONUT OIL OIL
1.0000 "application " | TOPICAL_OIL | Status: DC | PRN
  Administered 2018-12-11: 1 via TOPICAL
  Filled 2018-12-08: qty 120

## 2018-12-08 MED ORDER — ACETAMINOPHEN 325 MG PO TABS: 325.0000 mg | ORAL_TABLET | ORAL | Status: DC | PRN

## 2018-12-08 MED ORDER — STERILE WATER FOR IRRIGATION IR SOLN
Status: DC | PRN
  Administered 2018-12-08: 1000 mL

## 2018-12-08 MED ORDER — OXYTOCIN 40 UNITS IN NORMAL SALINE INFUSION - SIMPLE MED: 2.5000 [IU]/h | INTRAVENOUS | Status: AC

## 2018-12-08 MED ORDER — LABETALOL HCL 100 MG PO TABS
100.0000 mg | ORAL_TABLET | Freq: Two times a day (BID) | ORAL | Status: DC
  Administered 2018-12-08 – 2018-12-09 (×2): 100 mg via ORAL
  Filled 2018-12-08 (×2): qty 1

## 2018-12-08 MED ORDER — BUPIVACAINE IN DEXTROSE 0.75-8.25 % IT SOLN
INTRATHECAL | Status: AC
  Filled 2018-12-08: qty 2

## 2018-12-08 MED ORDER — CEFAZOLIN SODIUM-DEXTROSE 2-3 GM-%(50ML) IV SOLR
INTRAVENOUS | Status: DC | PRN
  Administered 2018-12-08: 2 g via INTRAVENOUS

## 2018-12-08 MED ORDER — ONDANSETRON HCL 4 MG PO TABS: 4.0000 mg | ORAL_TABLET | ORAL | Status: DC | PRN

## 2018-12-08 MED ORDER — LACTATED RINGERS IV SOLN: 500.0000 mL | INTRAVENOUS | Status: DC | PRN

## 2018-12-08 MED ORDER — ONDANSETRON HCL 4 MG/2ML IJ SOLN
4.0000 mg | Freq: Once | INTRAMUSCULAR | Status: AC | PRN
  Administered 2018-12-08: 4 mg via INTRAVENOUS

## 2018-12-08 MED ORDER — SOD CITRATE-CITRIC ACID 500-334 MG/5ML PO SOLN
30.0000 mL | ORAL | Status: DC | PRN
  Administered 2018-12-08: 30 mL via ORAL

## 2018-12-08 MED ORDER — DEXAMETHASONE SODIUM PHOSPHATE 4 MG/ML IJ SOLN
INTRAMUSCULAR | Status: AC
  Filled 2018-12-08: qty 1

## 2018-12-08 MED ORDER — LACTATED RINGERS IV SOLN
INTRAVENOUS | Status: DC
  Administered 2018-12-08 – 2018-12-09 (×2): via INTRAVENOUS

## 2018-12-08 MED ORDER — PHENYLEPHRINE 8 MG IN D5W 100 ML (0.08MG/ML) PREMIX OPTIME
INJECTION | INTRAVENOUS | Status: AC
  Filled 2018-12-08: qty 100

## 2018-12-08 MED ORDER — KETOROLAC TROMETHAMINE 30 MG/ML IJ SOLN
30.0000 mg | Freq: Four times a day (QID) | INTRAMUSCULAR | Status: AC
  Administered 2018-12-09 (×3): 30 mg via INTRAVENOUS
  Filled 2018-12-08 (×3): qty 1

## 2018-12-08 MED ORDER — FENTANYL CITRATE (PF) 100 MCG/2ML IJ SOLN: 25.0000 ug | INTRAMUSCULAR | Status: DC | PRN

## 2018-12-08 MED ORDER — METOCLOPRAMIDE HCL 5 MG/ML IJ SOLN
INTRAMUSCULAR | Status: AC
  Filled 2018-12-08: qty 2

## 2018-12-08 MED ORDER — ONDANSETRON HCL 4 MG/2ML IJ SOLN
INTRAMUSCULAR | Status: AC
  Filled 2018-12-08: qty 2

## 2018-12-08 MED ORDER — KETOROLAC TROMETHAMINE 30 MG/ML IJ SOLN
INTRAMUSCULAR | Status: DC | PRN
  Administered 2018-12-08: 30 mg via INTRAVENOUS

## 2018-12-08 MED ORDER — HYDRALAZINE HCL 20 MG/ML IJ SOLN: 10.0000 mg | INTRAMUSCULAR | Status: DC | PRN

## 2018-12-08 MED ORDER — DEXAMETHASONE SODIUM PHOSPHATE 10 MG/ML IJ SOLN
INTRAMUSCULAR | Status: AC
  Filled 2018-12-08: qty 1

## 2018-12-08 MED ORDER — SCOPOLAMINE 1 MG/3DAYS TD PT72: 1.0000 | MEDICATED_PATCH | Freq: Once | TRANSDERMAL | Status: DC

## 2018-12-08 MED ORDER — SIMETHICONE 80 MG PO CHEW
80.0000 mg | CHEWABLE_TABLET | Freq: Three times a day (TID) | ORAL | Status: DC
  Administered 2018-12-09 – 2018-12-12 (×9): 80 mg via ORAL
  Filled 2018-12-08 (×9): qty 1

## 2018-12-08 MED ORDER — KETOROLAC TROMETHAMINE 30 MG/ML IJ SOLN
INTRAMUSCULAR | Status: AC
  Filled 2018-12-08: qty 1

## 2018-12-08 MED ORDER — PRENATAL MULTIVITAMIN CH
1.0000 | ORAL_TABLET | Freq: Every day | ORAL | Status: DC
  Administered 2018-12-09 – 2018-12-11 (×3): 1 via ORAL
  Filled 2018-12-08 (×3): qty 1

## 2018-12-08 MED ORDER — MEPERIDINE HCL 25 MG/ML IJ SOLN: 6.2500 mg | INTRAMUSCULAR | Status: DC | PRN

## 2018-12-08 MED ORDER — SCOPOLAMINE 1 MG/3DAYS TD PT72
MEDICATED_PATCH | TRANSDERMAL | Status: AC
  Filled 2018-12-08: qty 1

## 2018-12-08 MED ORDER — OXYCODONE-ACETAMINOPHEN 5-325 MG PO TABS: 1.0000 | ORAL_TABLET | ORAL | Status: DC | PRN

## 2018-12-08 MED ORDER — FENTANYL CITRATE (PF) 100 MCG/2ML IJ SOLN
INTRAMUSCULAR | Status: DC | PRN
  Administered 2018-12-08: 15 ug via INTRATHECAL

## 2018-12-08 MED ORDER — MENTHOL 3 MG MT LOZG: 1.0000 | LOZENGE | OROMUCOSAL | Status: DC | PRN

## 2018-12-08 MED ORDER — MORPHINE SULFATE (PF) 0.5 MG/ML IJ SOLN
INTRAMUSCULAR | Status: DC | PRN
  Administered 2018-12-08: 150 ug via INTRATHECAL

## 2018-12-08 MED ORDER — DIPHENHYDRAMINE HCL 50 MG/ML IJ SOLN: 12.5000 mg | INTRAMUSCULAR | Status: DC | PRN

## 2018-12-08 MED ORDER — ENOXAPARIN SODIUM 40 MG/0.4ML ~~LOC~~ SOLN
40.0000 mg | SUBCUTANEOUS | Status: DC
  Administered 2018-12-09 – 2018-12-11 (×3): 40 mg via SUBCUTANEOUS
  Filled 2018-12-08 (×3): qty 0.4

## 2018-12-08 MED ORDER — NIFEDIPINE ER OSMOTIC RELEASE 30 MG PO TB24
60.0000 mg | ORAL_TABLET | Freq: Two times a day (BID) | ORAL | Status: DC
  Administered 2018-12-08 – 2018-12-09 (×2): 60 mg via ORAL
  Filled 2018-12-08 (×2): qty 2

## 2018-12-08 MED ORDER — OXYCODONE HCL 5 MG PO TABS: 5.0000 mg | ORAL_TABLET | Freq: Once | ORAL | Status: DC | PRN

## 2018-12-08 MED ORDER — MAGNESIUM SULFATE BOLUS VIA INFUSION: 4.0000 g | Freq: Once | INTRAVENOUS | Status: DC

## 2018-12-08 SURGICAL SUPPLY — 39 items
BENZOIN TINCTURE PRP APPL 2/3 (GAUZE/BANDAGES/DRESSINGS) ×3 IMPLANT
CHLORAPREP W/TINT 26ML (MISCELLANEOUS) ×3 IMPLANT
CLAMP CORD UMBIL (MISCELLANEOUS) IMPLANT
CLOSURE WOUND 1/2 X4 (GAUZE/BANDAGES/DRESSINGS) ×1
CLOTH BEACON ORANGE TIMEOUT ST (SAFETY) ×3 IMPLANT
DRAPE C SECTION CLR SCREEN (DRAPES) IMPLANT
DRSG OPSITE POSTOP 4X10 (GAUZE/BANDAGES/DRESSINGS) ×3 IMPLANT
ELECT REM PT RETURN 9FT ADLT (ELECTROSURGICAL) ×3
ELECTRODE REM PT RTRN 9FT ADLT (ELECTROSURGICAL) ×1 IMPLANT
EXTRACTOR VACUUM M CUP 4 TUBE (SUCTIONS) IMPLANT
EXTRACTOR VACUUM M CUP 4' TUBE (SUCTIONS)
GLOVE BIO SURGEON STRL SZ7.5 (GLOVE) ×3 IMPLANT
GLOVE BIOGEL PI IND STRL 7.0 (GLOVE) ×1 IMPLANT
GLOVE BIOGEL PI INDICATOR 7.0 (GLOVE) ×2
GOWN STRL REUS W/TWL 2XL LVL3 (GOWN DISPOSABLE) ×3 IMPLANT
GOWN STRL REUS W/TWL LRG LVL3 (GOWN DISPOSABLE) ×6 IMPLANT
KIT ABG SYR 3ML LUER SLIP (SYRINGE) IMPLANT
NEEDLE HYPO 22GX1.5 SAFETY (NEEDLE) ×3 IMPLANT
NEEDLE HYPO 25X5/8 SAFETYGLIDE (NEEDLE) IMPLANT
NS IRRIG 1000ML POUR BTL (IV SOLUTION) ×3 IMPLANT
PACK C SECTION WH (CUSTOM PROCEDURE TRAY) ×3 IMPLANT
PAD OB MATERNITY 4.3X12.25 (PERSONAL CARE ITEMS) ×3 IMPLANT
PENCIL SMOKE EVAC W/HOLSTER (ELECTROSURGICAL) ×3 IMPLANT
RTRCTR C-SECT PINK 25CM LRG (MISCELLANEOUS) ×3 IMPLANT
STRIP CLOSURE SKIN 1/2X4 (GAUZE/BANDAGES/DRESSINGS) ×2 IMPLANT
SUT CHROMIC 1 CTX 36 (SUTURE) ×9 IMPLANT
SUT VIC AB 1 CT1 36 (SUTURE) ×6 IMPLANT
SUT VIC AB 2-0 CT1 (SUTURE) ×3 IMPLANT
SUT VIC AB 2-0 CT1 27 (SUTURE) ×2
SUT VIC AB 2-0 CT1 TAPERPNT 27 (SUTURE) ×1 IMPLANT
SUT VIC AB 3-0 CT1 27 (SUTURE) ×4
SUT VIC AB 3-0 CT1 TAPERPNT 27 (SUTURE) ×2 IMPLANT
SUT VIC AB 3-0 SH 27 (SUTURE)
SUT VIC AB 3-0 SH 27X BRD (SUTURE) IMPLANT
SUT VIC AB 4-0 KS 27 (SUTURE) ×3 IMPLANT
SYR BULB IRRIGATION 50ML (SYRINGE) IMPLANT
TOWEL OR 17X24 6PK STRL BLUE (TOWEL DISPOSABLE) ×3 IMPLANT
TRAY FOLEY W/BAG SLVR 14FR LF (SET/KITS/TRAYS/PACK) ×3 IMPLANT
WATER STERILE IRR 1000ML POUR (IV SOLUTION) ×3 IMPLANT

## 2018-12-08 NOTE — ED Notes (Signed)
Patient to be admitted for delivery. Dr. Judeth CornfieldShankar called report to Dr. Alysia PennaErvin, L&D charge nurse, [facility] Gagnon, [facility] and NICU. Patient was transported to Room 161 via W/C

## 2018-12-08 NOTE — Anesthesia Preprocedure Evaluation (Addendum)
Anesthesia Evaluation  Patient identified by MRN, date of birth, ID band Patient awake    Reviewed: Allergy & Precautions, NPO status , Patient's Chart, lab work & pertinent test results  Airway Mallampati: II  TM Distance: >3 FB Neck ROM: Full    Dental no notable dental hx. (+) Teeth Intact, Dental Advisory Given   Pulmonary asthma ,    Pulmonary exam normal breath sounds clear to auscultation       Cardiovascular hypertension, Pt. on medications negative cardio ROS Normal cardiovascular exam Rhythm:Regular Rate:Normal     Neuro/Psych  Headaches, PSYCHIATRIC DISORDERS Anxiety    GI/Hepatic negative GI ROS, Neg liver ROS,   Endo/Other  negative endocrine ROS  Renal/GU negative Renal ROS  negative genitourinary   Musculoskeletal negative musculoskeletal ROS (+)   Abdominal   Peds  Hematology  (+) Blood dyscrasia, anemia ,   Anesthesia Other Findings 27yo G2P0 at 29/[redacted] weeks gestation for C/S 2/2 reverse end diastolic flow on fetal ultrasound  Last solids 1pm  Reproductive/Obstetrics                            Anesthesia Physical Anesthesia Plan  ASA: III  Anesthesia Plan: Spinal   Post-op Pain Management:    Induction:   PONV Risk Score and Plan: Treatment may vary due to age or medical condition  Airway Management Planned: Natural Airway  Additional Equipment:   Intra-op Plan:   Post-operative Plan:   Informed Consent: I have reviewed the patients History and Physical, chart, labs and discussed the procedure including the risks, benefits and alternatives for the proposed anesthesia with the patient or authorized representative who has indicated his/her understanding and acceptance.     Dental advisory given  Plan Discussed with: CRNA  Anesthesia Plan Comments:         Anesthesia Quick Evaluation

## 2018-12-08 NOTE — Telephone Encounter (Signed)
Patient's ins no longer covers EQ Allergy Relief 180 mg tablets.   Options are :   Preferred:  Cetirizine Levocetirizine Loratadine  Loratadine D   Please advise which medication patient will begi using.

## 2018-12-08 NOTE — Procedures (Signed)
Dawn Hoffman 1991-08-07 [redacted]w[redacted]d  Fetus A Non-Stress Test Interpretation for 12/08/18  Indication: IUGR  Fetal Heart Rate A Baseline Rate (A): 145 bpm Variability: Minimal Accelerations: None Decelerations: Variable Multiple birth?: No  Uterine Activity Mode: Palpation, Toco Contraction Frequency (min): None Resting Tone Palpated: Relaxed Resting Time: Adequate  Interpretation (Fetal Testing) Nonstress Test Interpretation: Non-reactive Overall Impression: Non-reassuring Comments: Documentation per Dr. Judeth Cornfield review

## 2018-12-08 NOTE — H&P (Signed)
Obstetric Preoperative History and Physical  Dawn Hoffman is a 28 y.o. G2P0010 with IUP at 7746w6d presenting for c-section. Patient with history of IUGR with reverse end diastolic flow. EFW 508g, <10% on 1/22. Patient presented to MFM today, found to have BPP 0/8 with concern for fetal cranial hemorrhage. Immediate delivery recommended.   Prenatal Course Source of Care: [facility]  with onset of care at 18 weeks Pregnancy complications or risks: Patient Active Problem List   Diagnosis Date Noted  . Indication for care in labor or delivery 12/08/2018  . Abnormal fetal ultrasound 11/10/2018  . Encounter for supervision of normal first pregnancy in second trimester 09/17/2018  . MIGRAINE, UNSPEC., W/O INTRACTABLE MIGRAINE 01/02/2007  . HYPERTENSION, BENIGN SYSTEMIC 01/02/2007  . RHINITIS, ALLERGIC 01/02/2007  . ASTHMA, UNSPECIFIED 01/02/2007    Prenatal labs and studies: ABO, Rh: --/--/B POS, B POS Performed at [facility], [address] [place], [place] [address]  [phone](01/06 2342) Antibody: NEG (01/06 2342) Rubella: 2.00 (11/14 1009) RPR: Non Reactive (11/14 1009)  HBsAg: Negative (11/14 1009)  HIV: Non Reactive (11/14 1009)  GBS: Unknown  1 hr Glucola  Unknown  Genetic screening abnormal with elevated AFP Anatomy US abnormal with IUGR   Prenatal Transfer Tool  Maternal Diabetes: No Genetic Screening: Abnormal:  Results: Elevated AFP Maternal Ultrasounds/Referrals: Normal Fetal Ultrasounds or other Referrals:  Referred to Materal Fetal Medicine  Maternal Substance Abuse:  No Significant Maternal Medications:  Meds include: Other: ASA, Labetalol, Procardia  Significant Maternal Lab Results: None  Past Medical History:  Diagnosis Date  . Anemia   . Anxiety   . Asthma   . Dyspnea   . Headache   . Hypertension     History reviewed. No pertinent surgical history.  OB History  Gravida Para Term Preterm AB Living  2       1 0  SAB TAB Ectopic Multiple Live Births   1            # Outcome Date GA Lbr Len/2nd Weight Sex Delivery Anes PTL Lv  2 Current           1 SAB             Social History   Socioeconomic History  . Marital status: Single    Spouse name: Not on file  . Number of children: Not on file  . Years of education: Not on file  . Highest education level: Not on file  Occupational History  . Occupation: Magazine features editorTeacher    Employer: [employer]  Social Needs  . Financial resource strain: Not hard at all  . Food insecurity:    Worry: Never true    Inability: Not on file  . Transportation needs:    Medical: No    Non-medical: No  Tobacco Use  . Smoking status: Never Smoker  . Smokeless tobacco: Never Used  Substance and Sexual Activity  . Alcohol use: No  . Drug use: No  . Sexual activity: Yes  Lifestyle  . Physical activity:    Days per week: Not on file    Minutes per session: Not on file  . Stress: Not on file  Relationships  . Social connections:    Talks on phone: Not on file    Gets together: Not on file    Attends religious service: Not on file    Active member of club or organization: Not on file    Attends meetings of clubs or organizations: Not  on file    Relationship status: Not on file  Other Topics Concern  . Not on file  Social History Narrative  . Not on file    Family History  Problem Relation Age of Onset  . Alcohol abuse Father   . Drug abuse Father   . Cancer Maternal Grandmother   . Diabetes Maternal Grandfather     Facility-Administered Medications Prior to Admission  Medication Dose Route Frequency Provider Last Rate Last Dose  . Tdap (BOOSTRIX) injection 0.5 mL  0.5 mL Intramuscular Once Brock Bad, MD       Medications Prior to Admission  Medication Sig Dispense Refill Last Dose  . aspirin EC 81 MG tablet Take 81 mg by mouth daily.   Taking  . Elastic Bandages & Supports (COMFORT FIT MATERNITY SUPP SM) MISC Wear as directed. 1 each 0 Taking  . fexofenadine  (ALLEGRA) 180 MG tablet Take 1 tablet (180 mg total) by mouth daily. 30 tablet 11 Taking  . labetalol (NORMODYNE) 100 MG tablet Take 1 tablet (100 mg total) by mouth 2 (two) times daily. 60 tablet 1 Taking  . NIFEdipine (PROCARDIA XL) 60 MG 24 hr tablet Take 1 tablet (60 mg total) by mouth 2 (two) times daily. 60 tablet 1 Taking  . prenatal vitamin w/FE, FA (PRENATAL 1 + 1) 27-1 MG TABS tablet Take 1 tablet by mouth daily before breakfast. 90 each 4 Taking  . valACYclovir (VALTREX) 1000 MG tablet Take 0.5 tablets (500 mg total) by mouth 2 (two) times daily. (Patient not taking: Reported on 11/17/2018) 60 tablet 1 Not Taking    No Known Allergies  Review of Systems: Negative except for what is mentioned in HPI.  Physical Exam: BP (!) 164/92   Pulse 88   LMP 05/13/2018 (Exact Date)  CONSTITUTIONAL: Well-developed, well-nourished female in no acute distress.  HENT:  Normocephalic, atraumatic. Oropharynx is clear and moist EYES: Conjunctivae and EOM are normal. No scleral icterus.  NECK: Normal range of motion, supple SKIN: Skin is warm and dry. No rash noted. Not diaphoretic. No erythema. NEUROLGIC: Alert and oriented to person, place, and time. Normal reflexes, muscle tone coordination. No cranial nerve deficit noted. PSYCHIATRIC: Normal mood and affect. Normal behavior. CARDIOVASCULAR: Normal heart rate noted, regular rhythm RESPIRATORY: Effort and breath sounds normal, no problems with respiration noted ABDOMEN: Soft, nontender, nondistended, gravid.  PELVIC: Deferred MUSCULOSKELETAL: Normal range of motion. No edema and no tenderness. 2+ distal pulses.  FHT: 145 bpm, minimal variability, no acels, variable decels  Toco: None    Pertinent Labs/Studies:   Results for orders placed or performed during the hospital encounter of 12/08/18 (from the past 72 hour(s))  CBC     Status: Abnormal   Collection Time: 12/08/18  5:41 PM  Result Value Ref Range   WBC 12.9 (H) 4.0 - 10.5 K/uL    RBC 4.51 3.87 - 5.11 MIL/uL   Hemoglobin 12.2 12.0 - 15.0 g/dL   HCT 09.3 81.8 - 29.9 %   MCV 80.0 80.0 - 100.0 fL   MCH 27.1 26.0 - 34.0 pg   MCHC 33.8 30.0 - 36.0 g/dL   RDW 37.1 69.6 - 78.9 %   Platelets 248 150 - 400 K/uL   nRBC 0.0 0.0 - 0.2 %    Comment: Performed at [facility], [address], [place], [place] [address]    Assessment and Plan :Dawn Hoffman is a 28 y.o. G2P0010 at [redacted]w[redacted]d being admitted for cesarean section due to  BPP 0/8 with concern for fetal cranial hemorrhage on MFM ultrasound today. Pregnancy complicated by IUGR with reverse end diastolic flow. Pregnancy also complicated by cHTN on Labetalol and Procardia. Patient presenting with severe range BPs. Will start Labetalol protocol now and orders to start Mg++ bolus and infusion after delivery of fetus placed. Case has been discussed with NICU.   The risks of cesarean section discussed with the patient included but were not limited to: bleeding which may require transfusion or reoperation; infection which may require antibiotics; injury to bowel, bladder, ureters or other surrounding organs; injury to the fetus; need for additional procedures including hysterectomy in the event of a life-threatening hemorrhage; placental abnormalities wth subsequent pregnancies, incisional problems, thromboembolic phenomenon and other postoperative/anesthesia complications. The patient concurred with the proposed plan, giving informed written consent for the procedure. Patient has been NPO since 1300 she will remain NPO for procedure. Anesthesia and OR aware. Preoperative prophylactic antibiotics and SCDs ordered on call to the OR. To OR immediately.    Marcy Siren, D.O. OB Fellow  12/08/2018, 5:56 PM

## 2018-12-08 NOTE — Transfer of Care (Signed)
Immediate Anesthesia Transfer of Care Note  Patient: Dawn Hoffman  Procedure(s) Performed: CESAREAN SECTION (N/A Abdomen)  Patient Location: PACU  Anesthesia Type:Spinal  Level of Consciousness: awake, alert  and oriented  Airway & Oxygen Therapy: Patient Spontanous Breathing  Post-op Assessment: Report given to RN and Post -op Vital signs reviewed and stable  Post vital signs: Reviewed and stable  Last Vitals:  Vitals Value Taken Time  BP    Temp    Pulse    Resp    SpO2      Last Pain: There were no vitals filed for this visit.       Complications: No apparent anesthesia complications

## 2018-12-08 NOTE — Anesthesia Procedure Notes (Signed)
Spinal  Patient location during procedure: OR Start time: 12/08/2018 6:00 PM End time: 12/08/2018 6:02 PM Staffing Anesthesiologist: Bethena Midgetddono, Ernest, MD Performed: other anesthesia staff  Preanesthetic Checklist Completed: patient identified, surgical consent, pre-op evaluation, timeout performed, IV checked, risks and benefits discussed and monitors and equipment checked Spinal Block Patient position: sitting Prep: ChloraPrep Patient monitoring: heart rate, continuous pulse ox, cardiac monitor and blood pressure Approach: midline Location: L3-4 Injection technique: single-shot Needle Needle type: Pencan  Needle gauge: 24 G Needle length: 10 cm Assessment Sensory level: T4 Additional Notes Inserted by Huey BienenstockNicholas Tuttle, SRNA

## 2018-12-08 NOTE — Op Note (Signed)
Cesarean Section Operative Report  PATIENT: Dawn Hoffman  PROCEDURE DATE: 12/08/2018  PREOPERATIVE DIAGNOSES: Intrauterine pregnancy at 5271w6d weeks gestation; non-reassuring fetal status with BPP 0/8, concern for fetal cerebral hemorrhage, and IUGR with reverse end diastolic flow  POSTOPERATIVE DIAGNOSES: The same  PROCEDURE: Primary Low Transverse Cesarean Section  SURGEON:   Surgeon(s) and Role:    * Hermina StaggersErvin, Michael L, MD - Primary - Attending   Marcy Sirenatherine , DO- OB Fellow   INDICATIONS: Dawn Hoffman is a 28 y.o. G2P0111 at 271w6d here for cesarean section secondary to the indications listed under preoperative diagnoses; please see preoperative note for further details.  The risks of cesarean section were discussed with the patient including but were not limited to: bleeding which may require transfusion or reoperation; infection which may require antibiotics; injury to bowel, bladder, ureters or other surrounding organs; injury to the fetus; need for additional procedures including hysterectomy in the event of a life-threatening hemorrhage; placental abnormalities wth subsequent pregnancies, incisional problems, thromboembolic phenomenon and other postoperative/anesthesia complications.   The patient concurred with the proposed plan, giving informed written consent for the procedure.    FINDINGS:  Viable female infant in cephalic presentation.  Apgars 1 and 6.  Weight 570 g. Light meconium stained amniotic fluid.  Intact placenta, three vessel cord.  Normal uterus, fallopian tubes and ovaries bilaterally.  ANESTHESIA: Spinal INTRAVENOUS FLUIDS: 1500 mL  ESTIMATED BLOOD LOSS: 181 mL URINE OUTPUT:  600 ml SPECIMENS: Placenta sent to pathology COMPLICATIONS: None immediate  PROCEDURE IN DETAIL:  The patient preoperatively received intravenous antibiotics and had sequential compression devices applied to her lower extremities.  She was then taken to the operating room where  spinal anesthesia was administered and was found to be adequate. She was then placed in a dorsal supine position with a leftward tilt, and prepped and draped in a sterile manner.  A foley catheter was placed into her bladder and attached to constant gravity.    After an adequate timeout was performed, a Pfannenstiel skin incision was made with scalpel and carried through to the underlying layer of fascia. The fascia was incised in the midline, and this incision was extended bilaterally using the Mayo scissors.  Kocher clamps were applied to the superior aspect of the fascial incision and the underlying rectus muscles were dissected off bluntly.  A similar process was carried out on the inferior aspect of the fascial incision. The rectus muscles were separated in the midline bluntly and the peritoneum was entered bluntly. Attention was turned to the lower uterine segment where a low transverse hysterotomy was made with a scalpel and extended bilaterally bluntly.  The infant was successfully delivered, the cord was clamped and cut immediately, and the infant was handed over to the awaiting neonatology team. Uterine massage was then administered, and the placenta delivered intact with a three-vessel cord. The uterus was then cleared of clots and debris.  The hysterotomy was closed with 0 Chromic in a running locked fashion, and a second layer closure was performed with 0 Chromic in a running locked fashion.  Figure-of-eight 0 Chromic serosal stitches were placed to help with hemostasis.  The pelvis was cleared of all clot and debris. Hemostasis was confirmed on all surfaces.  The peritoneum and rectus muscles were closed with a 0 Vicryl running stitch. The fascia was then closed using 0 Vicryl in a running fashion.  The subcutaneous layer was irrigated. The skin was closed with a 4-0 Vicryl subcuticular stitch.  The patient tolerated the procedure well. Sponge, lap, instrument and needle counts were correct x 3.   She was taken to the recovery room in stable condition.   An experienced assistant was required given the standard of surgical care given the complexity of the case.  This assistant was needed for exposure, dissection, suctioning, retraction, instrument exchange, assisting with delivery with administration of fundal pressure, and for overall help during the procedure.   Maternal Disposition: PACU - hemodynamically stable.   Infant Disposition: To NICU   Marcy Sirenatherine , D.O. OB Fellow  12/08/2018, 7:16 PM

## 2018-12-09 ENCOUNTER — Other Ambulatory Visit: Payer: Self-pay | Admitting: Obstetrics

## 2018-12-09 ENCOUNTER — Encounter (HOSPITAL_COMMUNITY): Payer: Self-pay | Admitting: *Deleted

## 2018-12-09 DIAGNOSIS — T7840XD Allergy, unspecified, subsequent encounter: Secondary | ICD-10-CM

## 2018-12-09 LAB — CREATININE, SERUM: Creatinine, Ser: 0.51 mg/dL (ref 0.44–1.00)

## 2018-12-09 LAB — CBC
HCT: 27.6 % — ABNORMAL LOW (ref 36.0–46.0)
Hemoglobin: 9.4 g/dL — ABNORMAL LOW (ref 12.0–15.0)
MCH: 26.9 pg (ref 26.0–34.0)
MCHC: 34.1 g/dL (ref 30.0–36.0)
MCV: 78.9 fL — ABNORMAL LOW (ref 80.0–100.0)
NRBC: 0 % (ref 0.0–0.2)
Platelets: 231 10*3/uL (ref 150–400)
RBC: 3.5 MIL/uL — ABNORMAL LOW (ref 3.87–5.11)
RDW: 13.5 % (ref 11.5–15.5)
WBC: 15.1 10*3/uL — AB (ref 4.0–10.5)

## 2018-12-09 LAB — RPR: RPR Ser Ql: NONREACTIVE

## 2018-12-09 MED ORDER — POLYETHYLENE GLYCOL 3350 17 G PO PACK
17.0000 g | PACK | Freq: Every day | ORAL | Status: DC
  Administered 2018-12-10 – 2018-12-12 (×3): 17 g via ORAL
  Filled 2018-12-09 (×3): qty 1

## 2018-12-09 MED ORDER — NIFEDIPINE ER OSMOTIC RELEASE 30 MG PO TB24
30.0000 mg | ORAL_TABLET | Freq: Every day | ORAL | Status: DC
  Administered 2018-12-10 – 2018-12-12 (×3): 30 mg via ORAL
  Filled 2018-12-09 (×3): qty 1

## 2018-12-09 MED ORDER — LORATADINE 10 MG PO TABS: 10.0000 mg | ORAL_TABLET | Freq: Every day | ORAL | 11 refills | Status: DC

## 2018-12-09 MED ORDER — SENNOSIDES-DOCUSATE SODIUM 8.6-50 MG PO TABS
2.0000 | ORAL_TABLET | Freq: Every evening | ORAL | Status: DC | PRN
  Administered 2018-12-11: 2 via ORAL
  Filled 2018-12-09: qty 2

## 2018-12-09 MED ORDER — NIFEDIPINE ER OSMOTIC RELEASE 30 MG PO TB24: 60.0000 mg | ORAL_TABLET | Freq: Every day | ORAL | Status: DC

## 2018-12-09 NOTE — Lactation Note (Signed)
This note was copied from a baby's chart. Lactation Consultation Note Initial visit with this mom of NICU baby born at 29w 6 day weight 1 lb 4 oz. Mom called for assist with pumping. Has pumped once and wants review. Reviewed setup, use and cleaning of pump pieces with mom and grandmother. Reviewed hand expression with mom. She pumped for 15 min. No Colostrum collected. Suggested trying # 27 flanges at next pumping. Encouragement given. Encouraged to pump 8 times/24 hours. Has NICU booklet. BF brochure given. Reviewed our phone number to call with questions/concerns Mom has WIC- I will send referral for pump for home to them. No questions at present.    Patient Name: Dawn Hoffman [id number] Date: 12/09/2018 Reason for consult: NICU baby;Preterm <34wks;1st time breastfeeding;Initial assessment   Maternal Data Formula Feeding for Exclusion: Yes Reason for exclusion: Admission to Intensive Care Unit (ICU) post-partum Has patient been taught Hand Expression?: Yes Does the patient have breastfeeding experience prior to this delivery?: No  Feeding    LATCH Score                   Interventions    Lactation Tools Discussed/Used WIC Program: Yes Pump Review: Setup, frequency, and cleaning Initiated by:: RN Date initiated:: 12/09/18   Consult Status Consult Status: Follow-up Date: 12/10/18 Follow-up type: In-patient    Pamelia Hoit 12/09/2018, 8:08 AM

## 2018-12-09 NOTE — Plan of Care (Signed)
  Problem: Skin Integrity: Goal: Demonstration of wound healing without infection will improve Outcome: Progressing   Problem: Activity: Goal: Risk for activity intolerance will decrease Outcome: Progressing   Problem: Safety: Goal: Ability to remain free from injury will improve Outcome: Progressing   Problem: Skin Integrity: Goal: Risk for impaired skin integrity will decrease Outcome: Progressing

## 2018-12-09 NOTE — Telephone Encounter (Signed)
Loratadine Rx for allergies.

## 2018-12-09 NOTE — Anesthesia Postprocedure Evaluation (Signed)
Anesthesia Post Note  Patient: Dawn Hoffman  Procedure(s) Performed: CESAREAN SECTION (N/A Abdomen)     Patient location during evaluation: PACU Anesthesia Type: Spinal Level of consciousness: oriented and awake and alert Pain management: pain level controlled Vital Signs Assessment: post-procedure vital signs reviewed and stable Respiratory status: spontaneous breathing, respiratory function stable and patient connected to nasal cannula oxygen Cardiovascular status: blood pressure returned to baseline and stable Postop Assessment: no headache, no backache and no apparent nausea or vomiting Anesthetic complications: no    Last Vitals:  Vitals:   12/09/18 0004 12/09/18 0100  BP: (!) 141/85   Pulse: 68   Resp: 17   Temp: 36.7 C   SpO2:  98%    Last Pain:  Vitals:   12/09/18 0004  TempSrc: Oral  PainSc: 0-No pain   Pain Goal:                   Kerolos Nehme

## 2018-12-09 NOTE — Progress Notes (Signed)
OB Note Patient not in room. Will come by later to see.  [name] MD Attending Center for [employer] (Faculty Practice) 12/09/2018 Time: [phone]

## 2018-12-09 NOTE — Progress Notes (Signed)
Daily Postpartum Note  Admission Date: 12/08/2018 Current Date: 12/09/2018 1:57 PM  Dawn Hoffman is a 28 y.o. [id number] POD#1 pLTCS @ 29wks for FGR, REDF.  Pregnancy complicated by: Patient Active Problem List   Diagnosis Date Noted  . Indication for care in labor or delivery 12/08/2018  . Abnormal fetal ultrasound 11/10/2018  . Encounter for supervision of normal first pregnancy in second trimester 09/17/2018  . MIGRAINE, UNSPEC., W/O INTRACTABLE MIGRAINE 01/02/2007  . HYPERTENSION, BENIGN SYSTEMIC 01/02/2007  . RHINITIS, ALLERGIC 01/02/2007  . ASTHMA, UNSPECIFIED 01/02/2007    Overnight/24hr events:  none  Subjective:  +void after foley out, flatus, ambulation, pain controlled, taking PO well.   Objective:    Current Vital Signs 24h Vital Sign Ranges  T 98.9 F (37.2 C) Temp  Avg: 98.3 F (36.8 C)  Min: 97.6 F (36.4 C)  Max: 98.9 F (37.2 C)  BP (!) 143/81 BP  Min: 105/72  Max: 170/115  HR 67 Pulse  Avg: 73.9  Min: 61  Max: 101  RR 18 Resp  Avg: 17.3  Min: 15  Max: 21  SaO2 99 % Room Air SpO2  Avg: 98.3 %  Min: 97 %  Max: 100 %       24 Hour I/O Current Shift I/O  Time Ins Outs 02/03 0701 - 02/04 0700 In: 2345.8 [I.V.:2345.8] Out: 1056 [Urine:875] 02/04 0701 - 02/04 1900 In: 840 [P.O.:840] Out: 675 [Urine:675]   Patient Vitals for the past 24 hrs:  BP Temp Temp src Pulse Resp SpO2 Height Weight  12/09/18 1153 (!) 143/81 98.9 F (37.2 C) Oral 67 18 99 % - -  12/09/18 0833 135/84 98.6 F (37 C) Oral 66 18 99 % - -  12/09/18 0300 - - - - - 97 % - -  12/09/18 0200 - - - - - 98 % - -  12/09/18 0100 - - - - - 98 % - -  12/09/18 0004 (!) 141/85 98 F (36.7 C) Oral 68 17 - - -  12/09/18 0000 - - - - - 98 % - -  12/08/18 2300 134/86 - - 66 16 98 % - -  12/08/18 2200 (!) 148/87 97.8 F (36.6 C) Oral 70 18 99 % - -  12/08/18 2100 (!) 128/99 98.9 F (37.2 C) Oral 72 18 99 % 5' (1.524 m) 89.4 kg  12/08/18 2000 128/86 97.6 F (36.4 C) - 66 15 100 % - -   12/08/18 1950 - - - 88 15 98 % - -  12/08/18 1945 119/79 - - 68 (!) 21 98 % - -  12/08/18 1930 119/74 98 F (36.7 C) - 63 15 98 % - -  12/08/18 1915 116/82 - - 61 20 98 % - -  12/08/18 1905 105/72 98.3 F (36.8 C) - 63 17 98 % - -  12/08/18 1748 (!) 162/100 - - (!) 101 - - - -  12/08/18 1742 (!) 164/92 - - 88 - - - -  12/08/18 1736 (!) 170/115 - - (!) 101 - - - -    Physical exam: General: Well nourished, well developed female in no acute distress. Abdomen: +BS, soft, mildly distended, nttp, c/d/i honeycomb dressing with small area of old blood Cardiovascular: S1, S2 normal, no murmur, rub or gallop, regular rate and rhythm Respiratory: CTAB Extremities: no clubbing, cyanosis or edema Skin: Warm and dry.   Medications: Current Facility-Administered Medications  Medication Dose Route Frequency Provider Last Rate Last Dose  .  coconut oil  1 application Topical PRN Arvilla Market, DO      . witch hazel-glycerin (TUCKS) pad 1 application  1 application Topical PRN Arvilla Market, DO       And  . dibucaine (NUPERCAINAL) 1 % rectal ointment 1 application  1 application Rectal PRN Arvilla Market, DO      . diphenhydrAMINE (BENADRYL) injection 12.5 mg  12.5 mg Intravenous Q4H PRN Woodrum, Chelsey L, MD       Or  . diphenhydrAMINE (BENADRYL) capsule 25 mg  25 mg Oral Q4H PRN Woodrum, Chelsey L, MD      . diphenhydrAMINE (BENADRYL) capsule 25 mg  25 mg Oral Q6H PRN Arvilla Market, DO      . enoxaparin (LOVENOX) injection 40 mg  40 mg Subcutaneous Q24H Arvilla Market, DO      . ibuprofen (ADVIL,MOTRIN) tablet 800 mg  800 mg Oral Q8H Arvilla Market, DO      . labetalol (NORMODYNE) tablet 100 mg  100 mg Oral BID Arvilla Market, DO   100 mg at 12/09/18 1201  . lactated ringers infusion   Intravenous Continuous Arvilla Market, DO 125 mL/hr at 12/09/18 0518    . menthol-cetylpyridinium (CEPACOL) lozenge 3 mg   1 lozenge Oral Q2H PRN Arvilla Market, DO      . nalbuphine (NUBAIN) injection 5 mg  5 mg Intravenous Q4H PRN Woodrum, Chelsey L, MD       Or  . nalbuphine (NUBAIN) injection 5 mg  5 mg Subcutaneous Q4H PRN Woodrum, Chelsey L, MD      . nalbuphine (NUBAIN) injection 5 mg  5 mg Intravenous Once PRN Woodrum, Chelsey L, MD       Or  . nalbuphine (NUBAIN) injection 5 mg  5 mg Subcutaneous Once PRN Woodrum, Chelsey L, MD      . naloxone (NARCAN) injection 0.4 mg  0.4 mg Intravenous PRN Woodrum, Chelsey L, MD       And  . sodium chloride flush (NS) 0.9 % injection 3 mL  3 mL Intravenous PRN Woodrum, Chelsey L, MD      . naloxone HCl (NARCAN) 2 mg in dextrose 5 % 250 mL infusion  1-4 mcg/kg/hr Intravenous Continuous PRN Woodrum, Chelsey L, MD      . NIFEdipine (PROCARDIA-XL/NIFEDICAL-XL) 24 hr tablet 60 mg  60 mg Oral BID Arvilla Market, DO   60 mg at 12/09/18 1202  . ondansetron (ZOFRAN) injection 4 mg  4 mg Intravenous Q8H PRN Woodrum, Chelsey L, MD      . oxyCODONE (Oxy IR/ROXICODONE) immediate release tablet 5-10 mg  5-10 mg Oral Q4H PRN Arvilla Market, DO      . prenatal multivitamin tablet 1 tablet  1 tablet Oral Q1200 Arvilla Market, DO   1 tablet at 12/09/18 1202  . scopolamine (TRANSDERM-SCOP) 1 MG/3DAYS 1.5 mg  1 patch Transdermal Once Woodrum, Chelsey L, MD      . senna-docusate (Senokot-S) tablet 2 tablet  2 tablet Oral Q24H Arvilla Market, DO      . simethicone [facility]) chewable tablet 80 mg  80 mg Oral TID PC Arvilla Market, DO   80 mg at 12/09/18 1202  . simethicone (MYLICON) chewable tablet 80 mg  80 mg Oral Q24H Arvilla Market, DO      . simethicone [facility]) chewable tablet 80 mg  80 mg Oral PRN Arvilla Market, DO      .  Tdap (BOOSTRIX) injection 0.5 mL  0.5 mL Intramuscular Once Arvilla MarketWallace, Catherine Lauren, DO      . zolpidem (AMBIEN) tablet 5 mg  5 mg Oral QHS PRN Arvilla MarketWallace, Catherine Lauren, DO         Labs:  Recent Labs  Lab 12/08/18 1741 12/09/18 0554  WBC 12.9* 15.1*  HGB 12.2 9.4*  HCT 36.1 27.6*  PLT 248 231    Recent Labs  Lab 12/09/18 0554  CREATININE 0.51     Radiology: no new imagin  Assessment & Plan:  Pt doing well *PP: routine care. Pumping. *cHTN: pt just on hctz prior to pregnancy. Will change to just procardia xl 30 qday. Continue to follow closely *PPx: lovenox starting tonight. *FEN/GI: regular diet *Dispo: likely pod#3  Cornelia Copaharlie Ladora Osterberg, Jr. MD Attending Center [facility](Faculty Practice)

## 2018-12-10 ENCOUNTER — Ambulatory Visit (HOSPITAL_COMMUNITY): Payer: BLUE CROSS/BLUE SHIELD

## 2018-12-10 NOTE — Lactation Note (Signed)
This note was copied from a baby's chart. Lactation Consultation Note  Patient Name: Dawn Hoffman GGYIR'S Date: 12/10/2018     [facility] visited mom.  Mom was visiting with her mom and brother on skype.  Mom denies questions regarding pumping.  She states she did not get anything out but has pumped.  LC referred to NICU booklet and encouraged mom, to continue to pump if she desired, that it may take multiple times of pumping to collect drops-mls.  LC encouraged mom to call out if she had any questions for lactation or needed assistance pumping.     Maternal Data    Feeding    LATCH Score                   Interventions    Lactation Tools Discussed/Used     Consult Status      Maryruth Hancock [facility] 12/10/2018, 9:44 AM

## 2018-12-10 NOTE — Plan of Care (Signed)
  Problem: Skin Integrity: Goal: Demonstration of wound healing without infection will improve Outcome: Progressing   Problem: Safety: Goal: Ability to remain free from injury will improve Outcome: Progressing   Problem: Skin Integrity: Goal: Risk for impaired skin integrity will decrease Outcome: Progressing

## 2018-12-10 NOTE — Progress Notes (Signed)
Daily Postpartum Note  Admission Date: 12/08/2018 Current Date: 12/10/2018 9:41 AM  Dawn Hoffman is a 28 y.o. [id number] POD#2 pLTCS @ 29wks for FGR, REDF.  Pregnancy complicated by: Patient Active Problem List   Diagnosis Date Noted  . Indication for care in labor or delivery 12/08/2018  . Abnormal fetal ultrasound 11/10/2018  . Encounter for supervision of normal first pregnancy in second trimester 09/17/2018  . MIGRAINE, UNSPEC., W/O INTRACTABLE MIGRAINE 01/02/2007  . HYPERTENSION, BENIGN SYSTEMIC 01/02/2007  . RHINITIS, ALLERGIC 01/02/2007  . ASTHMA, UNSPECIFIED 01/02/2007    Overnight/24hr events:  none  Subjective:  Still + flatus, ambulation, pain controlled, taking PO well.   Objective:    Current Vital Signs 24h Vital Sign Ranges  T 98.9 F (37.2 C) Temp  Avg: 98.9 F (37.2 C)  Min: 98.3 F (36.8 C)  Max: 99.8 F (37.7 C)  BP 125/90 BP  Min: 118/70  Max: 154/93  HR 70 Pulse  Avg: 71  Min: 67  Max: 83  RR 18 Resp  Avg: 18  Min: 18  Max: 18  SaO2 99 % Room Air SpO2  Avg: 99 %  Min: 98 %  Max: 100 %       24 Hour I/O Current Shift I/O  Time Ins Outs 02/04 0701 - 02/05 0700 In: 960 [P.O.:960] Out: 1075 [Urine:1075] No intake/output data recorded.   Patient Vitals for the past 24 hrs:  BP Temp Temp src Pulse Resp SpO2  12/10/18 0805 125/90 98.9 F (37.2 C) Oral 70 - 99 %  12/10/18 0510 (!) 154/93 98.8 F (37.1 C) Oral 70 18 99 %  12/09/18 2303 118/70 98.3 F (36.8 C) Oral 83 18 100 %  12/09/18 1954 121/77 98.6 F (37 C) Oral 68 18 99 %  12/09/18 1630 130/80 99.8 F (37.7 C) Oral 68 18 98 %  12/09/18 1153 (!) 143/81 98.9 F (37.2 C) Oral 67 18 99 %    Physical exam: General: Well nourished, well developed female in no acute distress. Abdomen: +BS, soft, mildly distended, nttp, c/d/i honeycomb dressing with small area of old blood Cardiovascular: S1, S2 normal, no murmur, rub or gallop, regular rate and rhythm Respiratory: CTAB Extremities: no  clubbing, cyanosis or edema Skin: Warm and dry.   Medications: Current Facility-Administered Medications  Medication Dose Route Frequency Provider Last Rate Last Dose  . coconut oil  1 application Topical PRN Arvilla Market, DO      . witch hazel-glycerin (TUCKS) pad 1 application  1 application Topical PRN Arvilla Market, DO       And  . dibucaine (NUPERCAINAL) 1 % rectal ointment 1 application  1 application Rectal PRN Arvilla Market, DO      . diphenhydrAMINE (BENADRYL) injection 12.5 mg  12.5 mg Intravenous Q4H PRN Woodrum, Chelsey L, MD       Or  . diphenhydrAMINE (BENADRYL) capsule 25 mg  25 mg Oral Q4H PRN Woodrum, Chelsey L, MD      . diphenhydrAMINE (BENADRYL) capsule 25 mg  25 mg Oral Q6H PRN Arvilla Market, DO      . enoxaparin (LOVENOX) injection 40 mg  40 mg Subcutaneous Q24H Arvilla Market, DO   40 mg at 12/09/18 2035  . ibuprofen (ADVIL,MOTRIN) tablet 800 mg  800 mg Oral Q8H Arvilla Market, DO   800 mg at 12/10/18 0500  . menthol-cetylpyridinium (CEPACOL) lozenge 3 mg  1 lozenge Oral Q2H PRN Arvilla Market, DO      .  nalbuphine (NUBAIN) injection 5 mg  5 mg Intravenous Q4H PRN Woodrum, Chelsey L, MD       Or  . nalbuphine (NUBAIN) injection 5 mg  5 mg Subcutaneous Q4H PRN Woodrum, Chelsey L, MD      . nalbuphine (NUBAIN) injection 5 mg  5 mg Intravenous Once PRN Woodrum, Chelsey L, MD       Or  . nalbuphine (NUBAIN) injection 5 mg  5 mg Subcutaneous Once PRN Woodrum, Chelsey L, MD      . naloxone (NARCAN) injection 0.4 mg  0.4 mg Intravenous PRN Woodrum, Chelsey L, MD       And  . sodium chloride flush (NS) 0.9 % injection 3 mL  3 mL Intravenous PRN Woodrum, Chelsey L, MD      . naloxone HCl (NARCAN) 2 mg in dextrose 5 % 250 mL infusion  1-4 mcg/kg/hr Intravenous Continuous PRN Woodrum, Chelsey L, MD      . NIFEdipine (PROCARDIA-XL/NIFEDICAL-XL) 24 hr tablet 30 mg  30 mg Oral Daily [place] Bing,  MD   30 mg at 12/10/18 2841  . ondansetron (ZOFRAN) injection 4 mg  4 mg Intravenous Q8H PRN Woodrum, Chelsey L, MD      . oxyCODONE (Oxy IR/ROXICODONE) immediate release tablet 5-10 mg  5-10 mg Oral Q4H PRN Arvilla Market, DO   5 mg at 12/10/18 3244  . polyethylene glycol (MIRALAX / GLYCOLAX) packet 17 g  17 g Oral Daily [place] Bing, MD   17 g at 12/10/18 0102  . prenatal multivitamin tablet 1 tablet  1 tablet Oral Q1200 Arvilla Market, DO   1 tablet at 12/09/18 1202  . scopolamine (TRANSDERM-SCOP) 1 MG/3DAYS 1.5 mg  1 patch Transdermal Once Woodrum, Chelsey L, MD      . senna-docusate (Senokot-S) tablet 2 tablet  2 tablet Oral QHS PRN [place] Bing, MD      . simethicone (MYLICON) chewable tablet 80 mg  80 mg Oral TID PC Arvilla Market, DO   80 mg at 12/10/18 7253  . Tdap (BOOSTRIX) injection 0.5 mL  0.5 mL Intramuscular Once Arvilla Market, DO      . zolpidem (AMBIEN) tablet 5 mg  5 mg Oral QHS PRN Arvilla Market, DO        Labs:  Recent Labs  Lab 12/08/18 1741 12/09/18 0554  WBC 12.9* 15.1*  HGB 12.2 9.4*  HCT 36.1 27.6*  PLT 248 231    Recent Labs  Lab 12/09/18 0554  CREATININE 0.51     Radiology: no new imaging  Assessment & Plan:  Pt doing well *PP: routine care. Pumping. *cHTN: continue procardia xl 30 qday.  *PPx: lovenox, oob *FEN/GI: regular diet *Dispo: likely pod#3  [name]. MD Attending Center for [facility])

## 2018-12-11 NOTE — Lactation Note (Signed)
This note was copied from a baby's chart. Lactation Consultation Note  Patient Name: Boy Iceis Ebner ZSMOL'M Date: 12/11/2018 Reason for consult: Follow-up assessment;Preterm <34wks;NICU baby;1st time breastfeeding  Baby is 18 hours old  Per mom has only pumped x 1 in the last 24 hours without results/ using the #27 F  Mom showed LC her nipples / and LC noted the skin is dry. Mom recommended and  Encouraged mom to hand express/ pump/ and the hand express/ use the coconut oil for pumping  To decrease friction . ( alittle dab will do )  LC asked the RN to provided coconut oil for this mom.  Discussed supply and demand and the importance of consistent pumping around the clock.  LC reassured mom it potentially can be a slow process.  Per mom RN mom is not going home today.      Maternal Data Has patient been taught Hand Expression?: Yes  Feeding    LATCH Score                   Interventions Interventions: Breast feeding basics reviewed;DEBP  Lactation Tools Discussed/Used Tools: Pump;Coconut oil(RN will provide the coconut oil ) Breast pump type: Double-Electric Breast Pump WIC Program: Yes Pump Review: Setup, frequency, and cleaning   Consult Status Consult Status: Follow-up Date: 12/12/18 Follow-up type: In-patient    Matilde Sprang Lillieanna Tuohy 12/11/2018, 9:52 AM

## 2018-12-11 NOTE — Progress Notes (Signed)
Subjective: Postpartum Day 2: Cesarean Delivery Patient reports incisional pain, tolerating PO, + flatus and no problems voiding.    Objective: Vital signs in last 24 hours: Temp:  [98.6 F (37 C)-98.9 F (37.2 C)] 98.6 F (37 C) (02/06 0610) Pulse Rate:  [70-88] 88 (02/06 0610) Resp:  [16-18] 17 (02/06 0610) BP: (115-138)/(77-93) 138/93 (02/06 0610) SpO2:  [98 %-100 %] 100 % (02/06 0610)  Physical Exam:  General: alert, cooperative and no distress Lochia: appropriate Uterine Fundus: firm Incision: healing well, no significant drainage, no dehiscence, no significant erythema DVT Evaluation: No evidence of DVT seen on physical exam.  Recent Labs    12/08/18 1741 12/09/18 0554  HGB 12.2 9.4*  HCT 36.1 27.6*    Assessment/Plan: Status post Cesarean section. Doing well postoperatively.  Continue current care.  Dawn Hoffman 12/11/2018, 7:36 AM

## 2018-12-12 ENCOUNTER — Other Ambulatory Visit: Payer: Self-pay | Admitting: Obstetrics & Gynecology

## 2018-12-12 DIAGNOSIS — G8918 Other acute postprocedural pain: Secondary | ICD-10-CM

## 2018-12-12 MED ORDER — OXYCODONE-ACETAMINOPHEN 5-325 MG PO TABS: 1.0000 | ORAL_TABLET | Freq: Four times a day (QID) | ORAL | 0 refills | Status: DC | PRN

## 2018-12-12 MED ORDER — SIMETHICONE 80 MG PO CHEW: 80.0000 mg | CHEWABLE_TABLET | Freq: Four times a day (QID) | ORAL | 0 refills | Status: DC | PRN

## 2018-12-12 MED ORDER — POLYETHYLENE GLYCOL 3350 17 G PO PACK: 17.0000 g | PACK | Freq: Every day | ORAL | 0 refills | Status: DC | PRN

## 2018-12-12 MED ORDER — IBUPROFEN 800 MG PO TABS: 800.0000 mg | ORAL_TABLET | Freq: Three times a day (TID) | ORAL | 0 refills | Status: DC | PRN

## 2018-12-12 MED ORDER — NIFEDIPINE ER 30 MG PO TB24: 30.0000 mg | ORAL_TABLET | Freq: Every day | ORAL | 0 refills | Status: DC

## 2018-12-12 NOTE — Progress Notes (Signed)
I offered support to family throughout the day.  They feel that they are making the right decision for Dawn Hoffman and I affirmed the love that I heard in their voices as they shared their decision with me.  The family chose an outfit for him from those our volunteers donate.  They plan to return at 4:00.  Sherlean Foot [phone]) will be here to do photos at that time and Chaplain Shirlean Kelly [phone]) will be here as well.  MGM, Belenda Cruise (unsure of spelling) may want to stay at the time of withdrawal of care, but they are uncertain at this time.  FOB, Viviann Spare (unsure of spelling) will come back up with [name] to spend some time with Dawn Hoffman and have photos done.  The family plans to meet with Ferdinand Lango tomorrow at 3:30 for cremation services.  Chaplain Dyanne Carrel, Bcc Pager, [phone] 3:05 PM    12/12/18 1500  Clinical Encounter Type  Visited With Patient and family together  Visit Type Spiritual support  Referral From Nurse  Spiritual Encounters  Spiritual Needs Emotional;Grief support

## 2018-12-12 NOTE — Progress Notes (Signed)
Discharge teaching complete with pt. Pt understood all information and did not have any questions. 

## 2018-12-12 NOTE — Discharge Summary (Signed)
Obstetrical Discharge Summary  Date of Admission: 12/08/2018 Date of Discharge: 12/12/2018  Primary OB: [facility]  Gestational Age at Delivery: [redacted]w[redacted]d   Antepartum complications: cHTN, FGR with abnormal dopplers Reason for Admission: Nonreassuring antenatal testing (BPP 0/10) Date of Delivery: 12/08/2018  Delivered By: Nettie Elm, MD Delivery Type: primary cesarean section, low transverse incision Intrapartum complications/course: N/A Anesthesia: spinal Placenta: Delivered and expressed via active management. Intact: yes. To pathology: yes.  Laceration: n/a Episiotomy: n/a EBL: Baby: Liveborn female, APGARs 1/6, weight 570 g.    Discharge Diagnosis: Delivered. Same  Postpartum course: uncomplicated. Patient never had severe pre-eclampsia. She was transitioned from procardia 60 bid and labetalol to just procardia 30 qday.  She was meeting all postop goals on the day of discharge, including flatus.  Discharge Vital Signs:  Current Vital Signs 24h Vital Sign Ranges  T 98.4 F (36.9 C) Temp  Avg: 98.2 F (36.8 C)  Min: 98 F (36.7 C)  Max: 98.4 F (36.9 C)  BP (!) 157/97 BP  Min: 133/88  Max: 157/97  HR 89 Pulse  Avg: 86.7  Min: 85  Max: 89  RR 16 Resp  Avg: 16.7  Min: 16  Max: 18  SaO2 99 % Room Air SpO2  Avg: 99 %  Min: 98 %  Max: 100 %       24 Hour I/O Current Shift I/O  Time Ins Outs No intake/output data recorded. No intake/output data recorded.    Discharge Exam:  NAD Perineum: deferred Abdomen: firm fundus below the umbilicus, NTTP, non distended, +bowel sounds.  Incision c/d/i with dressing in place RRR no MRGs CTAB  Recent Labs  Lab 12/08/18 1741 12/09/18 0554  WBC 12.9* 15.1*  HGB 12.2 9.4*  HCT 36.1 27.6*  PLT 248 231    Disposition: Home  Rh Immune globulin given: not applicable Rubella vaccine given: not applicable  Contraception: not discussed  Prenatal/Postnatal Panel: B POS//Rubella Immune//Varicella Unknown//RPR negative//HIV  negative/HepB Surface Ag negative//pap no abnormalities (date: 2019)//plans to breastfeed  Plan:  Dawn Hoffman was discharged to home in good condition. Follow-up appointment with [facility] in 4 days for a BP and incision visit  12/16/2018 @ 0830   Discharge Medications: Allergies as of 12/12/2018   No Known Allergies     Medication List    STOP taking these medications   labetalol 100 MG tablet Commonly known as:  NORMODYNE     TAKE these medications   COMFORT FIT MATERNITY SUPP SM Misc Wear as directed.   fexofenadine 180 MG tablet Commonly known as:  ALLEGRA Take 1 tablet (180 mg total) by mouth daily.   ibuprofen 800 MG tablet Commonly known as:  ADVIL,MOTRIN Take 1 tablet (800 mg total) by mouth every 8 (eight) hours as needed.   loratadine 10 MG tablet Commonly known as:  CLARITIN Take 1 tablet (10 mg total) by mouth daily.   NIFEdipine 30 MG 24 hr tablet Commonly known as:  ADALAT CC Take 1 tablet (30 mg total) by mouth daily. Start taking on:  December 13, 2018 What changed:    medication strength  how much to take  when to take this   oxyCODONE-acetaminophen 5-325 MG tablet Commonly known as:  PERCOCET/ROXICET Take 1-2 tablets by mouth every 6 (six) hours as needed.   polyethylene glycol packet Commonly known as:  MIRALAX / GLYCOLAX Take 17 g by mouth daily as needed.   prenatal vitamin w/FE, FA 27-1 MG Tabs tablet Take 1 tablet by mouth  daily before breakfast.   simethicone 80 MG chewable tablet Commonly known as:  MYLICON Chew 1 tablet (80 mg total) by mouth 4 (four) times daily as needed for flatulence.       Dawn Hoffman, Jr. MD Attending Center for [facility](Faculty Practice)

## 2018-12-12 NOTE — Discharge Instructions (Signed)
°Cesarean Delivery, Care After °Refer to this sheet in the next few weeks. These instructions provide you with information on caring for yourself after your procedure. Your health care provider may also give you specific instructions. Your treatment has been planned according to current medical practices, but problems sometimes occur. Call your health care provider if you have any problems or questions after you go home. °HOME CARE INSTRUCTIONS  °· Only take over-the-counter or prescription medications as directed by your health care provider. °· Do not drink alcohol, especially if you are breastfeeding or taking medication to relieve pain. °· Do not  smoke tobacco. °· Continue to use good perineal care. Good perineal care includes: °¨ Wiping your perineum from front to back. °¨ Keeping your perineum clean. °· Check your surgical cut (incision) daily for increased redness, drainage, swelling, or separation of skin. °· Shower and clean your incision gently with soap and water every day, by letting warm and soapy water run over the incision, and then pat it dry. If your health care provider says it is okay, leave the incision uncovered. Use a bandage (dressing) if the incision is draining fluid or appears irritated. If the adhesive strips across the incision do not fall off within 7 days, carefully peel them off, after a shower. °· Hug a pillow when coughing or sneezing until your incision is healed. This helps to relieve pain. °· Do not use tampons, douches or have sexual intercourse, until your health care provider says it is okay. °· Wear a well-fitting bra that provides breast support. °· Limit wearing support panties or control-top hose. °· Drink enough fluids to keep your urine clear or pale yellow. °· Eat high-fiber foods such as whole grain cereals and breads, brown rice, beans, and fresh fruits and vegetables every day. These foods may help prevent or relieve constipation. °· Resume activities such as  climbing stairs, driving, lifting, exercising, or traveling as directed by your health care provider. °· Try to have someone help you with your household activities and your newborn for at least a few days after you leave the hospital. °· Rest as much as possible. Try to rest or take a nap when your newborn is sleeping. °· Increase your activities gradually. °· Do not lift more than 15lbs until directed by a provider. °· Keep all of your scheduled postpartum appointments. It is very important to keep your scheduled follow-up appointments. At these appointments, your health care provider will be checking to make sure that you are healing physically and emotionally. °SEEK MEDICAL CARE IF:  °· You are passing large clots from your vagina. Save any clots to show your health care provider. °· You have a foul smelling discharge from your vagina. °· You have trouble urinating. °· You are urinating frequently. °· You have pain when you urinate. °· You have a change in your bowel movements. °· You have increasing redness, pain, or swelling near your incision. °· You have pus draining from your incision. °· Your incision is separating. °· You have painful, hard, or reddened breasts. °· You have a severe headache. °· You have blurred vision or see spots. °· You feel sad or depressed. °· You have thoughts of hurting yourself or your newborn. °· You have questions about your care, the care of your newborn, or medications. °· You are dizzy or light-headed. °· You have a rash. °· You have pain, redness, or swelling at the site of the removed intravenous access (IV) tube. °· You have nausea   or vomiting.  You stopped breastfeeding and have not had a menstrual period within 12 weeks of stopping.  You are not breastfeeding and have not had a menstrual period within 12 weeks of delivery.  You have a fever. SEEK IMMEDIATE MEDICAL CARE IF:  You have persistent pain.  You have chest pain.  You have shortness of breath.  You  faint.  You have leg pain.  You have stomach pain.  Your vaginal bleeding saturates 2 or more sanitary pads in 1 hour. MAKE SURE YOU:   Understand these instructions.  Will watch your condition.  Will get help right away if you are not doing well or get worse. Document Released: 07/14/2002 Document Revised: 03/08/2014 Document Reviewed: 06/18/2012 [facility] Patient Information 2015 [place], [place]. This information is not intended to replace advice given to you by your health care provider. Make sure you discuss any questions you have with your health care provider.   Hypertension During Pregnancy  General instructions  Take over-the-counter and prescription medicines only as told by your doctor.  While lying down, lie on your left side. This keeps pressure off your major blood vessels.  While sitting or lying down, raise (elevate) your feet. Try putting some pillows under your lower legs.  Exercise regularly. Ask your doctor what kinds of exercise are best for you.  Keep all prenatal and follow-up visits as told by your doctor. This is important. Contact a doctor if:  You have symptoms that your doctor told you to watch for, such as: ? Throwing up (vomiting). ? Feeling sick to your stomach (nausea). ? Headache. Get help right away if you have:  Very bad belly pain that does not get better with treatment.  A very bad headache that does not get better.  Throwing up that does not get better with treatment.  Sudden, fast weight gain.  Sudden swelling in your hands, ankles, or face  Fewer movements from your baby than usual.  Blurry vision.  Double vision.  Muscle twitching.  Sudden muscle tightening (spasms).  Trouble breathing.  Blue fingernails or lips. Summary  Hypertension is also called high blood pressure. High blood pressure means that the force of your blood moving in your body is too strong.  When you are pregnant, this condition should be  watched carefully. It can cause problems for you and your baby.  Get help right away if you have symptoms that your doctor told you to watch for. This information is not intended to replace advice given to you by your health care provider. Make sure you discuss any questions you have with your health care provider. Document Released: 11/24/2010 Document Revised: 10/08/2017 Document Reviewed: 07/03/2016 Elsevier Interactive Patient Education  2019 [employer].

## 2018-12-12 NOTE — Lactation Note (Signed)
This note was copied from a baby's chart. Lactation Consultation Note  Patient Name: Dawn Hoffman RJJOA'C Date: 12/12/2018 Reason for consult: Follow-up assessment;Preterm <34wks;1st time breastfeeding(mom has her DEBP [facility] loaner from [facility] )  Visited mom in her room today resting in bed.  Per mom has pumped x 2 since yesterday.  LC aware of the infants medical status via the NP's progress note.  Engorgement prevention tx reviewed .  Mom aware when she goes home if needing LC services she can call or when in NICU can have  NICU RN call for [facility].    Maternal Data    Feeding    LATCH Score                   Interventions    Lactation Tools Discussed/Used     Consult Status Consult Status: PRN Follow-up type: Other (comment)(baby is in NICU )    Matilde Sprang [facility] 12/12/2018, 10:32 AM

## 2018-12-16 ENCOUNTER — Ambulatory Visit (INDEPENDENT_AMBULATORY_CARE_PROVIDER_SITE_OTHER): Payer: Medicaid Other | Admitting: Obstetrics

## 2018-12-16 ENCOUNTER — Encounter: Payer: Self-pay | Admitting: Obstetrics

## 2018-12-16 DIAGNOSIS — Z1389 Encounter for screening for other disorder: Secondary | ICD-10-CM | POA: Diagnosis not present

## 2018-12-16 DIAGNOSIS — O9089 Other complications of the puerperium, not elsewhere classified: Secondary | ICD-10-CM

## 2018-12-16 DIAGNOSIS — F32A Depression, unspecified: Secondary | ICD-10-CM

## 2018-12-16 DIAGNOSIS — R52 Pain, unspecified: Secondary | ICD-10-CM

## 2018-12-16 DIAGNOSIS — F329 Major depressive disorder, single episode, unspecified: Secondary | ICD-10-CM

## 2018-12-16 DIAGNOSIS — O165 Unspecified maternal hypertension, complicating the puerperium: Secondary | ICD-10-CM

## 2018-12-16 DIAGNOSIS — F419 Anxiety disorder, unspecified: Secondary | ICD-10-CM

## 2018-12-16 MED ORDER — ALPRAZOLAM 0.25 MG PO TABS: 0.2500 mg | ORAL_TABLET | Freq: Two times a day (BID) | ORAL | 2 refills | Status: DC

## 2018-12-16 MED ORDER — ALPRAZOLAM 0.25 MG PO TABS: 0.2500 mg | ORAL_TABLET | Freq: Every evening | ORAL | 2 refills | Status: DC | PRN

## 2018-12-16 MED ORDER — IBUPROFEN 800 MG PO TABS: 800.0000 mg | ORAL_TABLET | Freq: Three times a day (TID) | ORAL | 5 refills | Status: DC | PRN

## 2018-12-16 MED ORDER — NIFEDIPINE ER 30 MG PO TB24: 30.0000 mg | ORAL_TABLET | Freq: Every day | ORAL | 2 refills | Status: DC

## 2018-12-16 MED ORDER — LABETALOL HCL 200 MG PO TABS: 200.0000 mg | ORAL_TABLET | Freq: Two times a day (BID) | ORAL | 2 refills | Status: DC

## 2018-12-16 NOTE — Progress Notes (Signed)
Pt B/P is elevated Denies any headaches , no visual changes, no swelling.

## 2018-12-16 NOTE — Progress Notes (Addendum)
Subjective:     Dawn Hoffman is a 28 y.o. female who presents for a postpartum visit. She is 1 week postpartum following a low cervical transverse Cesarean section. I have fully reviewed the prenatal and intrapartum course. The delivery was at 29.6 gestational weeks. Outcome: primary cesarean section, low transverse incision. Anesthesia: spinal. 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  Bleeding thin lochia. Bowel function is normal. Bladder function is normal. Patient is not sexually active. Contraception method is abstinence. Postpartum depression screening: positive.  Tobacco, alcohol and substance abuse history reviewed.  Adult immunizations reviewed including TDAP, rubella and varicella.  The following portions of the patient's history were reviewed and updated as appropriate: allergies, current medications, past family history, past medical history, past social history, past surgical history and problem list.  Review of Systems A comprehensive review of systems was negative except for: Behavioral/Psych: positive for depression   Objective:    BP (!) 153/109   Pulse 75   Wt 180 lb (81.6 kg)   LMP 05/13/2018 (Exact Date)   BMI 35.15 kg/m   General:  alert and no distress   Breasts:  inspection negative, no nipple discharge or bleeding, no masses or nodularity palpable  Lungs: clear to auscultation bilaterally  Heart:  regular rate and rhythm, S1, S2 normal, no murmur, click, rub or gallop  Abdomen: normal findings: bowel sounds normal, no masses palpable, no organomegaly, soft, non-tender and incision is clean, dry and intact    50% of 15 min visit spent on counseling and coordination of care.   Assessment:     1. Postpartum care following cesarean delivery - doing well postoperatively  2. Postpartum hypertension Rx:Rx: - NIFEdipine (ADALAT CC) 30 MG 24 hr tablet; Take 1 tablet (30  mg total) by mouth daily.  Dispense: 60 tablet; Refill: 2 - labetalol (NORMODYNE) 200 MG tablet; Take 1 tablet (200 mg total) by mouth 2 (two) times daily.  Dispense: 60 tablet; Refill: 2  3. Postpartum pain Rx: - ibuprofen (ADVIL,MOTRIN) 800 MG tablet; Take 1 tablet (800 mg total) by mouth every 8 (eight) hours as needed.  Dispense: 30 tablet; Refill: 5  4. Anxiety and depression Rx: - Amb ref to [facility] - ALPRAZolam (XANAX) 0.25 MG tablet; Take 1 tablet (0.25 mg total) by mouth 2 (two) times daily.  Dispense: 60 tablet; Refill: 2   Plan:    1. Contraception: abstinence 2. Continue PNV's 3. Follow up in: 2 weeks or as needed.   Brock BadHARLES A. Tonnie Stillman MD 12-16-2018

## 2018-12-18 ENCOUNTER — Telehealth: Payer: Self-pay | Admitting: Obstetrics

## 2018-12-18 NOTE — Telephone Encounter (Signed)
Patient called because of palpitations and decreased blood pressure after starting Labetalol.  Recommended stopping Labetalol and continuing Procardia 30XL bid.

## 2018-12-18 NOTE — Addendum Note (Signed)
Addended by: Brock Bad on: 12/18/2018 04:44 PM   Modules accepted: Orders

## 2018-12-30 ENCOUNTER — Ambulatory Visit (INDEPENDENT_AMBULATORY_CARE_PROVIDER_SITE_OTHER): Payer: Medicaid Other | Admitting: Obstetrics

## 2018-12-30 ENCOUNTER — Encounter: Payer: Self-pay | Admitting: Obstetrics

## 2018-12-30 DIAGNOSIS — O165 Unspecified maternal hypertension, complicating the puerperium: Secondary | ICD-10-CM

## 2018-12-30 DIAGNOSIS — F53 Postpartum depression: Secondary | ICD-10-CM

## 2018-12-30 DIAGNOSIS — O99345 Other mental disorders complicating the puerperium: Secondary | ICD-10-CM

## 2018-12-30 NOTE — Progress Notes (Signed)
..  Post Partum Exam  Dawn Hoffman is a 28 y.o. [phone] female who presents for a postpartum visit. She is 3 weeks postpartum following a low cervical transverse Cesarean section. I have fully reviewed the prenatal and intrapartum course. The delivery was at 29.6 gestational weeks.  Anesthesia: spinal. Postpartum course has been complicated by anxiety / depression. Bleeding is thin lochia. Bowel function is normal. Bladder function is normal. Patient is not sexually active. Contraception method is none. Postpartum depression screening:neg  The following portions of the patient's history were reviewed and updated as appropriate: allergies, current medications, past family history, past medical history, past social history, past surgical history and problem list. Last pap smear done in 2014 and was Normal  Review of Systems A comprehensive review of systems was negative.    Objective:    PE:            General:  Alert and no distress          Lungs: clear          Heart:  RRR           Abdomen:  Soft, non tender.  No organomegaly, masses.  Incision clean, dry and intact   Assessment:    1. Cesarean delivery delivered  2. Early neonatal death  3. Postpartum depression - stable  4. Postpartum hypertension - stable, on meds  Plan:   1. Contraception: abstinence 2. Continue PNV's 3. Follow up in: 4 weeks or as needed.    Brock Bad MD 01/02/2019

## 2019-01-04 DIAGNOSIS — 419620001 Death: Secondary | SNOMED CT | POA: Diagnosis not present

## 2019-01-04 DEATH — deceased

## 2019-01-23 ENCOUNTER — Encounter: Payer: Self-pay | Admitting: Obstetrics

## 2019-01-23 ENCOUNTER — Ambulatory Visit (INDEPENDENT_AMBULATORY_CARE_PROVIDER_SITE_OTHER): Payer: Medicaid Other | Admitting: Obstetrics

## 2019-01-23 ENCOUNTER — Other Ambulatory Visit: Payer: Self-pay

## 2019-01-23 DIAGNOSIS — O165 Unspecified maternal hypertension, complicating the puerperium: Secondary | ICD-10-CM

## 2019-01-23 DIAGNOSIS — Z1389 Encounter for screening for other disorder: Secondary | ICD-10-CM

## 2019-01-23 DIAGNOSIS — F53 Postpartum depression: Secondary | ICD-10-CM

## 2019-01-23 DIAGNOSIS — O99345 Other mental disorders complicating the puerperium: Secondary | ICD-10-CM

## 2019-01-23 MED ORDER — NIFEDIPINE ER 30 MG PO TB24: 30.0000 mg | ORAL_TABLET | Freq: Two times a day (BID) | ORAL | 2 refills | Status: DC

## 2019-01-23 NOTE — Progress Notes (Signed)
Subjective:     Dawn Hoffman is a 28 y.o. female who presents for a postpartum visit. She is 6 weeks postpartum following a low cervical transverse Cesarean section. I have fully reviewed the prenatal and intrapartum course. The delivery was at 29 gestational weeks. Outcome: primary cesarean section, low transverse incision. Anesthesia: spinal. Postpartum course has been complicated by PPD#5 Neonatal Demise followed by postpartum depression and hypertension.  Bleeding thin lochia. Bowel function is normal. Bladder function is normal. Patient is sexually active. Contraception method is none. Postpartum depression screening: positive.  Tobacco, alcohol and substance abuse history reviewed.  Adult immunizations reviewed including TDAP, rubella and varicella.  The following portions of the patient's history were reviewed and updated as appropriate: allergies, current medications, past family history, past medical history, past social history, past surgical history and problem list.  Review of Systems A comprehensive review of systems was negative except for: Behavioral/Psych: positive for depression   Objective:    BP (!) 140/96   Pulse 75   Wt 180 lb (81.6 kg)   LMP 01/09/2019 (Exact Date)   BMI 35.15 kg/m   General:  alert and no distress   Breasts:  inspection negative, no nipple discharge or bleeding, no masses or nodularity palpable  Lungs: clear to auscultation bilaterally  Heart:  regular rate and rhythm, S1, S2 normal, no murmur, click, rub or gallop  Abdomen: soft, non-tender; bowel sounds normal; no masses,  no organomegaly and incision is clean, dry and intact.  Non tender.    >50% of 15 min visit spent on counseling and coordination of care.   Assessment:     1. Postpartum care following cesarean delivery - doing well  2. Early neonatal death  3. Postpartum depression - improved with counseling  4. Postpartum hypertension - unstable.  Procardia 30 XL increased 30  mg po bid  Plan:    1. Contraception: Nexplanon 2. Continue PNV's 3. Follow up in: 2 weeks or as needed.   Brock Bad MD 01/22/2019

## 2019-01-23 NOTE — Progress Notes (Signed)
Pt denies any HA's, no visual changes no swelling.  Pt needs return to work note

## 2019-02-04 ENCOUNTER — Encounter: Payer: Self-pay | Admitting: Obstetrics

## 2019-02-04 DIAGNOSIS — 419620001 Death: Secondary | SNOMED CT

## 2019-02-04 DEATH — deceased

## 2019-02-06 ENCOUNTER — Ambulatory Visit (INDEPENDENT_AMBULATORY_CARE_PROVIDER_SITE_OTHER): Payer: Medicaid Other | Admitting: Obstetrics

## 2019-02-06 ENCOUNTER — Other Ambulatory Visit: Payer: Self-pay

## 2019-02-06 ENCOUNTER — Encounter: Payer: Self-pay | Admitting: Obstetrics

## 2019-02-06 DIAGNOSIS — F53 Postpartum depression: Secondary | ICD-10-CM

## 2019-02-06 DIAGNOSIS — O99345 Other mental disorders complicating the puerperium: Secondary | ICD-10-CM

## 2019-02-06 DIAGNOSIS — O165 Unspecified maternal hypertension, complicating the puerperium: Secondary | ICD-10-CM

## 2019-02-06 NOTE — Progress Notes (Signed)
Subjective:     Dawn Hoffman is a 28 y.o. female who presents for a postpartum visit. She is 8 weeks postpartum following a low cervical transverse Cesarean section. I have fully reviewed the prenatal and intrapartum course. The delivery was at 29 gestational weeks. Outcome: primary cesarean section, low transverse incision. Anesthesia: spinal. Postpartum course has been complicated by a neonatal demise on PPD#5, and subsequent postpartum depression.  Bleeding no bleeding. Bowel function is normal. Bladder function is normal. Patient is not sexually active. Contraception method is abstinence. Postpartum depression screening: positive.  Tobacco, alcohol and substance abuse history reviewed.  Adult immunizations reviewed including TDAP, rubella and varicella.  The following portions of the patient's history were reviewed and updated as appropriate: allergies, current medications, past family history, past medical history, past social history, past surgical history and problem list.  Review of Systems A comprehensive review of systems was negative except for: Behavioral/Psych: positive for depression   Objective:    BP (!) 143/95   Pulse 76   Wt 179 lb 12.8 oz (81.6 kg)   LMP 01/09/2019 (Exact Date)   BMI 35.11 kg/m    PE:  Deferred    >50% of 15 min visit spent on counseling and coordination of care.    Assessment:     1. Postpartum care following cesarean delivery  doing well  2. Early neonatal death  3. Postpartum depression - improved  4. Postpartum hypertension - borderline.  Continue Procardia   Plan:    1. Contraception: abstinence 2. Continue PNV's 3. Follow up in: 4 weeks or as needed.    Brock Bad MD 02/24/2019

## 2019-02-06 NOTE — Progress Notes (Signed)
Pt presents to follow up BP and mood check . Pt is doing very well.

## 2019-03-06 DIAGNOSIS — 419620001 Death: Secondary | SNOMED CT

## 2019-03-06 DEATH — deceased

## 2019-05-25 ENCOUNTER — Telehealth: Payer: Self-pay

## 2019-05-25 NOTE — Telephone Encounter (Signed)
left VM message to send [place] the Forms that needs to be completed

## 2019-06-10 ENCOUNTER — Other Ambulatory Visit: Payer: Self-pay

## 2019-06-10 DIAGNOSIS — Z20822 Contact with and (suspected) exposure to covid-19: Secondary | ICD-10-CM

## 2019-06-11 LAB — NOVEL CORONAVIRUS, NAA: SARS-CoV-2, NAA: NOT DETECTED

## 2019-08-13 ENCOUNTER — Other Ambulatory Visit: Payer: Self-pay

## 2019-08-13 DIAGNOSIS — Z20822 Contact with and (suspected) exposure to covid-19: Secondary | ICD-10-CM

## 2019-08-15 LAB — NOVEL CORONAVIRUS, NAA: SARS-CoV-2, NAA: NOT DETECTED

## 2019-10-09 ENCOUNTER — Other Ambulatory Visit: Payer: Self-pay

## 2019-10-09 DIAGNOSIS — Z20822 Contact with and (suspected) exposure to covid-19: Secondary | ICD-10-CM

## 2019-10-14 LAB — NOVEL CORONAVIRUS, NAA

## 2019-11-20 ENCOUNTER — Other Ambulatory Visit: Payer: Self-pay

## 2019-11-20 ENCOUNTER — Encounter: Payer: Self-pay | Admitting: Obstetrics

## 2019-11-20 ENCOUNTER — Ambulatory Visit: Payer: Medicaid Other | Admitting: Obstetrics

## 2019-11-20 ENCOUNTER — Other Ambulatory Visit (HOSPITAL_COMMUNITY)
Admission: RE | Admit: 2019-11-20 | Discharge: 2019-11-20 | Disposition: A | Discharge status: Home / Self Care | Payer: Medicaid Other | Source: Ambulatory Visit | Attending: Obstetrics | Admitting: Obstetrics

## 2019-11-20 VITALS — BP 146/95 | HR 72 | Ht 60.0 in | Wt 178.0 lb

## 2019-11-20 DIAGNOSIS — F32A Depression, unspecified: Secondary | ICD-10-CM

## 2019-11-20 DIAGNOSIS — Z3202 Encounter for pregnancy test, result negative: Secondary | ICD-10-CM

## 2019-11-20 DIAGNOSIS — Z3009 Encounter for other general counseling and advice on contraception: Secondary | ICD-10-CM

## 2019-11-20 DIAGNOSIS — Z30011 Encounter for initial prescription of contraceptive pills: Secondary | ICD-10-CM

## 2019-11-20 DIAGNOSIS — Z01419 Encounter for gynecological examination (general) (routine) without abnormal findings: Secondary | ICD-10-CM

## 2019-11-20 DIAGNOSIS — N898 Other specified noninflammatory disorders of vagina: Secondary | ICD-10-CM | POA: Insufficient documentation

## 2019-11-20 DIAGNOSIS — N926 Irregular menstruation, unspecified: Secondary | ICD-10-CM

## 2019-11-20 DIAGNOSIS — Z0000 Encounter for general adult medical examination without abnormal findings: Secondary | ICD-10-CM | POA: Diagnosis not present

## 2019-11-20 DIAGNOSIS — F419 Anxiety disorder, unspecified: Secondary | ICD-10-CM

## 2019-11-20 DIAGNOSIS — F329 Major depressive disorder, single episode, unspecified: Secondary | ICD-10-CM

## 2019-11-20 LAB — POCT URINE PREGNANCY: Preg Test, Ur: NEGATIVE

## 2019-11-20 MED ORDER — SLYND 4 MG PO TABS: 1.0000 | ORAL_TABLET | Freq: Every day | ORAL | 11 refills | Status: DC

## 2019-11-20 NOTE — Progress Notes (Signed)
Patient is in the office for annual, last pap 07-23-18 at [facility]. Pt states she did not take BP med today. LMP 09-09-19, pt states she has taken UPTs at home and they were negative, currently sexually active.

## 2019-11-21 NOTE — Progress Notes (Signed)
Subjective:        Dawn Hoffman is a 29 y.o. female here for a routine exam.  Current complaints: None.    Personal health questionnaire:  Is patient Ashkenazi Jewish, have a family history of breast and/or ovarian cancer: no Is there a family history of uterine cancer diagnosed at age < 52, gastrointestinal cancer, urinary tract cancer, family member who is a Personnel officer syndrome-associated carrier: no Is the patient overweight and hypertensive, family history of diabetes, personal history of gestational diabetes, preeclampsia or PCOS: yes Is patient over 62, have PCOS,  family history of premature CHD under age 82, diabetes, smoke, have hypertension or peripheral artery disease:  no At any time, has a partner hit, kicked or otherwise hurt or frightened you?: no Over the past 2 weeks, have you felt down, depressed or hopeless?: no Over the past 2 weeks, have you felt little interest or pleasure in doing things?:no   Gynecologic History Patient's last menstrual period was 09/19/2019. Contraception: none Last Pap: 07-23-2018. Results were: normal Last mammogram: n/a. Results were: n/a  Obstetric History OB History  Gravida Para Term Preterm AB Living  2 1   1 1 1   SAB TAB Ectopic Multiple Live Births  1     0 1    # Outcome Date GA Lbr Len/2nd Weight Sex Delivery Anes PTL Lv  2 Preterm 12/08/18 [redacted]w[redacted]d  1 lb 4.1 oz (0.57 kg) M CS-LTranv Spinal  LIV  1 SAB             Past Medical History:  Diagnosis Date  . Anemia   . Anxiety   . Asthma   . Dyspnea   . Headache   . Hypertension     Past Surgical History:  Procedure Laterality Date  . CESAREAN SECTION N/A 12/08/2018   Procedure: CESAREAN SECTION;  Surgeon: 02/22/2019, MD;  Location: [facility] BIRTHING SUITES;  Service: Obstetrics;  Laterality: N/A;     Current Outpatient Medications:  .  hydrochlorothiazide (HYDRODIURIL) 12.5 MG tablet, Take 12.5 mg by mouth daily., Disp: , Rfl:  .  ALPRAZolam (XANAX) 0.25 MG tablet,  Take 1 tablet (0.25 mg total) by mouth 2 (two) times daily. (Patient not taking: Reported on 16-Feb-2019), Disp: 60 tablet, Rfl: 2 .  Drospirenone (SLYND) 4 MG TABS, Take 1 tablet by mouth daily., Disp: 28 tablet, Rfl: 11 .  Elastic Bandages & Supports (COMFORT FIT MATERNITY SUPP SM) MISC, Wear as directed. (Patient not taking: Reported on 12/16/2018), Disp: 1 each, Rfl: 0 .  fexofenadine (ALLEGRA) 180 MG tablet, Take 1 tablet (180 mg total) by mouth daily. (Patient not taking: Reported on 11/20/2019), Disp: 30 tablet, Rfl: 11 .  ibuprofen (ADVIL,MOTRIN) 800 MG tablet, Take 1 tablet (800 mg total) by mouth every 8 (eight) hours as needed. (Patient not taking: Reported on 11/20/2019), Disp: 30 tablet, Rfl: 5 .  loratadine (CLARITIN) 10 MG tablet, Take 1 tablet (10 mg total) by mouth daily. (Patient not taking: Reported on 11/20/2019), Disp: 30 tablet, Rfl: 11 .  NIFEdipine (ADALAT CC) 30 MG 24 hr tablet, Take 1 tablet (30 mg total) by mouth 2 (two) times daily. (Patient not taking: Reported on 11/20/2019), Disp: 60 tablet, Rfl: 2 .  oxyCODONE-acetaminophen (PERCOCET/ROXICET) 5-325 MG tablet, Take 1 tablet by mouth every 6 (six) hours as needed for severe pain. (Patient not taking: Reported on 12/25/2018), Disp: 28 tablet, Rfl: 0 .  polyethylene glycol (MIRALAX / GLYCOLAX) packet, Take 17 g by mouth daily  as needed. (Patient not taking: Reported on 01/26/2019), Disp: 30 each, Rfl: 0 .  prenatal vitamin w/FE, FA (PRENATAL 1 + 1) 27-1 MG TABS tablet, Take 1 tablet by mouth daily before breakfast. (Patient not taking: Reported on 11/20/2019), Disp: 90 each, Rfl: 4 .  simethicone (MYLICON) 80 MG chewable tablet, Chew 1 tablet (80 mg total) by mouth 4 (four) times daily as needed for flatulence. (Patient not taking: Reported on Jan 19, 2019), Disp: 30 tablet, Rfl: 0 No Known Allergies  Social History   Tobacco Use  . Smoking status: Never Smoker  . Smokeless tobacco: Never Used  Substance Use Topics  . Alcohol use: No     Family History  Problem Relation Age of Onset  . Alcohol abuse Father   . Drug abuse Father   . Cancer Maternal Grandmother   . Diabetes Maternal Grandfather       Review of Systems  Constitutional: negative for fatigue and weight loss Respiratory: negative for cough and wheezing Cardiovascular: negative for chest pain, fatigue and palpitations Gastrointestinal: negative for abdominal pain and change in bowel habits Musculoskeletal:negative for myalgias Neurological: negative for gait problems and tremors Behavioral/Psych: negative for abusive relationship, depression Endocrine: negative for temperature intolerance    Genitourinary:negative for abnormal menstrual periods, genital lesions, hot flashes, sexual problems and vaginal discharge Integument/breast: negative for breast lump, breast tenderness, nipple discharge and skin lesion(s)    Objective:       BP (!) 146/95   Pulse 72   Ht 5' (1.524 m)   Wt 178 lb (80.7 kg)   LMP 09/19/2019   BMI 34.76 kg/m  General:   alert  Skin:   no rash or abnormalities  Lungs:   clear to auscultation bilaterally  Heart:   regular rate and rhythm, S1, S2 normal, no murmur, click, rub or gallop  Breasts:   normal without suspicious masses, skin or nipple changes or axillary nodes  Abdomen:  normal findings: no organomegaly, soft, non-tender and no hernia  Pelvis:  External genitalia: normal general appearance Urinary system: urethral meatus normal and bladder without fullness, nontender Vaginal: normal without tenderness, induration or masses Cervix: normal appearance Adnexa: normal bimanual exam Uterus: anteverted and non-tender, normal size   Lab Review Urine pregnancy test Labs reviewed yes Radiologic studies reviewed no  50% of 25 min visit spent on counseling and coordination of care.   Assessment:     1. Encounter for routine gynecological examination with Papanicolaou smear of cervix Rx: - Cytology - PAP( [facility])  2. Irregular menstrual bleeding Rx: - POCT urine pregnancy  3. Vaginal discharge Rx: - Cervicovaginal ancillary only( [facility])  4. Encounter for other general counseling and advice on contraception - wants OCP's  5. Encounter for initial prescription of contraceptive pills Rx: - Drospirenone (SLYND) 4 MG TABS; Take 1 tablet by mouth daily.  Dispense: 28 tablet; Refill: 11  6. Anxiety and depression - clinically stable    Plan:    Education reviewed: calcium supplements, depression evaluation, low fat, low cholesterol diet, safe sex/STD prevention, self breast exams and weight bearing exercise. Contraception: oral progesterone-only contraceptive. Follow up in: 1 year.   Meds ordered this encounter  Medications  . Drospirenone (SLYND) 4 MG TABS    Sig: Take 1 tablet by mouth daily.    Dispense:  28 tablet    Refill:  11   Orders Placed This Encounter  Procedures  . POCT urine pregnancy    Brock Bad, MD 11/21/2019  6:26 AM

## 2019-11-23 LAB — CYTOLOGY - PAP
Comment: NEGATIVE
Diagnosis: NEGATIVE
High risk HPV: NEGATIVE

## 2019-11-23 LAB — CERVICOVAGINAL ANCILLARY ONLY
Bacterial Vaginitis (gardnerella): NEGATIVE
Candida Glabrata: NEGATIVE
Candida Vaginitis: NEGATIVE
Chlamydia: NEGATIVE
Comment: NEGATIVE
Comment: NEGATIVE
Comment: NEGATIVE
Comment: NEGATIVE
Comment: NEGATIVE
Comment: NORMAL
Neisseria Gonorrhea: NEGATIVE
Trichomonas: NEGATIVE

## 2020-01-04 ENCOUNTER — Ambulatory Visit: Payer: Medicaid Other | Attending: Internal Medicine

## 2020-01-04 DIAGNOSIS — Z23 Encounter for immunization: Secondary | ICD-10-CM | POA: Insufficient documentation

## 2020-01-04 NOTE — Progress Notes (Signed)
   Covid-19 Vaccination Clinic  Name:  KAROLYNA BIANCHINI    MRN: [id number] DOB: [birth year]  01/04/2020  Ms. Dubs was observed post Covid-19 immunization for 15 minutes without incidence. She was provided with Vaccine Information Sheet and instruction to access the V-Safe system.   Ms. Iglesia was instructed to call 911 with any severe reactions post vaccine: Marland Kitchen Difficulty breathing  . Swelling of your face and throat  . A fast heartbeat  . A bad rash all over your body  . Dizziness and weakness    Immunizations Administered    Name Date Dose VIS Date Route   Pfizer COVID-19 Vaccine 01/04/2020 11:07 AM 0.3 mL 10/16/2019 Intramuscular   Manufacturer: [employer], [employer]   Lot: [id number]   NDC: [address]-7

## 2020-01-27 ENCOUNTER — Ambulatory Visit: Payer: Medicaid Other | Attending: Internal Medicine

## 2020-01-27 DIAGNOSIS — Z23 Encounter for immunization: Secondary | ICD-10-CM

## 2020-01-27 NOTE — Progress Notes (Signed)
   Covid-19 Vaccination Clinic  Name:  Dawn Hoffman    MRN: [id number] DOB: [birth year]  01/27/2020  [name] was observed post Covid-19 immunization for 15 minutes without incident. She was provided with Vaccine Information Sheet and instruction to access the V-Safe system.   [name] was instructed to call 911 with any severe reactions post vaccine: Marland Kitchen Difficulty breathing  . Swelling of face and throat  . A fast heartbeat  . A bad rash all over body  . Dizziness and weakness   Immunizations Administered    Name Date Dose VIS Date Route   Pfizer COVID-19 Vaccine 01/27/2020 11:02 AM 0.3 mL 10/16/2019 Intramuscular   Manufacturer: [employer], [employer]   Lot: [id number]   NDC: [address]-4

## 2020-04-07 IMAGING — US US MFM UA CORD DOPPLER
1 series · 13 of 13 positions shown · non-contrast
Comparison: none

[Series 1: us mfm ua cord doppler · 13 of 13 slices shown]
[im 1/13]
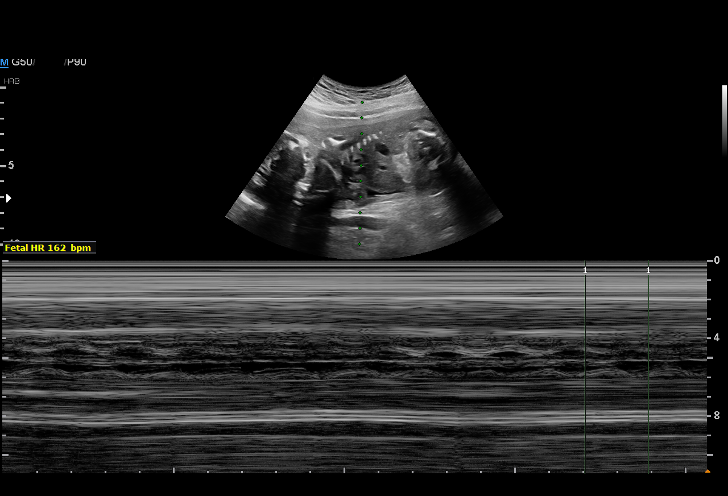
[im 2/13]
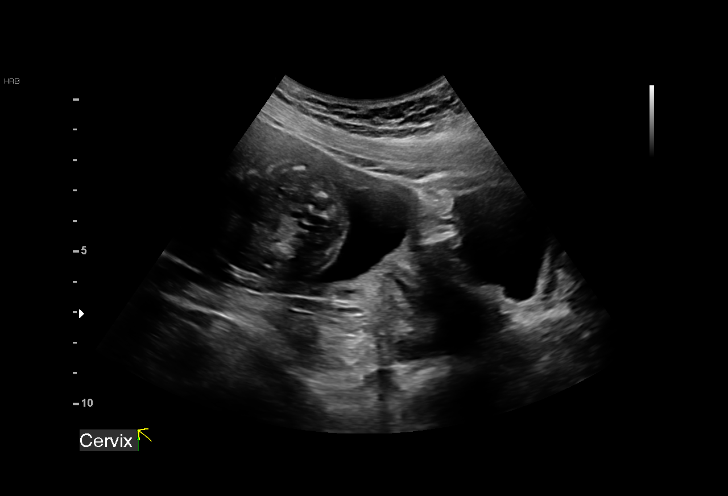
[im 3/13]
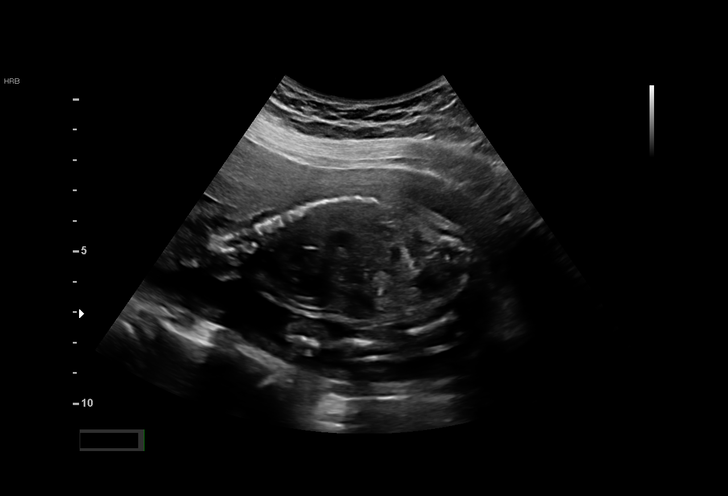
[im 4/13]
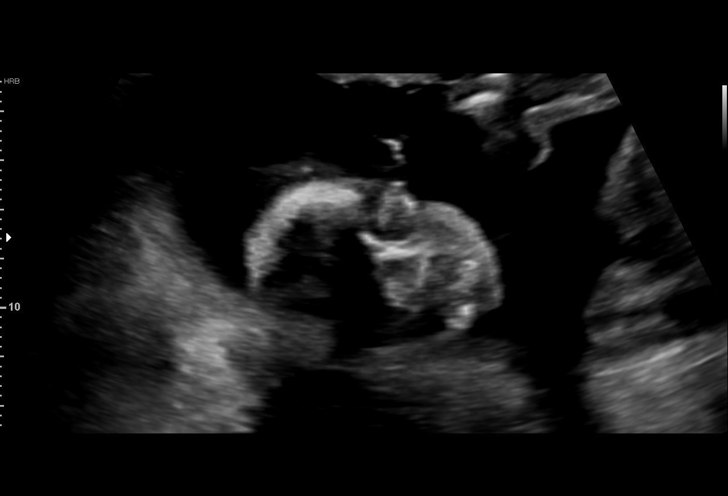
[im 5/13]
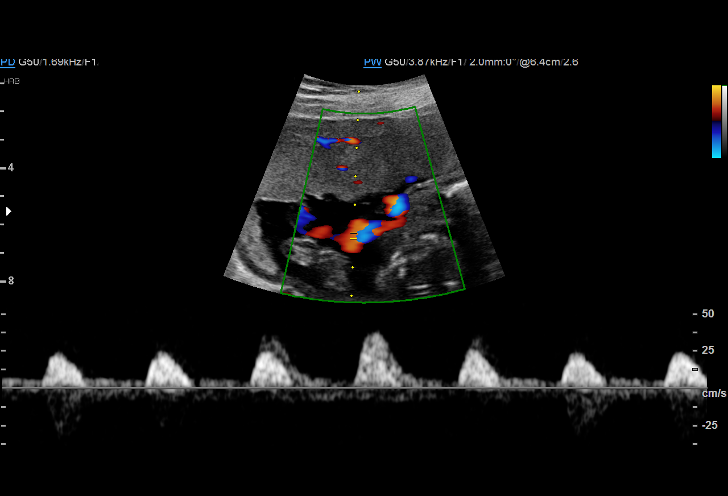
[im 6/13]
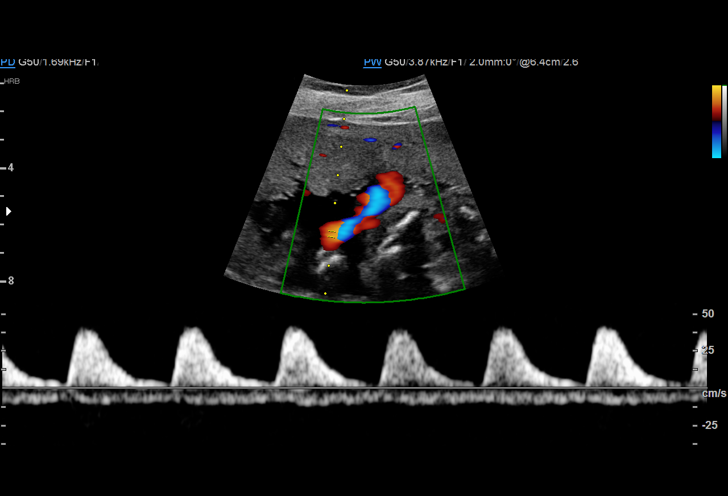
[im 7/13]
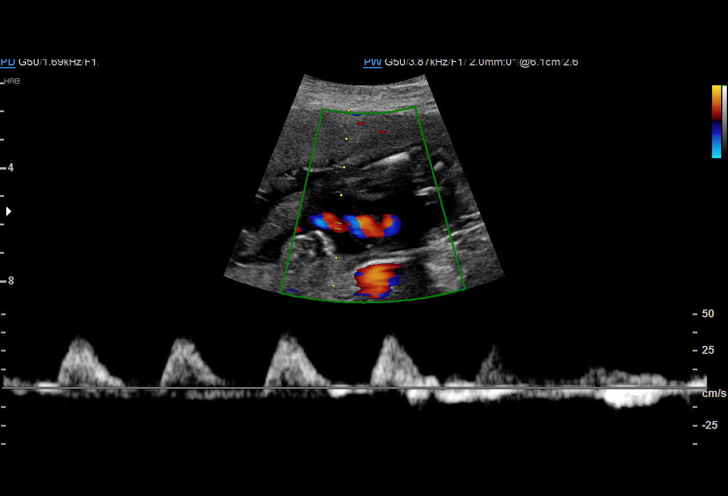
[im 8/13]
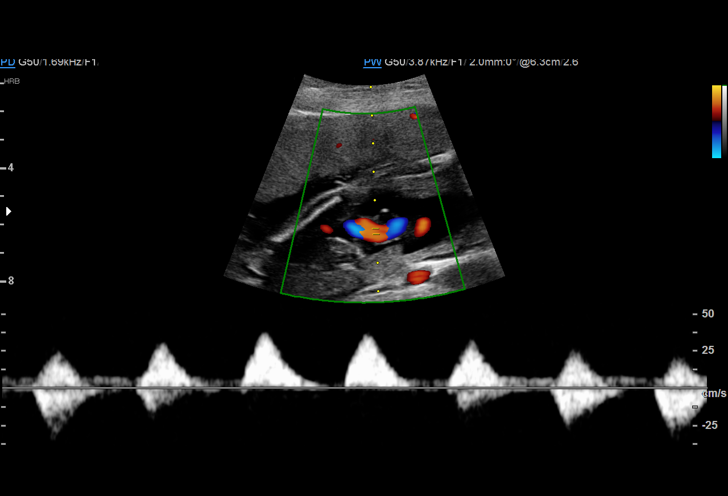
[im 9/13]
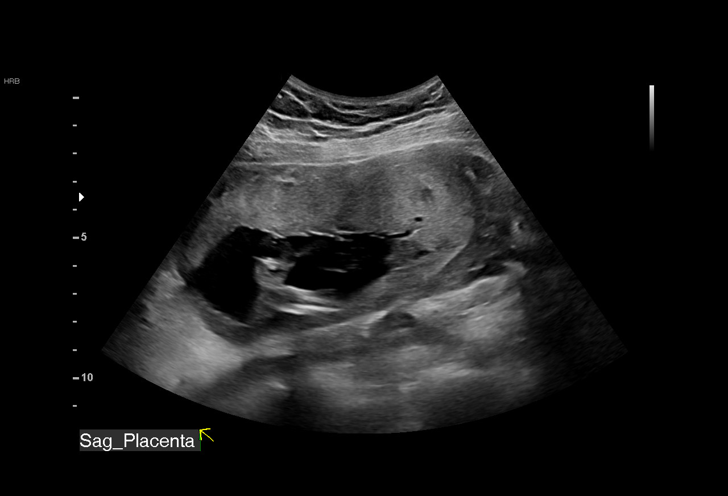
[im 10/13]
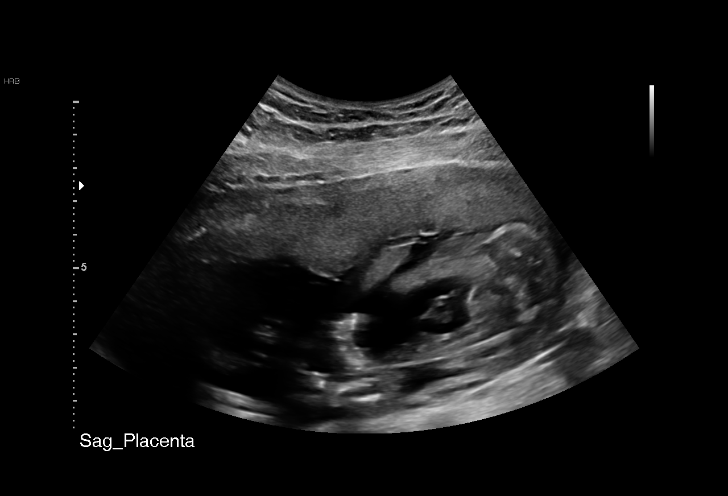
[im 11/13]
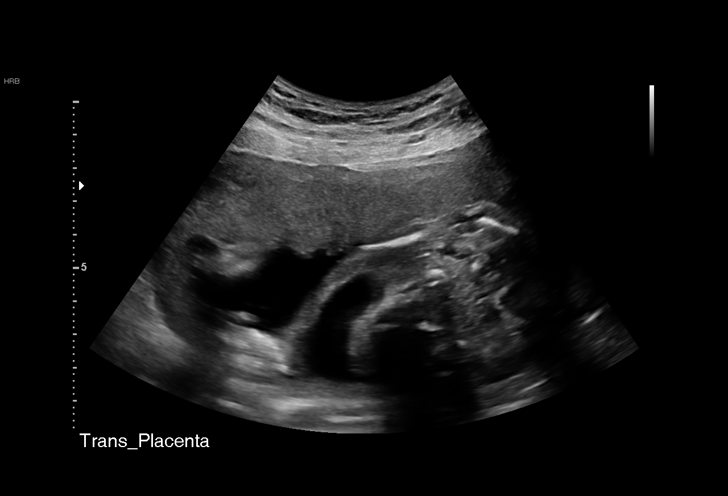
[im 12/13]
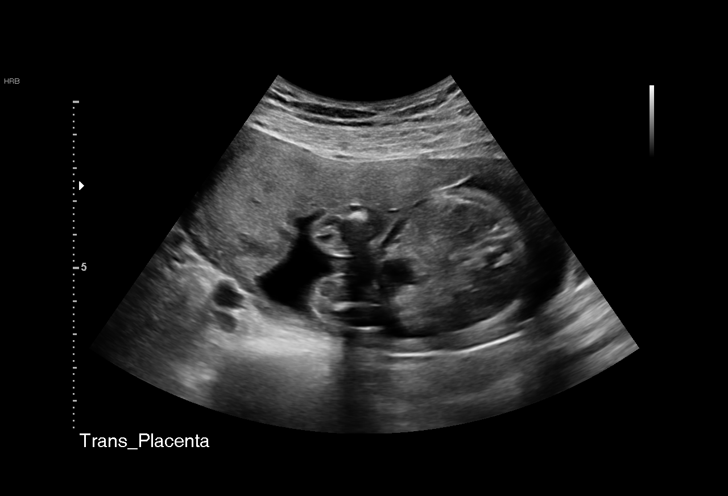
[im 13/13]
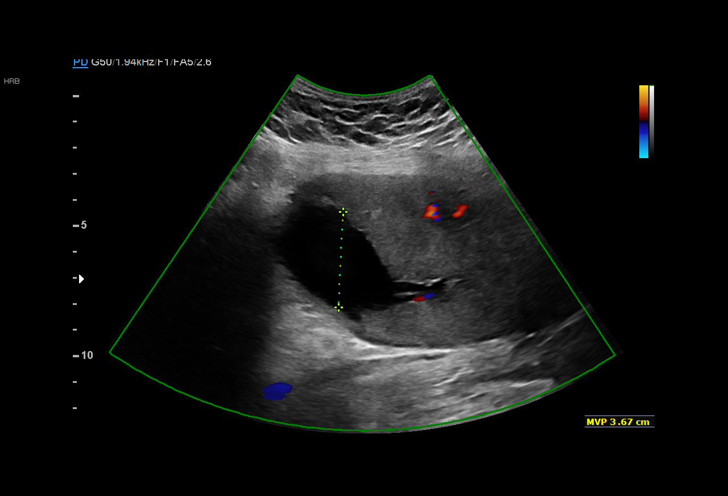

[13 of 13 positions shown; findings below may reference images not displayed]

[REDACTED]care - [HOSPITAL]

  1  US MFM UA CORD DOPPLER               76820.02     DEJOUN BILHAM
 ----------------------------------------------------------------------

 ----------------------------------------------------------------------
Indications

  Maternal care for known or suspected poor
  fetal growth, second trimester, not applicable
  or unspecified
  Obesity complicating pregnancy, second
  trimester
  Abnormal biochemical screen - AFP MoM
  3.03, [facility] [DATE]
  Hypertension - Chronic/Pre-existing (HCTZ)
  24 weeks gestation of pregnancy
 ----------------------------------------------------------------------
Vital Signs

 BMI:
Fetal Evaluation

 Num Of Fetuses:         1
 Fetal Heart Rate(bpm):  162
 Cardiac Activity:       Observed
 Presentation:           Breech
 Placenta:               Anterior

 Amniotic Fluid
 AFI FV:      Within normal limits

                             Largest Pocket(cm)

OB History
 Gravidity:    3         Term:   0        Prem:   0        SAB:   1
 TOP:          1       Ectopic:  0        Living: 0
Gestational Age

 LMP:           24w 2d        Date:  05/13/18                 EDD:   02/17/19
 Best:          24w 2d     Det. By:  LMP  (05/13/18)          EDD:   02/17/19
Doppler - Fetal Vessels

 Umbilical Artery
                                                           ADFV    RDFV
                                                             Yes      No

 Comment:    Intermittent ADFV
Cervix Uterus Adnexa

 Cervix
 Not visualized (advanced GA >18wks)
Impression

 Early-onset severe fetal growth restriction. Patient is here for
 umbilical artery Doppler studies. She has chronic
 hypertension and takes nifedipine XL 30 mg daily. Maternal
 Serum AFP is increased.

 Her blood pressures at our office were 164/99 and 159/104
 mm Hg. She does not have symptoms of severe headache or
 right upper quadrant pain or visual disturbances.

 Amniotic fluid is normal and good fetal activity is seen.
 Umbilical artery Doppler showed persistent absent-end-
 diastolic flow.

 I recommended evaluation at the [facility] for control of
 hypertension. Admission is not necessary for fetal indication
 now. We will continue Doppler studies twice-weekly.

 Patient was sent over to [facility] for evaluation.
Recommendations

 Follow-up Doppler study on 11/03/18.
                      Latonero, Leng Hou

## 2020-04-18 IMAGING — US US MFM UA CORD DOPPLER
1 series · 14 of 23 positions shown · non-contrast
Comparison: none

[Series 1: us mfm ua cord doppler · 14 of 23 slices shown]
[im 1/23]
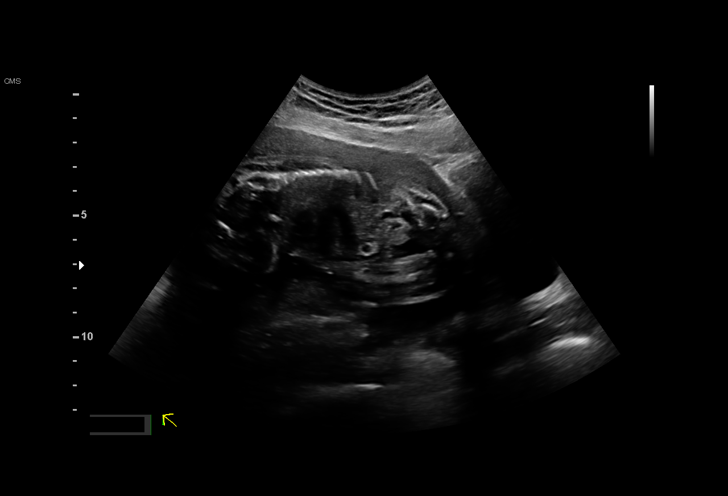
[im 3/23]
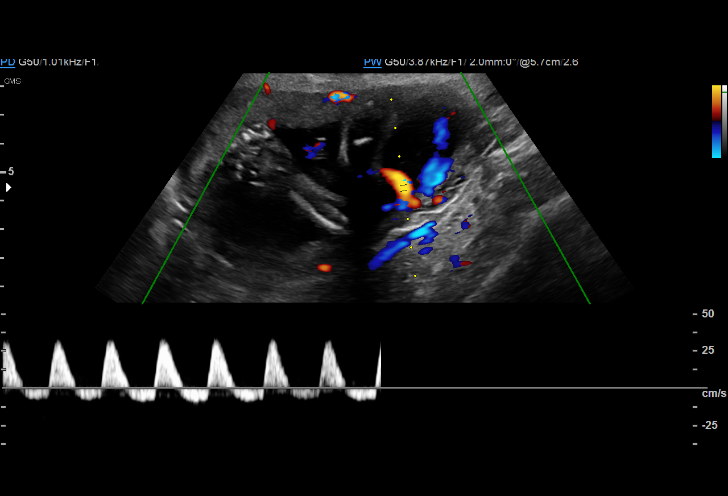
[im 5/23]
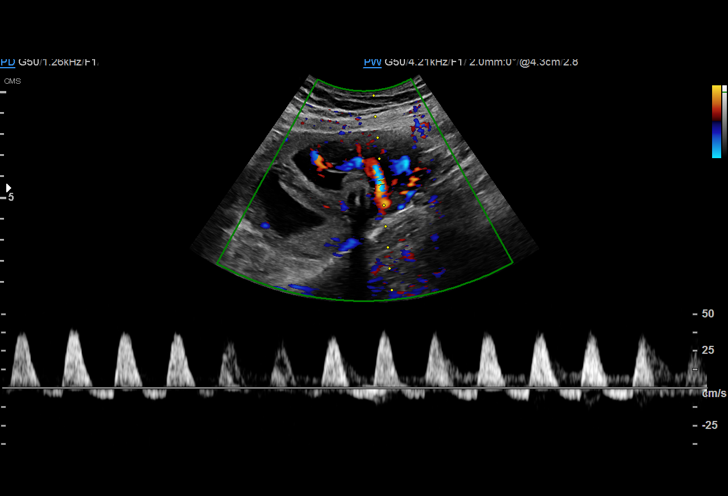
[im 6/23]
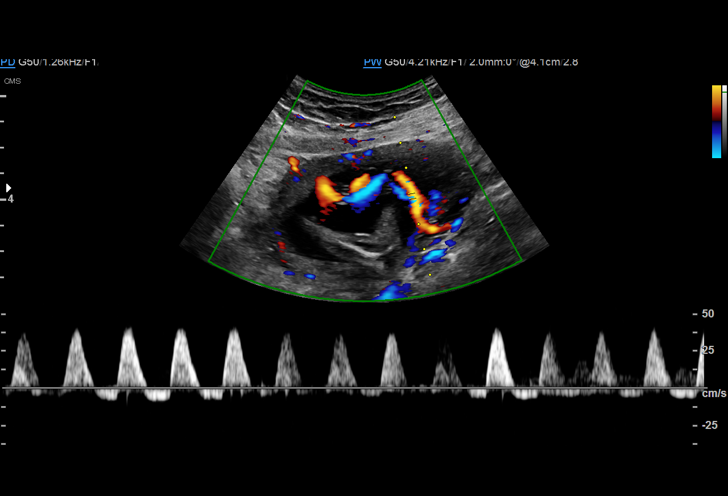
[im 8/23]
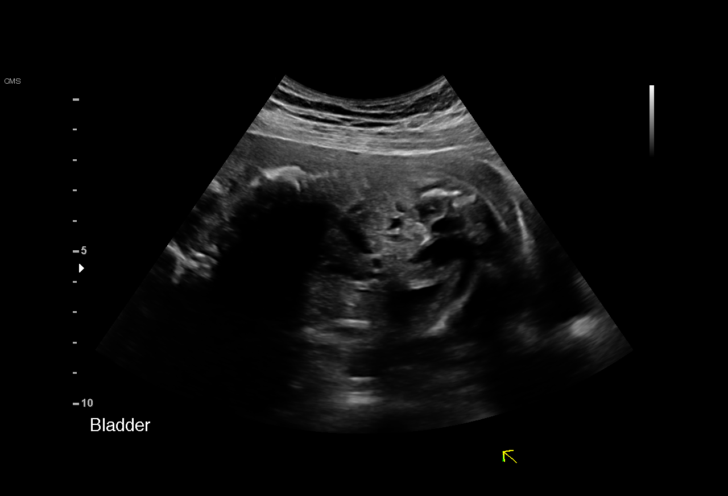
[im 10/23]
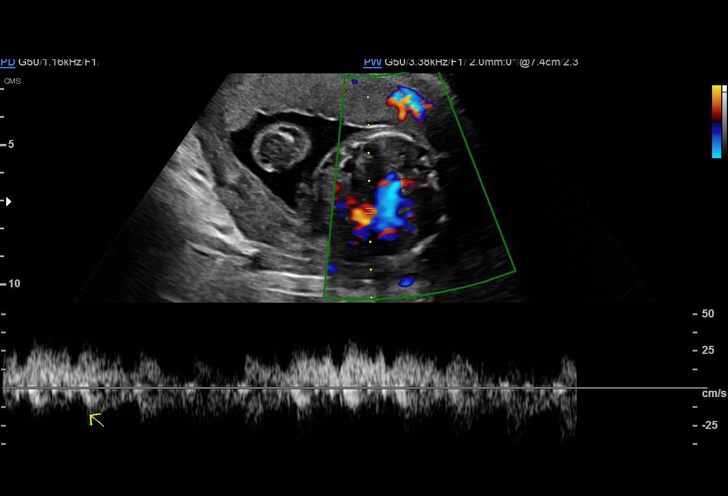
[im 11/23]
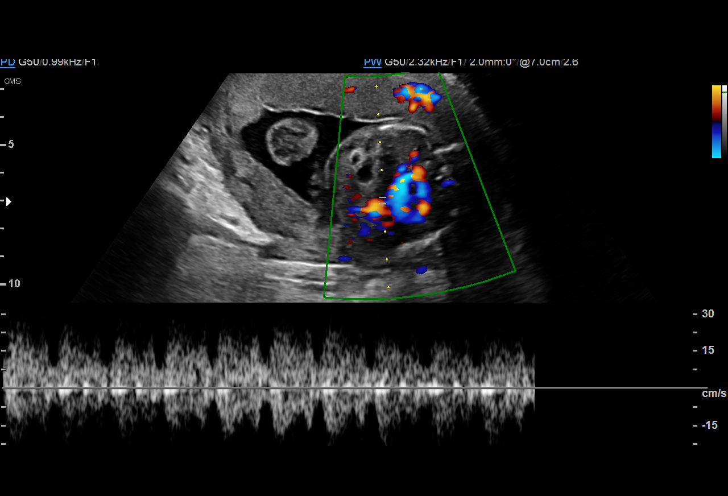
[im 13/23]
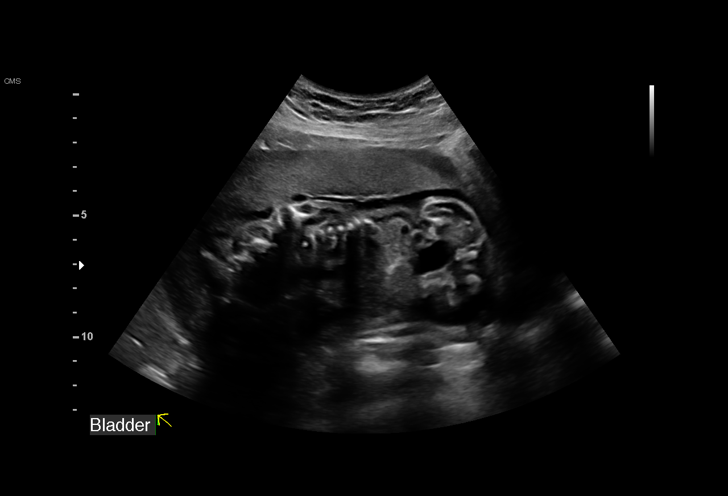
[im 14/23]
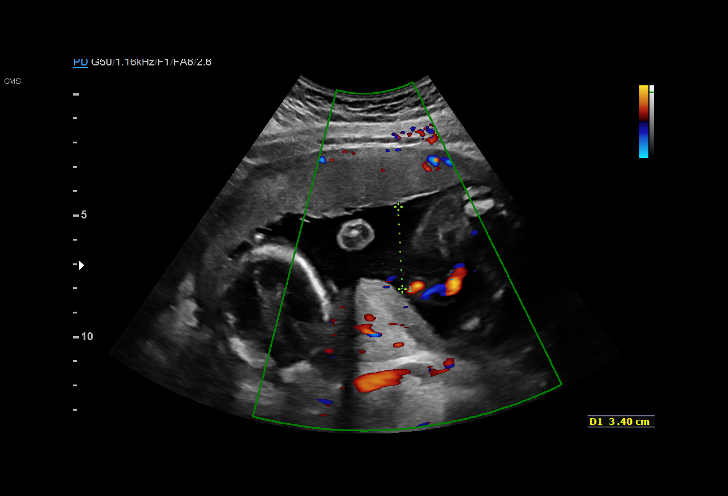
[im 16/23]
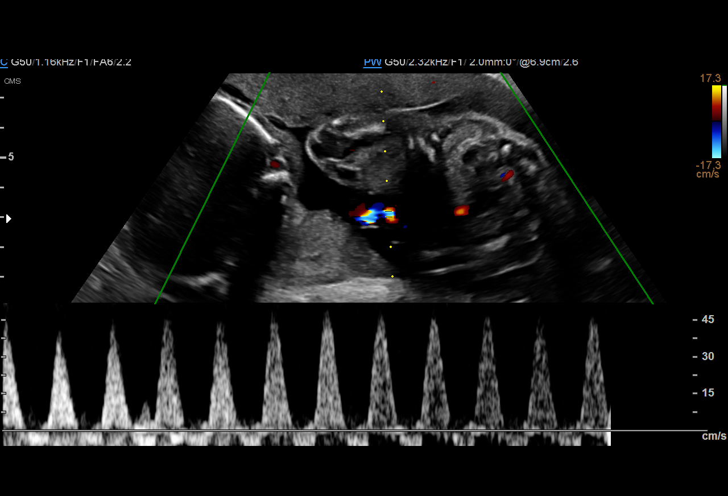
[im 18/23]
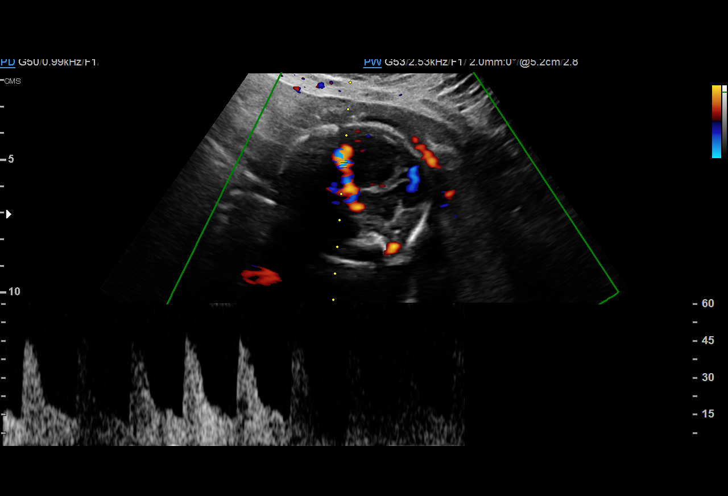
[im 19/23]
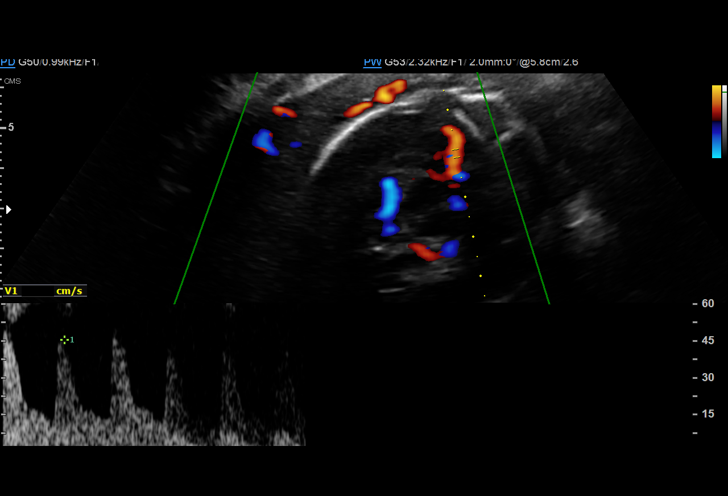
[im 21/23]
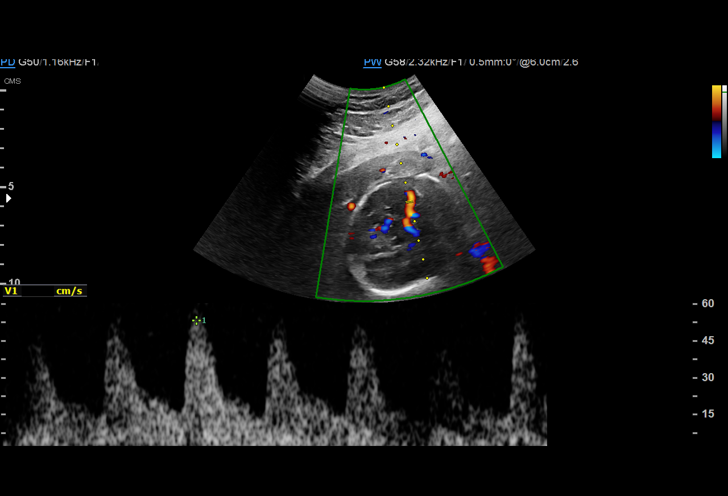
[im 23/23]
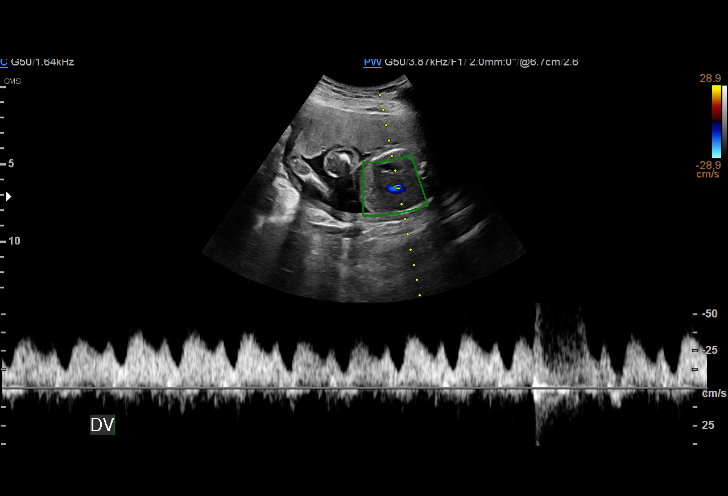

[14 of 23 positions shown; findings below may reference images not displayed]

[REDACTED]care - [HOSPITAL]

  1  US MFM UA CORD DOPPLER               76820.02     CORINA SACKO
 ----------------------------------------------------------------------

 ----------------------------------------------------------------------
Indications

  Maternal care for known or suspected poor
  fetal growth, second trimester, not applicable
  or unspecified
  Abnormal biochemical screen - AFP MoM
  3.03, [facility] [DATE]
  Obesity complicating pregnancy, second
  trimester
  Hypertension - Chronic/Pre-existing (HCTZ)
  25 weeks gestation of pregnancy
 ----------------------------------------------------------------------
Vital Signs

                                                Height:        5'1"
Fetal Evaluation

 Num Of Fetuses:         1
 Fetal Heart Rate(bpm):  159
 Cardiac Activity:       Observed
 Presentation:           Breech

 Amniotic Fluid
 AFI FV:      Within normal limits

                             Largest Pocket(cm)

OB History

 Gravidity:    3         Term:   0        Prem:   0        SAB:   1
 TOP:          1       Ectopic:  0        Living: 0
Gestational Age

 LMP:           25w 6d        Date:  05/13/18                 EDD:   02/17/19
 Best:          25w 6d     Det. By:  LMP  (05/13/18)          EDD:   02/17/19
Doppler - Fetal Vessels

 Umbilical Artery
                                                           ADFV    RDFV
                                                             Yes     Yes

Impression

 Patient returned for Doppler studies. Early-onset IUGR.
 Increased MSAFP on screening. Patient had opted not to
 have amniocentesis. She had MFM consultation after initial
 diagnosis and has opted frequent antenatal testing.

 Amniotic fluid is normal and good fetal activity is seen.
 Breech presentation. Umbilical artery Doppler showed
 intermittent reversed-end-diastolic flow. Ductus venosus
 Doppler study is normal.

 I discussed worsening Doppler and recommend inpatient
 management. Patient is aware of increased risk of perinatal
 mortality and morbidity.
 Patient had one course of antenatal corticosteroids.

 Detailed counseling after next growth assessment.

 Patient will return in 2 hours for admission.
Recommendations

 -Umbilical artery Doppler studies on [place].
 -Fetal growth assessment on her next visit (11/12/18).
                 Canana, Mathews Mccreation

## 2020-04-29 IMAGING — US US MFM OB LIMITED
1 series · 15 of 28 positions shown · non-contrast
Comparison: none

[Series 1: us mfm ob limited · 15 of 32 slices shown]
[im 1/32]
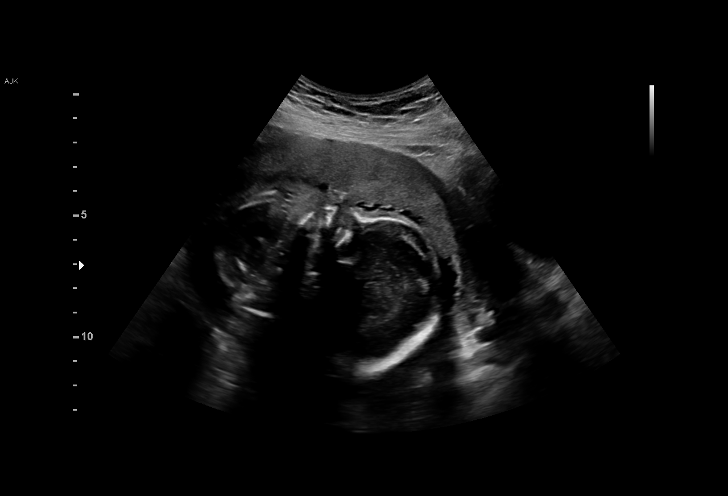
[im 3/32]
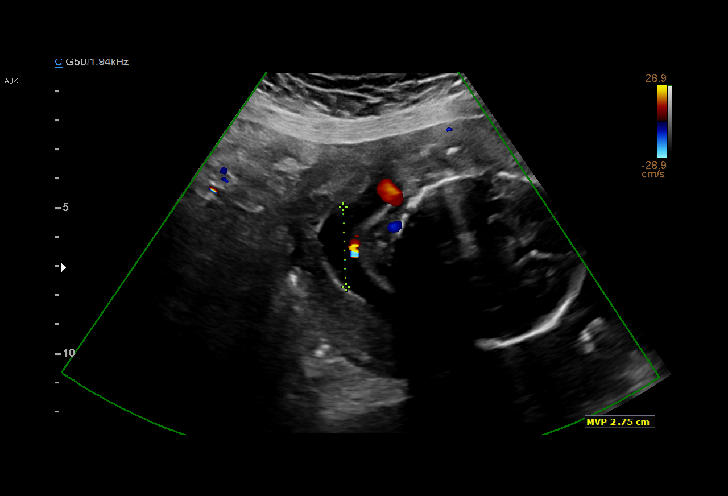
[im 5/32]
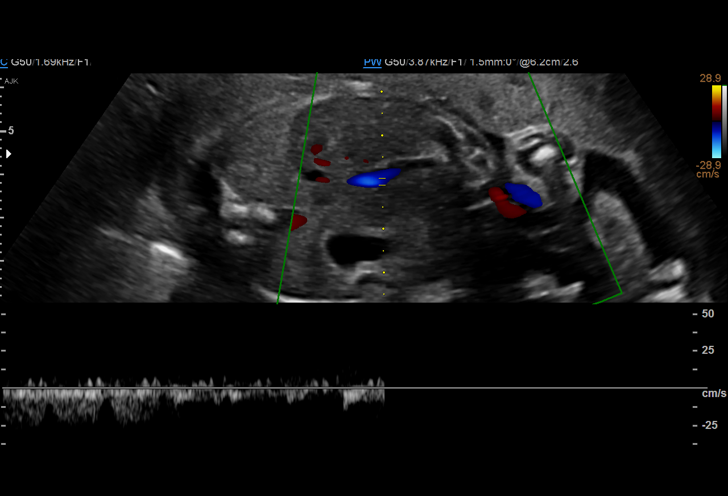
[im 7/32]
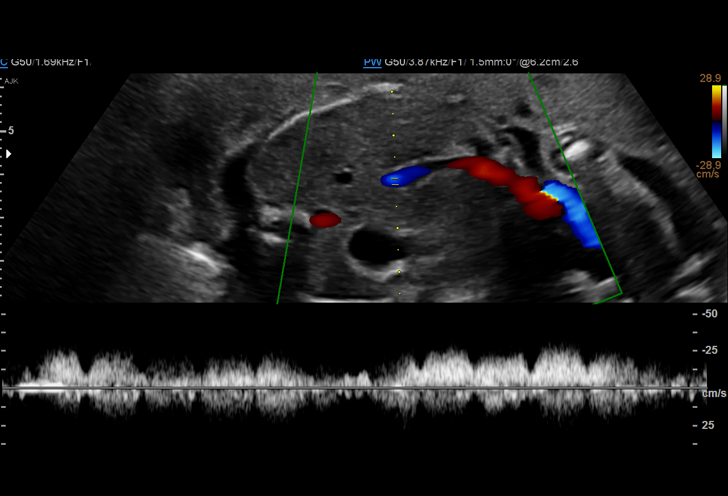
[im 10/32]
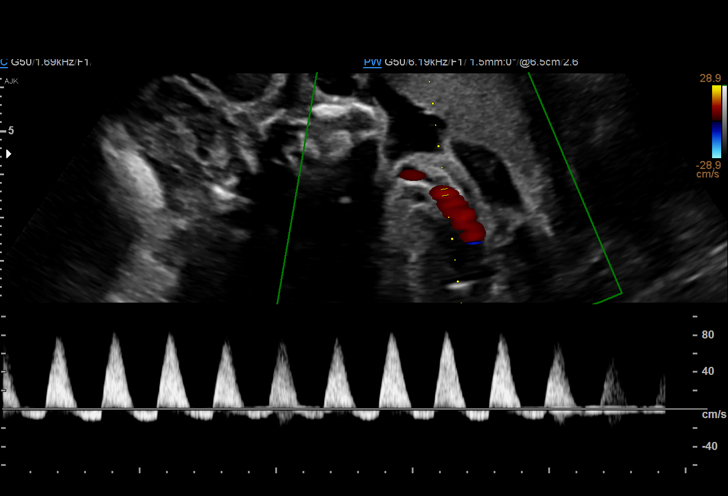
[im 12/32]
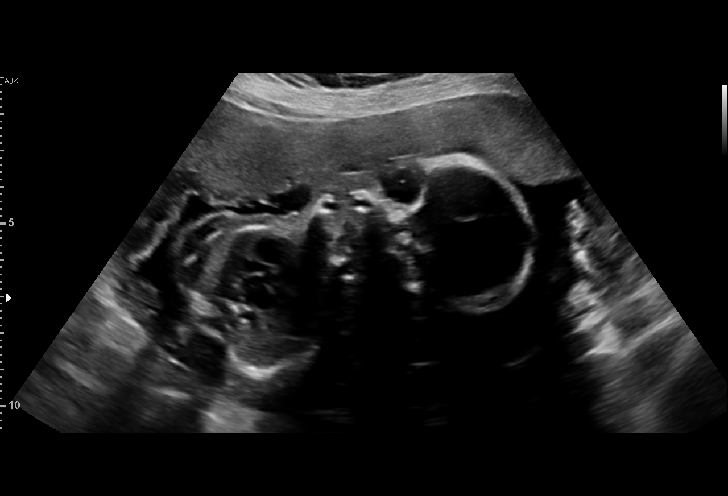
[im 14/32]
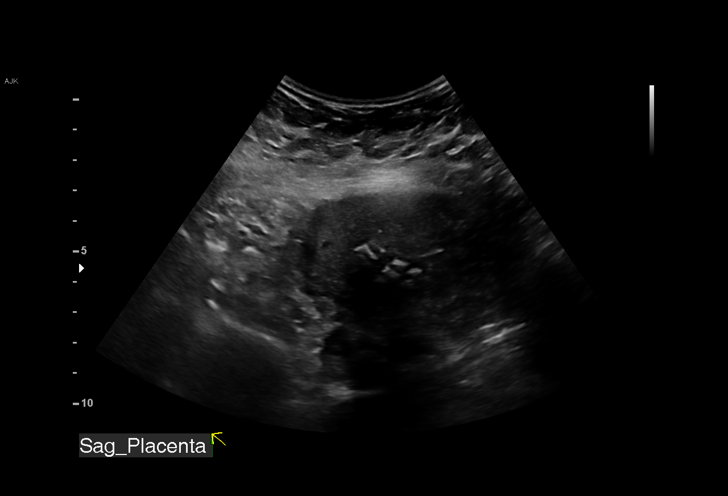
[im 17/32]
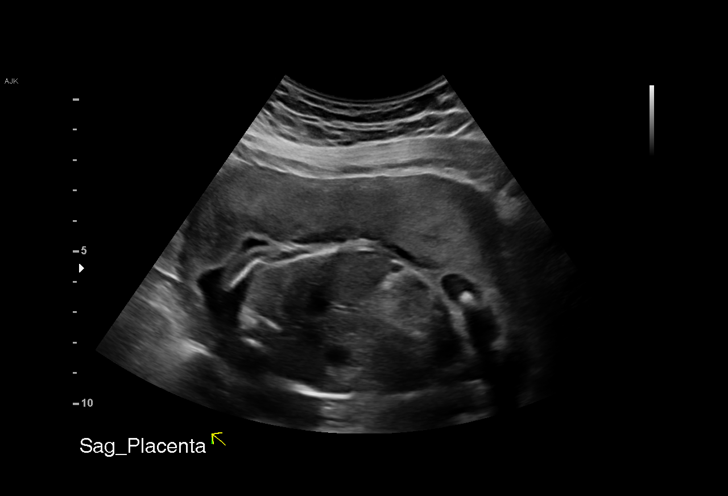
[im 18/32]
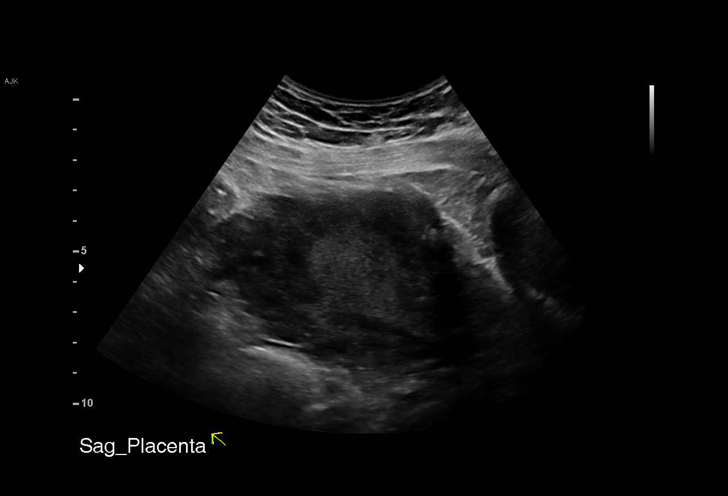
[im 20/32]
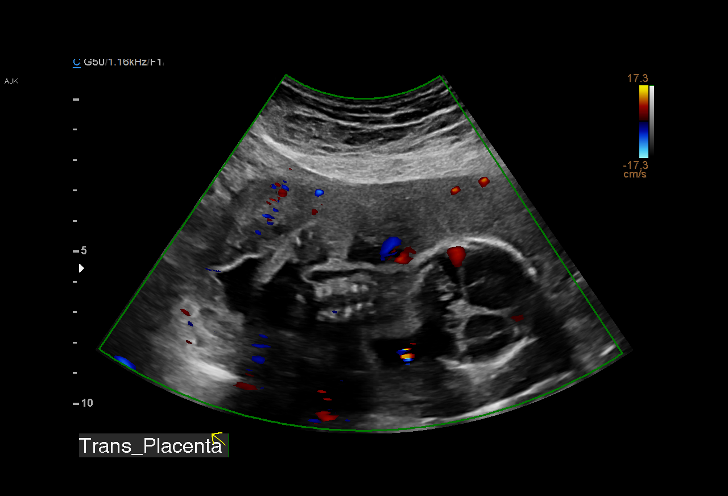
[im 22/32]
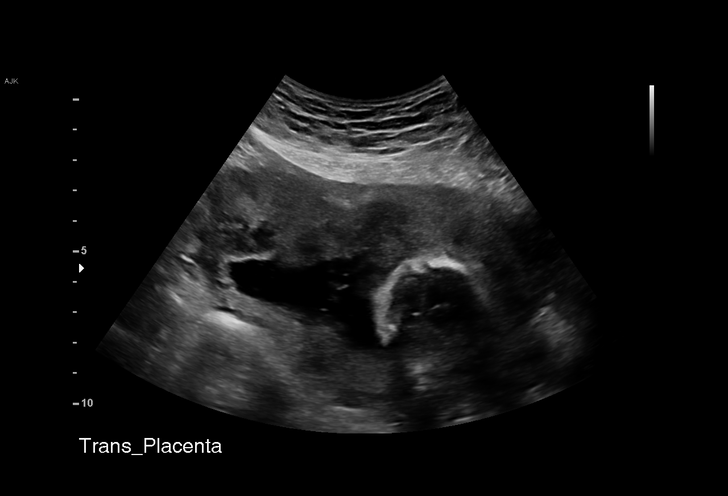
[im 25/32]
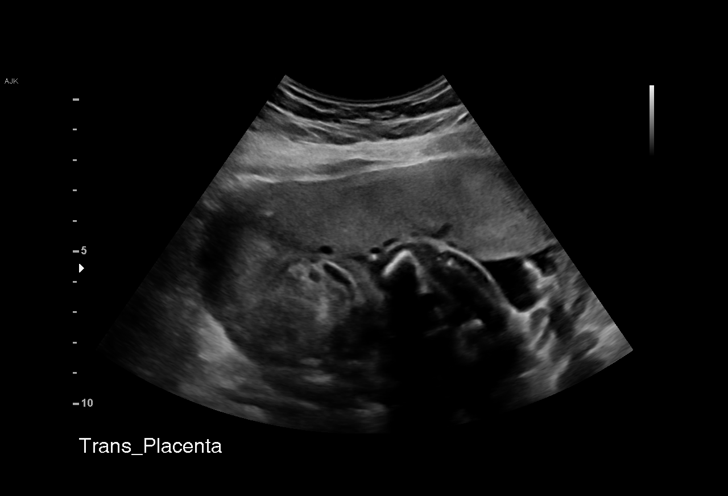
[im 27/32]
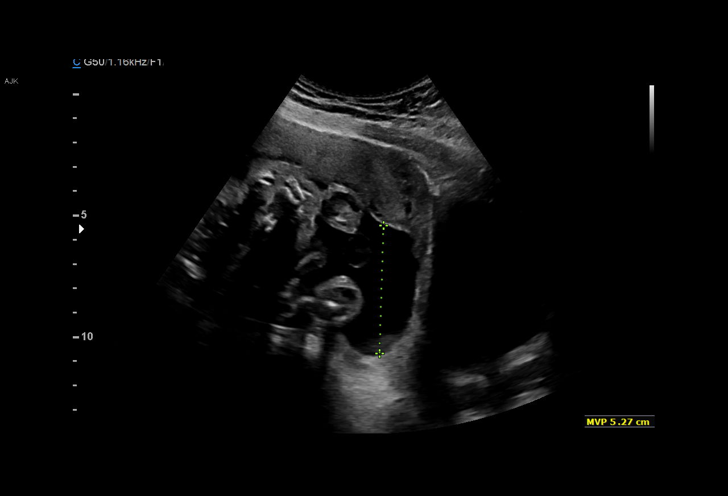
[im 29/32]
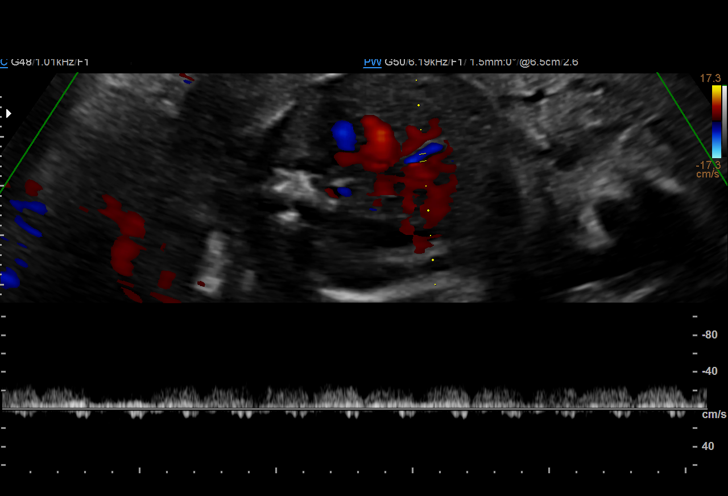
[im 32/32]
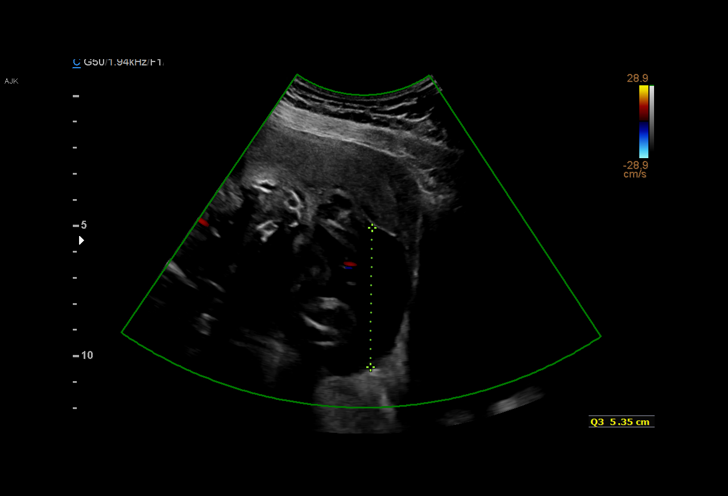

[15 of 28 positions shown; findings below may reference images not displayed]

[REDACTED]care - [HOSPITAL]

 ----------------------------------------------------------------------

 ----------------------------------------------------------------------
Indications

  Maternal care for known or suspected poor
  fetal growth, second trimester, not applicable
  or unspecified
  Abnormal biochemical screen - AFP MoM
  3.03, [facility] [DATE]
  Obesity complicating pregnancy, second
  trimester
  Hypertension - Chronic/Pre-existing (HCTZ)
  27 weeks gestation of pregnancy
 ----------------------------------------------------------------------
Vital Signs

                                                Height:        5'1"
Fetal Evaluation

 Num Of Fetuses:         1
 Fetal Heart Rate(bpm):  153
 Cardiac Activity:       Observed
 Presentation:           Cephalic
 Placenta:               Anterior

 Amniotic Fluid
 AFI FV:      Subjectively low-normal

 AFI Sum(cm)     %Tile       Largest Pocket(cm)
 8.04            < 3
 RUQ(cm)                     LUQ(cm)        LLQ(cm)

OB History

 Gravidity:    3         Term:   0        Prem:   0        SAB:   1
 TOP:          1       Ectopic:  0        Living: 0
Gestational Age

 LMP:           27w 3d        Date:  05/13/18                 EDD:   02/17/19
 Best:          27w 3d     Det. By:  LMP  (05/13/18)          EDD:   02/17/19
Doppler - Fetal Vessels

 Umbilical Artery
                                                           ADFV    RDFV
                                                             Yes     Yes

Impression

 Amniotic fluid is normal and good fetal activity is seen.
 Umbilical artery Doppler showed intermittent reversed-end-
 diastolic flow. Ductus venosus showed no reversed flow.
 NST is reactive for this gestational age with no decelerations.
 Patient still prefers outpatient management.
Recommendations

 -UA Dopplers and NST on [place].
 -BPP from 28 weeks.
                      Belay, Ambreen

## 2020-06-30 ENCOUNTER — Telehealth: Payer: Self-pay

## 2020-06-30 NOTE — Telephone Encounter (Signed)
Samples given and $25 coupon card given today

## 2020-06-30 NOTE — Telephone Encounter (Signed)
Pt states her insurance no longer covers her bcp [facility]  Informed pt I will consult with provider and c/b

## 2022-04-24 ENCOUNTER — Other Ambulatory Visit: Payer: Self-pay

## 2022-04-24 DIAGNOSIS — Z30011 Encounter for initial prescription of contraceptive pills: Secondary | ICD-10-CM

## 2022-04-24 MED ORDER — SLYND 4 MG PO TABS: 1.0000 | ORAL_TABLET | Freq: Every day | ORAL | 0 refills | Status: DC

## 2022-05-29 ENCOUNTER — Ambulatory Visit (INDEPENDENT_AMBULATORY_CARE_PROVIDER_SITE_OTHER): Payer: Medicaid Other | Admitting: Family Medicine

## 2022-05-29 ENCOUNTER — Encounter: Payer: Self-pay | Admitting: Family Medicine

## 2022-05-29 VITALS — BP 143/100 | HR 73 | Wt 188.2 lb

## 2022-05-29 DIAGNOSIS — Z01419 Encounter for gynecological examination (general) (routine) without abnormal findings: Secondary | ICD-10-CM

## 2022-05-29 DIAGNOSIS — I10 Essential (primary) hypertension: Secondary | ICD-10-CM | POA: Diagnosis not present

## 2022-05-29 DIAGNOSIS — Z30011 Encounter for initial prescription of contraceptive pills: Secondary | ICD-10-CM

## 2022-05-29 LAB — POCT URINE PREGNANCY: Preg Test, Ur: NEGATIVE

## 2022-05-29 MED ORDER — SLYND 4 MG PO TABS: 1.0000 | ORAL_TABLET | Freq: Every day | ORAL | 3 refills | Status: DC

## 2022-05-29 NOTE — Progress Notes (Signed)
GYNECOLOGY ANNUAL PREVENTATIVE CARE ENCOUNTER NOTE  Subjective:   Dawn Hoffman is a 31 y.o. [id number] female here for a routine annual gynecologic exam.  Current complaints: none.   Denies abnormal vaginal bleeding, discharge, pelvic pain, problems with intercourse or other gynecologic concerns.    Gynecologic History Patient's last menstrual period was 04/19/2022. Patient is sexually active  Contraception: oral progesterone-only contraceptive Last Pap: 2022. Results were: normal Last mammogram: n/a.  The pregnancy intention screening data noted above was reviewed. Potential methods of contraception were discussed. The patient elected to proceed with No data recorded.   Obstetric History OB History  Gravida Para Term Preterm AB Living  2 1   1 1  0  SAB IAB Ectopic Multiple Live Births  1     0 1    # Outcome Date GA Lbr Len/2nd Weight Sex Delivery Anes PTL Lv  2 Preterm 12/08/18 [redacted]w[redacted]d  1 lb 4.1 oz (0.57 kg) M CS-LTranv Spinal  ND  1 SAB             Past Medical History:  Diagnosis Date   Anemia    Anxiety    Asthma    Dyspnea    Headache    Hypertension     Past Surgical History:  Procedure Laterality Date   CESAREAN SECTION N/A 12/08/2018   Procedure: CESAREAN SECTION;  Surgeon: 02/21/2019, MD;  Location: [facility] BIRTHING SUITES;  Service: Obstetrics;  Laterality: N/A;    Current Outpatient Medications on File Prior to Visit  Medication Sig Dispense Refill   Drospirenone (SLYND) 4 MG TABS Take 1 tablet by mouth daily. 28 tablet 0   olmesartan (BENICAR) 5 MG tablet Take by mouth daily.     ALPRAZolam (XANAX) 0.25 MG tablet Take 1 tablet (0.25 mg total) by mouth 2 (two) times daily. (Patient not taking: Reported on 01/16/2019) 60 tablet 2   Elastic Bandages & Supports (COMFORT FIT MATERNITY SUPP SM) MISC Wear as directed. (Patient not taking: Reported on 12/16/2018) 1 each 0   fexofenadine (ALLEGRA) 180 MG tablet Take 1 tablet (180 mg total) by mouth daily.  (Patient not taking: Reported on 11/20/2019) 30 tablet 11   hydrochlorothiazide (HYDRODIURIL) 12.5 MG tablet Take 12.5 mg by mouth daily. (Patient not taking: Reported on 05/29/2022)     ibuprofen (ADVIL,MOTRIN) 800 MG tablet Take 1 tablet (800 mg total) by mouth every 8 (eight) hours as needed. (Patient not taking: Reported on 11/20/2019) 30 tablet 5   loratadine (CLARITIN) 10 MG tablet Take 1 tablet (10 mg total) by mouth daily. (Patient not taking: Reported on 11/20/2019) 30 tablet 11   NIFEdipine (ADALAT CC) 30 MG 24 hr tablet Take 1 tablet (30 mg total) by mouth 2 (two) times daily. (Patient not taking: Reported on 11/20/2019) 60 tablet 2   oxyCODONE-acetaminophen (PERCOCET/ROXICET) 5-325 MG tablet Take 1 tablet by mouth every 6 (six) hours as needed for severe pain. (Patient not taking: Reported on 12/16/2018) 28 tablet 0   polyethylene glycol (MIRALAX / GLYCOLAX) packet Take 17 g by mouth daily as needed. (Patient not taking: Reported on 01/30/2019) 30 each 0   prenatal vitamin w/FE, FA (PRENATAL 1 + 1) 27-1 MG TABS tablet Take 1 tablet by mouth daily before breakfast. (Patient not taking: Reported on 11/20/2019) 90 each 4   simethicone (MYLICON) 80 MG chewable tablet Chew 1 tablet (80 mg total) by mouth 4 (four) times daily as needed for flatulence. (Patient not taking: Reported on 12/14/2018) 30  tablet 0   No current facility-administered medications on file prior to visit.    No Known Allergies  Social History   Socioeconomic History   Marital status: Single    Spouse name: Not on file   Number of children: Not on file   Years of education: Not on file   Highest education level: Not on file  Occupational History   Occupation: Magazine features editor: [employer]  Tobacco Use   Smoking status: Never   Smokeless tobacco: Never  Vaping Use   Vaping Use: Never used  Substance and Sexual Activity   Alcohol use: No   Drug use: No   Sexual activity: Yes    Birth  control/protection: None  Other Topics Concern   Not on file  Social History Narrative   Not on file   Social Determinants of Health   Financial Resource Strain: Low Risk  (09/18/2018)   Overall Financial Resource Strain (CARDIA)    Difficulty of Paying Living Expenses: Not hard at all  Food Insecurity: Unknown (09/18/2018)   Hunger Vital Sign    Worried About Running Out of Food in the Last Year: Never true    Ran Out of Food in the Last Year: Not on file  Transportation Needs: No Transportation Needs (09/18/2018)   PRAPARE - Administrator, Civil Service (Medical): No    Lack of Transportation (Non-Medical): No  Physical Activity: Not on file  Stress: Not on file  Social Connections: Not on file  Intimate Partner Violence: Not on file    Family History  Problem Relation Age of Onset   Alcohol abuse Father    Drug abuse Father    Cancer Maternal Grandmother    Diabetes Maternal Grandfather     The following portions of the patient's history were reviewed and updated as appropriate: allergies, current medications, past family history, past medical history, past social history, past surgical history and problem list.  Review of Systems Pertinent items are noted in HPI.   Objective:  BP (!) 143/100   Pulse 73   Wt 188 lb 3.2 oz (85.4 kg)   LMP 04/19/2022   BMI 36.76 kg/m  Wt Readings from Last 3 Encounters:  05/29/22 188 lb 3.2 oz (85.4 kg)  11/20/19 178 lb (80.7 kg)  02/05/2019 179 lb 12.8 oz (81.6 kg)     Chaperone present during exam  CONSTITUTIONAL: Well-developed, well-nourished female in no acute distress.  HENT:  Normocephalic, atraumatic, External right and left ear normal. Oropharynx is clear and moist EYES: Conjunctivae and EOM are normal. Pupils are equal, round, and reactive to light. No scleral icterus.  NECK: Normal range of motion, supple, no masses.  Normal thyroid.   CARDIOVASCULAR: Normal heart rate noted, regular rhythm RESPIRATORY:  Clear to auscultation bilaterally. Effort and breath sounds normal, no problems with respiration noted. BREASTS: declined exam ABDOMEN: Soft, normal bowel sounds, no distention noted.  No tenderness, rebound or guarding.  PELVIC: Normal appearing external genitalia; normal appearing vaginal mucosa and cervix.  No abnormal discharge noted.   MUSCULOSKELETAL: Normal range of motion. No tenderness.  No cyanosis, clubbing, or edema.  2+ distal pulses. SKIN: Skin is warm and dry. No rash noted. Not diaphoretic. No erythema. No pallor. NEUROLOGIC: Alert and oriented to person, place, and time. Normal reflexes, muscle tone coordination. No cranial nerve deficit noted. PSYCHIATRIC: Normal mood and affect. Normal behavior. Normal judgment and thought content.  Assessment:  Annual gynecologic examination with pap  smear   Plan:  1. Well Woman Exam Continue POP.  Routine preventative health maintenance measures emphasized. Please refer to After Visit Summary for other counseling recommendations.    Candelaria Celeste, DO [facility]

## 2022-05-29 NOTE — Progress Notes (Signed)
Pt is in the office for annual.  Last pap noted 11/20/2019 and pt states she had a pap last year at PCP Pt states that she did not take BP meds today LMP 04/19/22.  Pt states that she is taking [facility] BC pills and she usually does not get her cycle. Pt reports no abnormal symptoms and declines STD testing

## 2022-05-31 MED ORDER — SLYND 4 MG PO TABS: 1.0000 | ORAL_TABLET | Freq: Every day | ORAL | 3 refills | Status: DC

## 2022-05-31 NOTE — Addendum Note (Signed)
Addended by: Natale Milch D on: 05/31/2022 04:22 PM   Modules accepted: Orders

## 2022-10-10 ENCOUNTER — Ambulatory Visit (INDEPENDENT_AMBULATORY_CARE_PROVIDER_SITE_OTHER): Payer: Medicaid Other | Admitting: General Practice

## 2022-10-10 VITALS — BP 152/92 | HR 83 | Ht 60.0 in | Wt 195.2 lb

## 2022-10-10 DIAGNOSIS — Z3201 Encounter for pregnancy test, result positive: Secondary | ICD-10-CM | POA: Diagnosis not present

## 2022-10-10 DIAGNOSIS — Z3200 Encounter for pregnancy test, result unknown: Secondary | ICD-10-CM

## 2022-10-10 LAB — POCT URINE PREGNANCY: Preg Test, Ur: POSITIVE — AB

## 2022-10-10 NOTE — Progress Notes (Signed)
Dawn Hoffman presents today for UPT. She has no unusual complaints. LMP: 09-05-22    OBJECTIVE: Appears well, in no apparent distress.  OB History     Gravida  2   Para  1   Term      Preterm  1   AB  1   Living  0      SAB  1   IAB      Ectopic      Multiple  0   Live Births  1          Home UPT Result: NA In-Office UPT result: Positive I have reviewed the patient's medical, obstetrical, social, and family histories, and medications.   ASSESSMENT: Positive pregnancy test  PLAN Prenatal care to be completed at: Pt unsure if she wants to continue with this pregnancy. Pt will call to schedule appts if needed.

## 2022-10-25 ENCOUNTER — Telehealth: Payer: Self-pay

## 2022-10-25 DIAGNOSIS — O161 Unspecified maternal hypertension, first trimester: Secondary | ICD-10-CM

## 2022-10-25 DIAGNOSIS — O219 Vomiting of pregnancy, unspecified: Secondary | ICD-10-CM

## 2022-10-25 MED ORDER — DOXYLAMINE-PYRIDOXINE 10-10 MG PO TBEC: 2.0000 | DELAYED_RELEASE_TABLET | Freq: Every day | ORAL | 5 refills | Status: DC

## 2022-10-25 MED ORDER — NIFEDIPINE ER OSMOTIC RELEASE 30 MG PO TB24: 30.0000 mg | ORAL_TABLET | Freq: Every day | ORAL | 2 refills | Status: DC

## 2022-10-25 NOTE — Telephone Encounter (Signed)
TC to patient to inform of medication changes due to pregnancy. Per Dr. Clearance Coots okay to send Procardia 30 mg xl daily. Patient advised to check BP twice a week and report elevated readings to [place]. Patient verbalized understanding.

## 2022-11-07 ENCOUNTER — Encounter: Payer: Self-pay | Admitting: *Deleted

## 2022-11-07 ENCOUNTER — Other Ambulatory Visit: Payer: Self-pay | Admitting: *Deleted

## 2022-11-08 ENCOUNTER — Ambulatory Visit (INDEPENDENT_AMBULATORY_CARE_PROVIDER_SITE_OTHER): Payer: Medicaid Other

## 2022-11-08 ENCOUNTER — Ambulatory Visit: Payer: Medicaid Other | Admitting: *Deleted

## 2022-11-08 DIAGNOSIS — O3680X0 Pregnancy with inconclusive fetal viability, not applicable or unspecified: Secondary | ICD-10-CM | POA: Diagnosis not present

## 2022-11-08 DIAGNOSIS — Z3A08 8 weeks gestation of pregnancy: Secondary | ICD-10-CM

## 2022-11-08 DIAGNOSIS — O0991 Supervision of high risk pregnancy, unspecified, first trimester: Secondary | ICD-10-CM | POA: Diagnosis not present

## 2022-11-08 DIAGNOSIS — O0990 Supervision of high risk pregnancy, unspecified, unspecified trimester: Secondary | ICD-10-CM

## 2022-11-08 MED ORDER — BLOOD PRESSURE KIT DEVI: 1.0000 | 0 refills | Status: AC

## 2022-11-08 NOTE — Progress Notes (Signed)
New OB Intake  I connected withNAME@  on 11/08/22 at  8:15 AM EST by In Person Visit and verified that I am speaking with the correct person using two identifiers. Nurse is located at [facility] and pt is located at [place].  I discussed the limitations, risks, security and privacy concerns of performing an evaluation and management service by telephone and the availability of in person appointments. I also discussed with the patient that there may be a patient responsible charge related to this service. The patient expressed understanding and agreed to proceed.  I explained I am completing New OB Intake today. We discussed EDD of 06/12/23 that is based on LMP of 09/05/22. Pt is G3/P0. I reviewed her allergies, medications, Medical/Surgical/OB history, and appropriate screenings. I informed her of [facility] services. [facility] information placed in AVS. Based on history, this is a high risk pregnancy.  Patient Active Problem List   Diagnosis Date Noted   Indication for care in labor or delivery 12/08/2018   Abnormal fetal ultrasound 11/10/2018   Encounter for supervision of normal first pregnancy in second trimester 09/17/2018   MIGRAINE, UNSPEC., W/O INTRACTABLE MIGRAINE 01/02/2007   HYPERTENSION, BENIGN SYSTEMIC 01/02/2007   RHINITIS, ALLERGIC 01/02/2007   ASTHMA, UNSPECIFIED 01/02/2007    Concerns addressed today  Delivery Plans Plans to deliver at [facility]. Patient given information for [facility] Healthy Baby website for more information about [facility] and [place]. Patient is not interested in water birth. Offered upcoming OB visit with CNM to discuss further.  MyChart/Babyscripts MyChart access verified. I explained pt will have some visits in office and some virtually. Babyscripts instructions given and order placed. Patient verifies receipt of registration text/e-mail. Account successfully created and app downloaded.  Blood Pressure Cuff/Weight Scale Blood pressure cuff ordered for patient to  pick-up from [employer]. Explained after first prenatal appt pt will check weekly and document in 54. Patient does not have weight scale; patient may purchase if they desire to track weight weekly in Babyscripts.  Anatomy US Explained first scheduled [place] will be around 19 weeks. Anatomy US scheduled for 19 wk at MFM. Pt notified to arrive at TBD.  Labs Discussed Johnsie Cancel genetic screening with patient. Would like both Panorama and Horizon drawn at new OB visit. Routine prenatal labs needed.  COVID Vaccine Patient has had COVID vaccine.   Social Determinants of Health Food Insecurity: Patient denies food insecurity. WIC Referral: Patient is interested in referral to [facility].  Transportation: Patient denies transportation needs. Childcare: Discussed no children allowed at ultrasound appointments. Offered childcare services; patient declines childcare services at this time.  First visit review I reviewed new OB appt with patient. I explained they will have a provider visit that includes pelvic exam and labs. Explained pt will be seen by Dr. Elly Modena at first visit; encounter routed to appropriate provider. Explained that patient will be seen by pregnancy navigator following visit with provider.   Penny Pia, RN 11/08/2022  8:16 AM

## 2022-11-10 LAB — CBC/D/PLT+RPR+RH+ABO+RUBIGG...
Antibody Screen: NEGATIVE
Basophils Absolute: 0 10*3/uL (ref 0.0–0.2)
Basos: 0 %
EOS (ABSOLUTE): 0 10*3/uL (ref 0.0–0.4)
Eos: 0 %
HCV Ab: NONREACTIVE
HIV Screen 4th Generation wRfx: NONREACTIVE
Hematocrit: 37.2 % (ref 34.0–46.6)
Hemoglobin: 12.2 g/dL (ref 11.1–15.9)
Hepatitis B Surface Ag: NEGATIVE
Immature Grans (Abs): 0 10*3/uL (ref 0.0–0.1)
Immature Granulocytes: 0 %
Lymphocytes Absolute: 1.6 10*3/uL (ref 0.7–3.1)
Lymphs: 23 %
MCH: 25.7 pg — ABNORMAL LOW (ref 26.6–33.0)
MCHC: 32.8 g/dL (ref 31.5–35.7)
MCV: 78 fL — ABNORMAL LOW (ref 79–97)
Monocytes Absolute: 0.6 10*3/uL (ref 0.1–0.9)
Monocytes: 9 %
Neutrophils Absolute: 4.6 10*3/uL (ref 1.4–7.0)
Neutrophils: 68 %
Platelets: 242 10*3/uL (ref 150–450)
RBC: 4.75 x10E6/uL (ref 3.77–5.28)
RDW: 12.1 % (ref 11.7–15.4)
RPR Ser Ql: NONREACTIVE
Rh Factor: POSITIVE
Rubella Antibodies, IGG: 1.73 index (ref >0.99)
WBC: 6.9 10*3/uL (ref 3.4–10.8)

## 2022-11-10 LAB — COMPREHENSIVE METABOLIC PANEL
ALT: 11 IU/L (ref 0–32)
AST: 13 IU/L (ref 0–40)
Albumin/Globulin Ratio: 1.5 (ref 1.2–2.2)
Albumin: 4.3 g/dL (ref 3.9–4.9)
Alkaline Phosphatase: 27 IU/L — ABNORMAL LOW (ref 44–121)
BUN/Creatinine Ratio: 11 (ref 9–23)
BUN: 6 mg/dL (ref 6–20)
Bilirubin Total: 0.2 mg/dL (ref 0.0–1.2)
CO2: 23 mmol/L (ref 20–29)
Calcium: 9.9 mg/dL (ref 8.7–10.2)
Chloride: 98 mmol/L (ref 96–106)
Creatinine, Ser: 0.57 mg/dL (ref 0.57–1.00)
Globulin, Total: 2.8 g/dL (ref 1.5–4.5)
Glucose: 75 mg/dL (ref 70–99)
Potassium: 3.9 mmol/L (ref 3.5–5.2)
Sodium: 137 mmol/L (ref 134–144)
Total Protein: 7.1 g/dL (ref 6.0–8.5)
eGFR: 123 mL/min/{1.73_m2} (ref >59)

## 2022-11-10 LAB — HEMOGLOBIN A1C
Est. average glucose Bld gHb Est-mCnc: 117 mg/dL
Hgb A1c MFr Bld: 5.7 % — ABNORMAL HIGH (ref 4.8–5.6)

## 2022-11-10 LAB — HCV INTERPRETATION

## 2022-11-12 ENCOUNTER — Telehealth: Payer: Self-pay | Admitting: Emergency Medicine

## 2022-11-12 NOTE — Telephone Encounter (Signed)
-----   Message from Mora Bellman, MD sent at 11/12/2022 11:55 AM EST ----- Please inform patient of abnormal diabetes screening test and need for early glucola test. Please schedule prior to next appointment  Thanks  [facility], [facility], [facility]

## 2022-11-12 NOTE — Telephone Encounter (Signed)
Attempted TC to patient to discuss results. LVM to [facility]

## 2022-11-15 ENCOUNTER — Other Ambulatory Visit: Payer: Medicaid Other

## 2022-11-15 DIAGNOSIS — O0991 Supervision of high risk pregnancy, unspecified, first trimester: Secondary | ICD-10-CM

## 2022-11-16 LAB — GLUCOSE TOLERANCE, 2 HOURS W/ 1HR
Glucose, 1 hour: 114 mg/dL (ref 70–179)
Glucose, 2 hour: 93 mg/dL (ref 70–152)
Glucose, Fasting: 78 mg/dL (ref 70–91)

## 2022-11-21 ENCOUNTER — Encounter: Payer: Self-pay | Admitting: Obstetrics and Gynecology

## 2022-11-21 ENCOUNTER — Other Ambulatory Visit (HOSPITAL_COMMUNITY)
Admission: RE | Admit: 2022-11-21 | Discharge: 2022-11-21 | Disposition: A | Discharge status: Home / Self Care | Payer: Medicaid Other | Source: Ambulatory Visit | Attending: Obstetrics and Gynecology | Admitting: Obstetrics and Gynecology

## 2022-11-21 ENCOUNTER — Ambulatory Visit (INDEPENDENT_AMBULATORY_CARE_PROVIDER_SITE_OTHER): Payer: Medicaid Other | Admitting: Obstetrics and Gynecology

## 2022-11-21 VITALS — BP 135/89 | HR 80 | Wt 194.0 lb

## 2022-11-21 DIAGNOSIS — O169 Unspecified maternal hypertension, unspecified trimester: Secondary | ICD-10-CM

## 2022-11-21 DIAGNOSIS — O99211 Obesity complicating pregnancy, first trimester: Secondary | ICD-10-CM | POA: Diagnosis not present

## 2022-11-21 DIAGNOSIS — Z3A11 11 weeks gestation of pregnancy: Secondary | ICD-10-CM

## 2022-11-21 DIAGNOSIS — O99210 Obesity complicating pregnancy, unspecified trimester: Secondary | ICD-10-CM | POA: Insufficient documentation

## 2022-11-21 DIAGNOSIS — O161 Unspecified maternal hypertension, first trimester: Secondary | ICD-10-CM

## 2022-11-21 DIAGNOSIS — O0990 Supervision of high risk pregnancy, unspecified, unspecified trimester: Secondary | ICD-10-CM | POA: Diagnosis present

## 2022-11-21 DIAGNOSIS — O34219 Maternal care for unspecified type scar from previous cesarean delivery: Secondary | ICD-10-CM | POA: Insufficient documentation

## 2022-11-21 DIAGNOSIS — O10913 Unspecified pre-existing hypertension complicating pregnancy, third trimester: Secondary | ICD-10-CM | POA: Insufficient documentation

## 2022-11-21 MED ORDER — ASPIRIN 81 MG PO TBEC: 81.0000 mg | DELAYED_RELEASE_TABLET | Freq: Every day | ORAL | 2 refills | Status: DC

## 2022-11-21 MED ORDER — NIFEDIPINE ER OSMOTIC RELEASE 30 MG PO TB24: 30.0000 mg | ORAL_TABLET | Freq: Every day | ORAL | 4 refills | Status: DC

## 2022-11-21 NOTE — Progress Notes (Signed)
Subjective:    Dawn Hoffman is a G5P0110 [redacted]w[redacted]d being seen today for her first obstetrical visit.  Her obstetrical history is significant for maternal obesity, CHTN, previous iatrogenic preterm delivery at 37 weeks secondary to FGR with abnormal dopplers with neonatal demise at day 5. Patient does intend to breast feed. Pregnancy history fully reviewed.  Patient reports no complaints.  Vitals:   11/21/22 1329  BP: 135/89  Pulse: 80  Weight: 194 lb (88 kg)    HISTORY: OB History  Gravida Para Term Preterm AB Living  3 1   1 1  0  SAB IAB Ectopic Multiple Live Births  1     0 1    # Outcome Date GA Lbr Len/2nd Weight Sex Delivery Anes PTL Lv  3 Current           2 Preterm 12/08/18 [redacted]w[redacted]d  1 lb 4.1 oz (0.57 kg) M CS-LTranv Spinal  ND  1 SAB            Past Medical History:  Diagnosis Date   Anemia    Anxiety    Asthma    Dyspnea    Headache    Hypertension    Past Surgical History:  Procedure Laterality Date   CESAREAN SECTION N/A 12/08/2018   Procedure: CESAREAN SECTION;  Surgeon: Chancy Milroy, MD;  Location: [facility];  Service: Obstetrics;  Laterality: N/A;   Family History  Problem Relation Age of Onset   Cancer Maternal Grandmother    Diabetes Maternal Grandfather    Alcohol abuse Father    Drug abuse Father    Anemia Mother      Exam    Uterus:     Pelvic Exam:    Perineum: No Hemorrhoids, Normal Perineum   Vulva: normal   Vagina:  normal mucosa, normal discharge   pH:    Cervix: nulliparous appearance and cervix is closed and long   Adnexa: normal adnexa and no mass, fullness, tenderness   Bony Pelvis: gynecoid  System: Breast:  normal appearance, no masses or tenderness   Skin: normal coloration and turgor, no rashes    Neurologic: normal, no focal deficits   Extremities: normal strength, tone, and muscle mass   HEENT extra ocular movement intact   Mouth/Teeth mucous membranes moist, pharynx normal without lesions and dental  hygiene good   Neck supple and no masses   Cardiovascular: regular rate and rhythm   Respiratory:  appears well, vitals normal, no respiratory distress, acyanotic, normal RR, chest clear, no wheezing, crepitations, rhonchi, normal symmetric air entry   Abdomen: soft, non-tender; bowel sounds normal; no masses,  no organomegaly   Urinary:    Pt informed that the ultrasound is considered a limited OB ultrasound and is not intended to be a complete ultrasound exam.  Patient also informed that the ultrasound is not being completed with the intent of assessing for fetal or placental anomalies or any pelvic abnormalities.  Explained that the purpose of today's ultrasound is to assess for  viability. Ultrasound revealed a viable intrauterine pregnancy with visible fetal movement.  Patient acknowledges the purpose of the exam and the limitations of the study.      Assessment:    Pregnancy: G3P0110 Patient Active Problem List   Diagnosis Date Noted   Hypertension affecting pregnancy, antepartum 11/21/2022   Previous cesarean delivery affecting pregnancy, antepartum 11/21/2022   Obesity affecting pregnancy, antepartum 11/21/2022   Supervision of high risk pregnancy, antepartum 11/08/2022   Abnormal  fetal ultrasound 11/10/2018   MIGRAINE, UNSPEC., W/O INTRACTABLE MIGRAINE 01/02/2007   HYPERTENSION, BENIGN SYSTEMIC 01/02/2007   RHINITIS, ALLERGIC 01/02/2007   ASTHMA, UNSPECIFIED 01/02/2007        Plan:     Initial labs drawn. Prenatal vitamins. Problem list reviewed and updated. Genetic Screening discussed : panorama ordered.  Ultrasound discussed; fetal survey: ordered. Rx ASA provided Patient referred to [facility] Patient interested in doula referral Refill on procardia provided  Follow up in 4 weeks. 50% of 30 min visit spent on counseling and coordination of care.     Evren Shankland 11/21/2022

## 2022-11-21 NOTE — Progress Notes (Signed)
NOB, declined FLU vaccine.  Needs refills on BP Rx, Pt wants to know if it is ok to take Total Beets Supplements along with BP Rx?  C/o intermittent headaches 5/10 x 3+ weeks.

## 2022-11-21 NOTE — Patient Instructions (Signed)
First Trimester of Pregnancy  The first trimester of pregnancy starts on the first day of your last menstrual period until the end of week 12. This is months 1 through 3 of pregnancy. A week after a sperm fertilizes an egg, the egg will implant into the wall of the uterus and begin to develop into a baby. By the end of 12 weeks, all the baby's organs will be formed and the baby will be 2-3 inches in size. Body changes during your first trimester Your body goes through many changes during pregnancy. The changes vary and generally return to normal after your baby is born. Physical changes You may gain or lose weight. Your breasts may begin to grow larger and become tender. The tissue that surrounds your nipples (areola) may become darker. Dark spots or blotches (chloasma or mask of pregnancy) may develop on your face. You may have changes in your hair. These can include thickening or thinning of your hair or changes in texture. Health changes You may feel nauseous, and you may vomit. You may have heartburn. You may develop headaches. You may develop constipation. Your gums may bleed and may be sensitive to brushing and flossing. Other changes You may tire easily. You may urinate more often. Your menstrual periods will stop. You may have a loss of appetite. You may develop cravings for certain kinds of food. You may have changes in your emotions from day to day. You may have more vivid and strange dreams. Follow these instructions at home: Medicines Follow your health care provider's instructions regarding medicine use. Specific medicines may be either safe or unsafe to take during pregnancy. Do not take any medicines unless told to by your health care provider. Take a prenatal vitamin that contains at least 600 micrograms (mcg) of folic acid. Eating and drinking Eat a healthy diet that includes fresh fruits and vegetables, whole grains, good sources of protein such as meat, eggs, or tofu,  and low-fat dairy products. Avoid raw meat and unpasteurized juice, milk, and cheese. These carry germs that can harm you and your baby. If you feel nauseous or you vomit: Eat 4 or 5 small meals a day instead of 3 large meals. Try eating a few soda crackers. Drink liquids between meals instead of during meals. You may need to take these actions to prevent or treat constipation: Drink enough fluid to keep your urine pale yellow. Eat foods that are high in fiber, such as beans, whole grains, and fresh fruits and vegetables. Limit foods that are high in fat and processed sugars, such as fried or sweet foods. Activity Exercise only as directed by your health care provider. Most people can continue their usual exercise routine during pregnancy. Try to exercise for 30 minutes at least 5 days a week. Stop exercising if you develop pain or cramping in the lower abdomen or lower back. Avoid exercising if it is very hot or humid or if you are at high altitude. Avoid heavy lifting. If you choose to, you may have sex unless your health care provider tells you not to. Relieving pain and discomfort Wear a good support bra to relieve breast tenderness. Rest with your legs elevated if you have leg cramps or low back pain. If you develop bulging veins (varicose veins) in your legs: Wear support hose as told by your health care provider. Elevate your feet for 15 minutes, 3-4 times a day. Limit salt in your diet. Safety Wear your seat belt at all times when  driving or riding in a car. Talk with your health care provider if someone is verbally or physically abusive to you. Talk with your health care provider if you are feeling sad or have thoughts of hurting yourself. Lifestyle Do not use hot tubs, steam rooms, or saunas. Do not douche. Do not use tampons or scented sanitary pads. Do not use herbal remedies, alcohol, illegal drugs, or medicines that are not approved by your health care provider. Chemicals  in these products can harm your baby. Do not use any products that contain nicotine or tobacco, such as cigarettes, e-cigarettes, and chewing tobacco. If you need help quitting, ask your health care provider. Avoid cat litter boxes and soil used by cats. These carry germs that can cause birth defects in the baby and possibly loss of the unborn baby (fetus) by miscarriage or stillbirth. General instructions During routine prenatal visits in the first trimester, your health care provider will do a physical exam, perform necessary tests, and ask you how things are going. Keep all follow-up visits. This is important. Ask for help if you have counseling or nutritional needs during pregnancy. Your health care provider can offer advice or refer you to specialists for help with various needs. Schedule a dentist appointment. At home, brush your teeth with a soft toothbrush. Floss gently. Write down your questions. Take them to your prenatal visits. Where to find more information American Pregnancy Association: americanpregnancy.[place] and Gynecologists: PoolDevices.com.pt Office on [employer] Health: KeywordPortfolios.com.br Contact a health care provider if you have: Dizziness. A fever. Mild pelvic cramps, pelvic pressure, or nagging pain in the abdominal area. Nausea, vomiting, or diarrhea that lasts for 24 hours or longer. A bad-smelling vaginal discharge. Pain when you urinate. Known exposure to a contagious illness, such as chickenpox, measles, Zika virus, HIV, or hepatitis. Get help right away if you have: Spotting or bleeding from your vagina. Severe abdominal cramping or pain. Shortness of breath or chest pain. Any kind of trauma, such as from a fall or a car crash. New or increased pain, swelling, or redness in an arm or leg. Summary The first trimester of pregnancy starts on the first day of your last menstrual period until the end of week  12 (months 1 through 3). Eating 4 or 5 small meals a day rather than 3 large meals may help to relieve nausea and vomiting. Do not use any products that contain nicotine or tobacco, such as cigarettes, e-cigarettes, and chewing tobacco. If you need help quitting, ask your health care provider. Keep all follow-up visits. This is important. This information is not intended to replace advice given to you by your health care provider. Make sure you discuss any questions you have with your health care provider. Document Revised: 03/30/2020 Document Reviewed: 02/04/2020 Elsevier Patient Education  [place] of Pregnancy  The second trimester of pregnancy is from week 13 through week 27. This is months 4 through 6 of pregnancy. The second trimester is often a time when you feel your best. Your body has adjusted to being pregnant, and you begin to feel better physically. During the second trimester: Morning sickness has lessened or stopped completely. You may have more energy. You may have an increase in appetite. The second trimester is also a time when the unborn baby (fetus) is growing rapidly. At the end of the sixth month, the fetus may be up to 12 inches long and weigh about 1 pounds. You will likely  begin to feel the baby move (quickening) between 16 and 20 weeks of pregnancy. Body changes during your second trimester Your body continues to go through many changes during your second trimester. The changes vary and generally return to normal after the baby is born. Physical changes Your weight will continue to increase. You will notice your lower abdomen bulging out. You may begin to get stretch marks on your hips, abdomen, and breasts. Your breasts will continue to grow and to become tender. Dark spots or blotches (chloasma or mask of pregnancy) may develop on your face. A dark line from your belly button to the pubic area (linea nigra) may appear. You may have  changes in your hair. These can include thickening of your hair, rapid growth, and changes in texture. Some people also have hair loss during or after pregnancy, or hair that feels dry or thin. Health changes You may develop headaches. You may have heartburn. You may develop constipation. You may develop hemorrhoids or swollen, bulging veins (varicose veins). Your gums may bleed and may be sensitive to brushing and flossing. You may urinate more often because the fetus is pressing on your bladder. You may have back pain. This is caused by: Weight gain. Pregnancy hormones that are relaxing the joints in your pelvis. A shift in weight and the muscles that support your balance. Follow these instructions at home: Medicines Follow your health care provider's instructions regarding medicine use. Specific medicines may be either safe or unsafe to take during pregnancy. Do not take any medicines unless approved by your health care provider. Take a prenatal vitamin that contains at least 600 micrograms (mcg) of folic acid. Eating and drinking Eat a healthy diet that includes fresh fruits and vegetables, whole grains, good sources of protein such as meat, eggs, or tofu, and low-fat dairy products. Avoid raw meat and unpasteurized juice, milk, and cheese. These carry germs that can harm you and your baby. You may need to take these actions to prevent or treat constipation: Drink enough fluid to keep your urine pale yellow. Eat foods that are high in fiber, such as beans, whole grains, and fresh fruits and vegetables. Limit foods that are high in fat and processed sugars, such as fried or sweet foods. Activity Exercise only as directed by your health care provider. Most people can continue their usual exercise routine during pregnancy. Try to exercise for 30 minutes at least 5 days a week. Stop exercising if you develop contractions in your uterus. Stop exercising if you develop pain or cramping in the  lower abdomen or lower back. Avoid exercising if it is very hot or humid or if you are at a high altitude. Avoid heavy lifting. If you choose to, you may have sex unless your health care provider tells you not to. Relieving pain and discomfort Wear a supportive bra to prevent discomfort from breast tenderness. Take warm sitz baths to soothe any pain or discomfort caused by hemorrhoids. Use hemorrhoid cream if your health care provider approves. Rest with your legs raised (elevated) if you have leg cramps or low back pain. If you develop varicose veins: Wear support hose as told by your health care provider. Elevate your feet for 15 minutes, 3-4 times a day. Limit salt in your diet. Safety Wear your seat belt at all times when driving or riding in a car. Talk with your health care provider if someone is verbally or physically abusive to you. Lifestyle Do not use hot tubs, steam rooms,  or saunas. Do not douche. Do not use tampons or scented sanitary pads. Avoid cat litter boxes and soil used by cats. These carry germs that can cause birth defects in the baby and possibly loss of the fetus by miscarriage or stillbirth. Do not use herbal remedies, alcohol, illegal drugs, or medicines that are not approved by your health care provider. Chemicals in these products can harm your baby. Do not use any products that contain nicotine or tobacco, such as cigarettes, e-cigarettes, and chewing tobacco. If you need help quitting, ask your health care provider. General instructions During a routine prenatal visit, your health care provider will do a physical exam and other tests. He or she will also discuss your overall health. Keep all follow-up visits. This is important. Ask your health care provider for a referral to a local prenatal education class. Ask for help if you have counseling or nutritional needs during pregnancy. Your health care provider can offer advice or refer you to specialists for help  with various needs. Where to find more information American Pregnancy Association: americanpregnancy.org American College of Obstetricians and Gynecologists: acog.org/en/Womens%20Health/Pregnancy Office on Women's Health: womenshealth.gov/pregnancy Contact a health care provider if you have: A headache that does not go away when you take medicine. Vision changes or you see spots in front of your eyes. Mild pelvic cramps, pelvic pressure, or nagging pain in the abdominal area. Persistent nausea, vomiting, or diarrhea. A bad-smelling vaginal discharge or foul-smelling urine. Pain when you urinate. Sudden or extreme swelling of your face, hands, ankles, feet, or legs. A fever. Get help right away if you: Have fluid leaking from your vagina. Have spotting or bleeding from your vagina. Have severe abdominal cramping or pain. Have difficulty breathing. Have chest pain. Have fainting spells. Have not felt your baby move for the time period told by your health care provider. Have new or increased pain, swelling, or redness in an arm or leg. Summary The second trimester of pregnancy is from week 13 through week 27 (months 4 through 6). Do not use herbal remedies, alcohol, illegal drugs, or medicines that are not approved by your health care provider. Chemicals in these products can harm your baby. Exercise only as directed by your health care provider. Most people can continue their usual exercise routine during pregnancy. Keep all follow-up visits. This is important. This information is not intended to replace advice given to you by your health care provider. Make sure you discuss any questions you have with your health care provider. Document Revised: 03/30/2020 Document Reviewed: 02/04/2020 Elsevier Patient Education  2023 Elsevier Inc.  

## 2022-11-22 LAB — PROTEIN / CREATININE RATIO, URINE
Creatinine, Urine: 128.1 mg/dL
Protein, Ur: 10.3 mg/dL
Protein/Creat Ratio: 80 mg/g creat (ref 0–200)

## 2022-11-22 LAB — CERVICOVAGINAL ANCILLARY ONLY
Chlamydia: NEGATIVE
Comment: NEGATIVE
Comment: NORMAL
Neisseria Gonorrhea: NEGATIVE

## 2022-11-23 LAB — URINE CULTURE, OB REFLEX

## 2022-11-23 LAB — CULTURE, OB URINE

## 2022-11-26 LAB — CYTOLOGY - PAP
Comment: NEGATIVE
Diagnosis: NEGATIVE
High risk HPV: NEGATIVE

## 2022-11-30 LAB — PANORAMA PRENATAL TEST FULL PANEL:PANORAMA TEST PLUS 5 ADDITIONAL MICRODELETIONS: FETAL FRACTION: 7.5

## 2022-12-04 ENCOUNTER — Other Ambulatory Visit: Payer: Self-pay | Admitting: *Deleted

## 2022-12-04 DIAGNOSIS — O0990 Supervision of high risk pregnancy, unspecified, unspecified trimester: Secondary | ICD-10-CM

## 2022-12-04 LAB — HORIZON CUSTOM

## 2022-12-04 NOTE — Progress Notes (Signed)
Reorder for doula services.

## 2022-12-05 ENCOUNTER — Ambulatory Visit: Payer: Medicaid Other | Attending: Cardiology | Admitting: Cardiology

## 2022-12-05 ENCOUNTER — Encounter: Payer: Self-pay | Admitting: Cardiology

## 2022-12-05 VITALS — BP 136/94 | HR 88 | Ht 60.0 in | Wt 196.2 lb

## 2022-12-05 DIAGNOSIS — I701 Atherosclerosis of renal artery: Secondary | ICD-10-CM

## 2022-12-05 DIAGNOSIS — O0990 Supervision of high risk pregnancy, unspecified, unspecified trimester: Secondary | ICD-10-CM

## 2022-12-05 DIAGNOSIS — I10 Essential (primary) hypertension: Secondary | ICD-10-CM | POA: Diagnosis not present

## 2022-12-05 DIAGNOSIS — R0609 Other forms of dyspnea: Secondary | ICD-10-CM

## 2022-12-05 DIAGNOSIS — Z3A12 12 weeks gestation of pregnancy: Secondary | ICD-10-CM

## 2022-12-05 MED ORDER — NIFEDIPINE ER OSMOTIC RELEASE 60 MG PO TB24: 60.0000 mg | ORAL_TABLET | Freq: Every day | ORAL | 3 refills | Status: DC

## 2022-12-05 NOTE — Patient Instructions (Addendum)
Medication Instructions:  Your physician has recommended you make the following change in your medication: INCREASE: Nifedipine 60 mg daily Please take your blood pressure daily for 2 weeks and send in a MyChart message. Please include heart rates.   HOW TO TAKE YOUR BLOOD PRESSURE: Rest 5 minutes before taking your blood pressure. Don't smoke or drink caffeinated beverages for at least 30 minutes before. Take your blood pressure before (not after) you eat. Sit comfortably with your back supported and both feet on the floor (don't cross your legs). Elevate your arm to heart level on a table or a desk. Use the proper sized cuff. It should fit smoothly and snugly around your bare upper arm. There should be enough room to slip a fingertip under the cuff. The bottom edge of the cuff should be 1 inch above the crease of the elbow. Ideally, take 3 measurements at one sitting and record the average.  *If you need a refill on your cardiac medications before your next appointment, please call your pharmacy*   Lab Work: None If you have labs (blood work) drawn today and your tests are completely normal, you will receive your results only by: [place] (if you have MyChart) OR A paper copy in the mail If you have any lab test that is abnormal or we need to change your treatment, we will call you to review the results.   Testing/Procedures: Your physician has requested that you have an echocardiogram. Echocardiography is a painless test that uses sound waves to create images of your heart. It provides your doctor with information about the size and shape of your heart and how well your heart's chambers and valves are working. This procedure takes approximately one hour. There are no restrictions for this procedure. Please do NOT wear cologne, perfume, aftershave, or lotions (deodorant is allowed). Please arrive 15 minutes prior to your appointment time.  Your physician has requested that you  have a renal artery duplex. During this test, an ultrasound is used to evaluate blood flow to the kidneys. Allow one hour for this exam. Do not eat after midnight the day before and avoid carbonated beverages. Take your medications as you usually do.    Follow-Up: At [facility], you and your health needs are our priority.  As part of our continuing mission to provide you with exceptional heart care, we have created designated Provider Care Teams.  These Care Teams include your primary Cardiologist (physician) and Advanced Practice Providers (APPs -  Physician Assistants and Nurse Practitioners) who all work together to provide you with the care you need, when you need it.  We recommend signing up for the patient portal called "MyChart".  Sign up information is provided on this After Visit Summary.  MyChart is used to connect with patients for Virtual Visits (Telemedicine).  Patients are able to view lab/test results, encounter notes, upcoming appointments, etc.  Non-urgent messages can be sent to your provider as well.   To learn more about what you can do with MyChart, go to [email or web address].    Your next appointment:   12 week(s)  Provider:   Berniece Salines, DO     Other instructions: [email or web address]        FDA-cleared, clinical grade mobile EKG monitor: Jodelle Red is the most clinically-validated mobile EKG used by the world's leading cardiac care medical professionals With Basic service, know instantly if your heart rhythm is normal or if atrial fibrillation is detected, and email the last single  EKG recording to yourself or your doctor Premium service, available for purchase through the [employer] app for $9.99 per month or $99 per year, includes unlimited history and storage of your EKG recordings, a monthly EKG summary report to share with your doctor, along with the ability to track your blood pressure, activity and weight Includes  one KardiaMobile phone clip FREE SHIPPING: Standard delivery 1-3 business days. Orders placed by 11:00am PST will ship that afternoon. Otherwise, will ship next business day. All orders ship via [employer] from [place], [place] - sending an EKG Download app and set up profile. Run EKG - by placing 1-2 fingers on the silver plates After EKG is complete - Download PDF  - Skip password (if you apply a password the provider will need it to view the EKG) Click share button (square with upward arrow) in bottom left corner To send: choose MyChart (first time log into MyChart)  Pop up window about sending ECG Click continue Choose type of message Choose provider Type subject and message Click send (EKG should be attached)  - To send additional EKGs in one message click the paperclip image and bottom of page to attach.

## 2022-12-05 NOTE — Progress Notes (Signed)
[facility]io[facility]Obstetrics Clinic  New Evaluation  Date:  12/05/2022   ID:  Dawn Hoffman, DOB [birth year], MRN [id number]  PCP:  Patient, No Pcp Per   [place] [facility] Cardiologist:  Berniece Salines, DO  Electrophysiologist:  None       Referring MD: Mora Bellman, MD   Chief Complaint: " I am doing well"  History of Present Illness:    Dawn Hoffman is a 32 y.o. female [G3P0110] who is being seen today for the evaluation of chronic hypertension in pregnancy at the request of Constant, Peggy, MD.   Medical history includes chronic hypertension in pregnancy, anxiety, here today to be evaluated for high blood pressure in pregnancy.  She tells me that she was diagnosed with hypertension at the age of 48.  She tells me as a child she did see cardiology.  But the thing for her blood pressure was not really known.  None of her parents have high blood pressure she tells me.  In terms of her gynecologic history she notes that she has had 2 previous pregnancy the first ended in a spontaneous/miscarriage at week 5-6, the second delivery at 55 weeks with a baby boy at home she lost 1 week later.  She notes that recently she has had episodes of shortness of breath on exertion.  She notes that is progressing to limited activity.  She is concerned about this.  She is also had an episode where she had abrupt heartbeat which lasted for few minutes and then resolved.  She has not passed out.  No chest pain.  Prior CV Studies Reviewed: The following studies were reviewed today:   Past Medical History:  Diagnosis Date   Anemia    Anxiety    Asthma    Dyspnea    Headache    Hypertension     Past Surgical History:  Procedure Laterality Date   CESAREAN SECTION N/A 12/08/2018   Procedure: CESAREAN SECTION;  Surgeon: Chancy Milroy, MD;  Location: [place];  Service: Obstetrics;  Laterality: N/A;      OB History     Gravida  3   Para  1   Term      Preterm   1   AB  1   Living  0      SAB  1   IAB      Ectopic      Multiple  0   Live Births  1               Current Medications: Current Meds  Medication Sig   aspirin EC 81 MG tablet Take 1 tablet (81 mg total) by mouth daily. Take after 12 weeks for prevention of preeclampsia later in pregnancy   Blood Pressure Monitoring (BLOOD PRESSURE KIT) DEVI 1 Device by Does not apply route once a week.   fexofenadine (ALLEGRA) 180 MG tablet Take 180 mg by mouth daily.   loratadine (CLARITIN) 10 MG tablet Take 1 tablet (10 mg total) by mouth daily.   montelukast (SINGULAIR) 10 MG tablet Take 10 mg by mouth daily.   NIFEdipine (PROCARDIA XL/NIFEDICAL XL) 60 MG 24 hr tablet Take 1 tablet (60 mg total) by mouth daily.   Prenatal Vit-Fe Fumarate-FA (PRENATAL VITAMIN PO) Take 1 tablet by mouth daily.   [DISCONTINUED] NIFEdipine (ADALAT CC) 30 MG 24 hr tablet Take 30 mg by mouth daily.     Allergies:   Patient has no known allergies.   Social  History   Socioeconomic History   Marital status: Single    Spouse name: Not on file   Number of children: Not on file   Years of education: Not on file   Highest education level: Not on file  Occupational History   Occupation: Product manager: [employer]  Tobacco Use   Smoking status: Never   Smokeless tobacco: Never  Vaping Use   Vaping Use: Never used  Substance and Sexual Activity   Alcohol use: No   Drug use: No   Sexual activity: Yes    Birth control/protection: None  Other Topics Concern   Not on file  Social History Narrative   Not on file   Social Determinants of Health   Financial Resource Strain: Low Risk  (09/18/2018)   Overall Financial Resource Strain (CARDIA)    Difficulty of Paying Living Expenses: Not hard at all  Food Insecurity: Unknown (09/18/2018)   Hunger Vital Sign    Worried About Running Out of Food in the Last Year: Never true    [place] in the Last Year: Not on file   Transportation Needs: No Transportation Needs (09/18/2018)   PRAPARE - Hydrologist (Medical): No    Lack of Transportation (Non-Medical): No  Physical Activity: Not on file  Stress: Not on file  Social Connections: Not on file      Family History  Problem Relation Age of Onset   Cancer Maternal Grandmother    Diabetes Maternal Grandfather    Alcohol abuse Father    Drug abuse Father    Anemia Mother       ROS:   Please see the history of present illness.     All other systems reviewed and are negative.   Labs/EKG Reviewed:    EKG:   EKG is was ordered today.  The ekg ordered today demonstrates sinus rhythm.  Recent Labs: 11/08/2022: ALT 11; BUN 6; Creatinine, Ser 0.57; Hemoglobin 12.2; Platelets 242; Potassium 3.9; Sodium 137   Recent Lipid Panel No results found for: "CHOL", "TRIG", "HDL", "CHOLHDL", "LDLCALC", "LDLDIRECT"  Physical Exam:    VS:  BP (!) 136/94   Pulse 88   Ht 5' (1.524 m)   Wt 89 kg   LMP 09/05/2022   SpO2 93%   BMI 38.32 kg/m     Wt Readings from Last 3 Encounters:  12/05/22 89 kg  11/21/22 88 kg  11/08/22 87 kg     GEN:  Well nourished, well developed in no acute distress HEENT: Normal NECK: No JVD; No carotid bruits LYMPHATICS: No lymphadenopathy CARDIAC: RRR, no murmurs, rubs, gallops RESPIRATORY:  Clear to auscultation without rales, wheezing or rhonchi  ABDOMEN: Soft, non-tender, non-distended MUSCULOSKELETAL: Trace lower extremity bilateral edema; No deformity  SKIN: Warm and dry NEUROLOGIC:  Alert and oriented x 3 PSYCHIATRIC:  Normal affect    Risk Assessment/Risk Calculators:                 ASSESSMENT & PLAN:    Chronic hypertension in pregnancy Dyspnea on exertion Leg swelling Obesity in pregnancy.   Blood pressure is slightly elevated in the office today.  She is on nifedipine 30 mg daily I will go ahead and increase that to 60 mg daily.  Her shortness of breath which she  is progressing is concerning given her early pregnancy as well as history of longstanding hypertension I would like to get an echocardiogram to assess for  any structural abnormalities.  Will also get renal ultrasound to rule out renal artery stenosis in this young patient who was diagnosed with hypertension at a very young age.  The patient understands the need to lose weight with diet and exercise. We have discussed specific strategies for this.  The patient is in agreement with the above plan. The patient left the office in stable condition.  The patient will follow up in  Patient Instructions  Medication Instructions:  Your physician has recommended you make the following change in your medication: INCREASE: Nifedipine 60 mg daily Please take your blood pressure daily for 2 weeks and send in a MyChart message. Please include heart rates.   HOW TO TAKE YOUR BLOOD PRESSURE: Rest 5 minutes before taking your blood pressure. Don't smoke or drink caffeinated beverages for at least 30 minutes before. Take your blood pressure before (not after) you eat. Sit comfortably with your back supported and both feet on the floor (don't cross your legs). Elevate your arm to heart level on a table or a desk. Use the proper sized cuff. It should fit smoothly and snugly around your bare upper arm. There should be enough room to slip a fingertip under the cuff. The bottom edge of the cuff should be 1 inch above the crease of the elbow. Ideally, take 3 measurements at one sitting and record the average.  *If you need a refill on your cardiac medications before your next appointment, please call your pharmacy*   Lab Work: None If you have labs (blood work) drawn today and your tests are completely normal, you will receive your results only by: MyChart Message (if you have MyChart) OR A paper copy in the mail If you have any lab test that is abnormal or we need to change your treatment, we will call you to  review the results.   Testing/Procedures: Your physician has requested that you have an echocardiogram. Echocardiography is a painless test that uses sound waves to create images of your heart. It provides your doctor with information about the size and shape of your heart and how well your heart's chambers and valves are working. This procedure takes approximately one hour. There are no restrictions for this procedure. Please do NOT wear cologne, perfume, aftershave, or lotions (deodorant is allowed). Please arrive 15 minutes prior to your appointment time.  Your physician has requested that you have a renal artery duplex. During this test, an ultrasound is used to evaluate blood flow to the kidneys. Allow one hour for this exam. Do not eat after midnight the day before and avoid carbonated beverages. Take your medications as you usually do.    Follow-Up: At [facility] you and your health needs are our priority.  As part of our continuing mission to provide you with exceptional heart care, we have created designated Provider Care Teams.  These Care Teams include your primary Cardiologist (physician) and Advanced Practice Providers (APPs -  Physician Assistants and Nurse Practitioners) who all work together to provide you with the care you need, when you need it.  We recommend signing up for the patient portal called "MyChart".  Sign up information is provided on this After Visit Summary.  MyChart is used to connect with patients for Virtual Visits (Telemedicine).  Patients are able to view lab/test results, encounter notes, upcoming appointments, etc.  Non-urgent messages can be sent to your provider as well.   To learn more about what you can do with MyChart, go  to [email or web address].    Your next appointment:   12 week(s)  Provider:   Berniece Salines, DO     Other instructions: [email or web address]        FDA-cleared, clinical  grade mobile EKG monitor: Jodelle Red is the most clinically-validated mobile EKG used by the world's leading cardiac care medical professionals With Basic service, know instantly if your heart rhythm is normal or if atrial fibrillation is detected, and email the last single EKG recording to yourself or your doctor Premium service, available for purchase through the K[employer]ia app for $9.99 per month or $99 per year, includes unlimited history and storage of your EKG recordings, a monthly EKG summary report to share with your doctor, along with the ability to track your blood pressure, activity and weight Includes one K[employer]iaMobile phone clip FREE SHIPPING: Standard delivery 1-3 business days. Orders placed by 11:00am PST will ship that afternoon. Otherwise, will ship next business day. All orders ship via [employer] from [place], [place] - sending an EKG Download app and set up profile. Run EKG - by placing 1-2 fingers on the silver plates After EKG is complete - Download PDF  - Skip password (if you apply a password the provider will need it to view the EKG) Click share button (square with upward arrow) in bottom left corner To send: choose MyChart (first time log into MyChart)  Pop up window about sending ECG Click continue Choose type of message Choose provider Type subject and message Click send (EKG should be attached)  - To send additional EKGs in one message click the paperclip image and bottom of page to attach.        Dispo:  No follow-ups on file.   Medication Adjustments/Labs and Tests Ordered: Current medicines are reviewed at length with the patient today.  Concerns regarding medicines are outlined above.  Tests Ordered: Orders Placed This Encounter  Procedures   EKG 12-Lead   ECHOCARDIOGRAM COMPLETE   VAS US RENAL ARTERY DUPLEX   Medication Changes: Meds ordered this encounter  Medications   NIFEdipine (PROCARDIA XL/NIFEDICAL XL) 60 MG 24 hr tablet    Sig:  Take 1 tablet (60 mg total) by mouth daily.    Dispense:  90 tablet    Refill:  3

## 2022-12-10 ENCOUNTER — Telehealth: Payer: Self-pay | Admitting: Cardiology

## 2022-12-10 MED ORDER — LABETALOL HCL 100 MG PO TABS: 50.0000 mg | ORAL_TABLET | Freq: Two times a day (BID) | ORAL | 1 refills | Status: DC

## 2022-12-10 NOTE — Telephone Encounter (Signed)
Patient stated she does not like taking nifedipine because it makes her heart race. She decided to take her dose at bedtime to see if it made a difference, but she woke during the night with her heart racing. She did not take her dose yesterday or today and feels fine; no heart racing episodes. She went to PCP this AM and was DX with sinus infection and prescribed amoxicillin. Her BP at PCP 118/78, P 63. She wants to know if there is another medication she should take or just monitor her BP. Please advise.

## 2022-12-10 NOTE — Telephone Encounter (Signed)
Patient informed to stop taking nifedipine and to start taking labetalol 50mg  twice daily by mouth. Patient asked the reason for the echo and renal ultrasound. Read to her from last OV the notes per Dr. Harriet Masson. She will continue to monitor BP. Recommended that she have someone drive her around today to her appointments due to starting new medication. She verbalized understanding of this message.

## 2022-12-10 NOTE — Telephone Encounter (Signed)
Spoke with patient who is at another appointment and requested a call back in an hour.

## 2022-12-10 NOTE — Addendum Note (Signed)
Addended by: Betha Loa F on: 12/10/2022 10:40 AM   Modules accepted: Orders

## 2022-12-10 NOTE — Telephone Encounter (Signed)
Pt c/o medication issue:  1. Name of Medication:   NIFEdipine (PROCARDIA XL/NIFEDICAL XL) 60 MG 24 hr tablet    2. How are you currently taking this medication (dosage and times per day)? Not currently taking anymore  3. Are you having a reaction (difficulty breathing--STAT)? no  4. What is your medication issue? Patient states her medication was increased from 30 mg to 60 mg and it has not helped. She says she had told Dr. Harriet Masson that her heart would race while on the 30 mg dose. She says her HR on the 60 mg went up to 105. She says she did not take it yesterday because her heart races too much when she takes it. She says if her heart races and she feels like fainted every time she takes it then "what's the point". She says she does not feel like fainting today. She requests a call back as soon as possible.

## 2022-12-17 ENCOUNTER — Ambulatory Visit (INDEPENDENT_AMBULATORY_CARE_PROVIDER_SITE_OTHER): Payer: Medicaid Other | Admitting: Obstetrics and Gynecology

## 2022-12-17 ENCOUNTER — Encounter: Payer: Self-pay | Admitting: Obstetrics and Gynecology

## 2022-12-17 VITALS — BP 134/85 | HR 75 | Wt 200.0 lb

## 2022-12-17 DIAGNOSIS — O0992 Supervision of high risk pregnancy, unspecified, second trimester: Secondary | ICD-10-CM

## 2022-12-17 DIAGNOSIS — O99212 Obesity complicating pregnancy, second trimester: Secondary | ICD-10-CM

## 2022-12-17 DIAGNOSIS — O0990 Supervision of high risk pregnancy, unspecified, unspecified trimester: Secondary | ICD-10-CM

## 2022-12-17 DIAGNOSIS — O162 Unspecified maternal hypertension, second trimester: Secondary | ICD-10-CM

## 2022-12-17 DIAGNOSIS — O34219 Maternal care for unspecified type scar from previous cesarean delivery: Secondary | ICD-10-CM

## 2022-12-17 DIAGNOSIS — O99210 Obesity complicating pregnancy, unspecified trimester: Secondary | ICD-10-CM

## 2022-12-17 DIAGNOSIS — O169 Unspecified maternal hypertension, unspecified trimester: Secondary | ICD-10-CM

## 2022-12-17 DIAGNOSIS — Z3A14 14 weeks gestation of pregnancy: Secondary | ICD-10-CM

## 2022-12-17 NOTE — Progress Notes (Signed)
   PRENATAL VISIT NOTE  Subjective:  Dawn Hoffman is a 32 y.o. G3P0110 at [redacted]w[redacted]d being seen today for ongoing prenatal care.  She is currently monitored for the following issues for this high-risk pregnancy and has MIGRAINE, UNSPEC., W/O INTRACTABLE MIGRAINE; HYPERTENSION, BENIGN SYSTEMIC; RHINITIS, ALLERGIC; ASTHMA, UNSPECIFIED; Abnormal fetal ultrasound; Supervision of high risk pregnancy, antepartum; Hypertension affecting pregnancy, antepartum; Previous cesarean delivery affecting pregnancy, antepartum; and Obesity affecting pregnancy, antepartum on their problem list.  Patient reports no complaints.  Contractions: Not present. Vag. Bleeding: None.   . Denies leaking of fluid.   The following portions of the patient's history were reviewed and updated as appropriate: allergies, current medications, past family history, past medical history, past social history, past surgical history and problem list.   Objective:   Vitals:   12/17/22 1603  BP: 134/85  Pulse: 75  Weight: 200 lb (90.7 kg)    Fetal Status: Fetal Heart Rate (bpm): 153         General:  Alert, oriented and cooperative. Patient is in no acute distress.  Skin: Skin is warm and dry. No rash noted.   Cardiovascular: Normal heart rate noted  Respiratory: Normal respiratory effort, no problems with respiration noted  Abdomen: Soft, gravid, appropriate for gestational age.  Pain/Pressure: Absent     Pelvic: Cervical exam deferred        Extremities: Normal range of motion.  Edema: Trace  Mental Status: Normal mood and affect. Normal behavior. Normal judgment and thought content.   Assessment and Plan:  Pregnancy: G3P0110 at [redacted]w[redacted]d 1. Supervision of high risk pregnancy, antepartum Patient is doing well without complaints AFP next visit Anatomy ultrasound scheduled next month  2. Hypertension affecting pregnancy, antepartum Continue ASA Continue labetalol Followed by Cards ob Maternal echo scheduled  3.  Previous cesarean delivery affecting pregnancy, antepartum Will discuss mode of delivery at a later visit  4. Obesity affecting pregnancy, antepartum, unspecified obesity type   Preterm labor symptoms and general obstetric precautions including but not limited to vaginal bleeding, contractions, leaking of fluid and fetal movement were reviewed in detail with the patient. Please refer to After Visit Summary for other counseling recommendations.   Return in about 4 weeks (around 01/14/2023) for in person, ROB, High risk.  Future Appointments  Date Time Provider [place]  12/20/2022  9:00 AM [facility] NL VASC 4 [facility]SECVI [facility]  12/27/2022  2:05 PM [facility] CH ECHO 5 [facility]  01/16/2023 10:15 AM [facility] NURSE [facility]  01/16/2023 10:30 AM [facility] US3 [facility]  03/15/2023  8:20 AM Tobb, Godfrey Pick, DO [facility] None    Mora Bellman, MD

## 2022-12-20 ENCOUNTER — Ambulatory Visit (HOSPITAL_COMMUNITY)
Admission: RE | Admit: 2022-12-20 | Discharge: 2022-12-20 | Disposition: A | Discharge status: Home / Self Care | Payer: Medicaid Other | Source: Ambulatory Visit | Attending: Cardiology | Admitting: Cardiology

## 2022-12-20 DIAGNOSIS — I701 Atherosclerosis of renal artery: Secondary | ICD-10-CM | POA: Diagnosis present

## 2022-12-27 ENCOUNTER — Ambulatory Visit (HOSPITAL_COMMUNITY): Payer: Medicaid Other | Attending: Cardiology

## 2022-12-27 DIAGNOSIS — I10 Essential (primary) hypertension: Secondary | ICD-10-CM | POA: Insufficient documentation

## 2022-12-27 DIAGNOSIS — R0609 Other forms of dyspnea: Secondary | ICD-10-CM | POA: Diagnosis present

## 2022-12-27 LAB — ECHOCARDIOGRAM COMPLETE
Area-P 1/2: 3.68 cm2
S' Lateral: 2.3 cm

## 2023-01-01 ENCOUNTER — Ambulatory Visit: Payer: Medicaid Other | Attending: Cardiology | Admitting: Cardiology

## 2023-01-01 ENCOUNTER — Telehealth: Payer: Self-pay

## 2023-01-01 VITALS — BP 139/88 | HR 83 | Ht 60.0 in | Wt 200.0 lb

## 2023-01-01 DIAGNOSIS — O10919 Unspecified pre-existing hypertension complicating pregnancy, unspecified trimester: Secondary | ICD-10-CM

## 2023-01-01 DIAGNOSIS — R0609 Other forms of dyspnea: Secondary | ICD-10-CM | POA: Diagnosis not present

## 2023-01-01 DIAGNOSIS — I701 Atherosclerosis of renal artery: Secondary | ICD-10-CM

## 2023-01-01 DIAGNOSIS — O99210 Obesity complicating pregnancy, unspecified trimester: Secondary | ICD-10-CM

## 2023-01-01 DIAGNOSIS — Z3A29 29 weeks gestation of pregnancy: Secondary | ICD-10-CM

## 2023-01-01 NOTE — Patient Instructions (Signed)
Medication Instructions:  Your physician recommends that you continue on your current medications as directed. Please refer to the Current Medication list given to you today.  Please take your blood pressure daily for 2 weeks and send in a MyChart message. Please include heart rates.   HOW TO TAKE YOUR BLOOD PRESSURE: Rest 5 minutes before taking your blood pressure. Don't smoke or drink caffeinated beverages for at least 30 minutes before. Take your blood pressure before (not after) you eat. Sit comfortably with your back supported and both feet on the floor (don't cross your legs). Elevate your arm to heart level on a table or a desk. Use the proper sized cuff. It should fit smoothly and snugly around your bare upper arm. There should be enough room to slip a fingertip under the cuff. The bottom edge of the cuff should be 1 inch above the crease of the elbow. Ideally, take 3 measurements at one sitting and record the average. *If you need a refill on your cardiac medications before your next appointment, please call your pharmacy*   Lab Work: None  Testing/Procedures: None   Follow-Up: At [facility], you and your health needs are our priority.  As part of our continuing mission to provide you with exceptional heart care, we have created designated Provider Care Teams.  These Care Teams include your primary Cardiologist (physician) and Advanced Practice Providers (APPs -  Physician Assistants and Nurse Practitioners) who all work together to provide you with the care you need, when you need it.  We recommend signing up for the patient portal called "MyChart".  Sign up information is provided on this After Visit Summary.  MyChart is used to connect with patients for Virtual Visits (Telemedicine).  Patients are able to view lab/test results, encounter notes, upcoming appointments, etc.  Non-urgent messages can be sent to your provider as well.   To learn more about what you can do  with MyChart, go to [email or web address].    Your next appointment:   8 week(s)  Provider:   Berniece Salines, DO

## 2023-01-01 NOTE — Telephone Encounter (Signed)
Called pt to go over after visit summary. Pt made aware Dr. Harriet Masson wants her to take her blood pressure daily for 2 weeks than send to [place]. Pt is agreeable. She will fax them.

## 2023-01-01 NOTE — Progress Notes (Signed)
Cardio-Obstetrics Clinic  New Evaluation  Date:  01/02/2023   ID:  Dawn Hoffman, DOB 1991/08/08, MRN [id number]  PCP:  Patient, No Pcp Per   [facility] Providers Cardiologist:  Thomasene Ripple, DO  Electrophysiologist:  None       Referring MD: No ref. provider found   Chief Complaint: " I am doing well"  Patient is at home. I am in the office.   Virtual Visit via telephone  Note . I connected with the patient today by a telephone enabled telemedicine application and verified that I am speaking with the correct person using two identifiers.  History of Present Illness:    Dawn Hoffman is a 32 y.o. female [G3P0110] who is being seen today for the evaluation of chronic hypertension in pregnancy at the request of No ref. provider found.   Medical history includes chronic hypertension in pregnancy, anxiety, here today to be evaluated for high blood pressure in pregnancy.  She tells me that she was diagnosed with hypertension at the age of 65.  She tells me as a child she did see cardiology.  But the thing for her blood pressure was not really known.  None of her parents have high blood pressure she tells me.  In terms of her gynecologic history she notes that she has had 2 previous pregnancy the first ended in a spontaneous/miscarriage at week 5-6, the second delivery at 100 weeks with a baby boy at home she lost 1 week later.  She notes that recently she has had episodes of shortness of breath on exertion.  She notes that is progressing to limited activity.  She is concerned about this.  She is also had an episode where she had abrupt heartbeat which lasted for few minutes and then resolved.  She has not passed out.  No chest pain.  Prior CV Studies Reviewed: The following studies were reviewed today:  TTE 12/2022 IMPRESSIONS     1. Left ventricular ejection fraction, by estimation, is 60 to 65%. The  left ventricle has normal function. The left ventricle  has no regional  wall motion abnormalities. Left ventricular diastolic parameters were  normal. The average left ventricular  global longitudinal strain is -22.0 %. The global longitudinal strain is  normal.   2. Right ventricular systolic function is normal. The right ventricular  size is normal.   3. The mitral valve is normal in structure. Trivial mitral valve  regurgitation. No evidence of mitral stenosis.   4. The aortic valve is normal in structure. Aortic valve regurgitation is  not visualized. No aortic stenosis is present.   5. The inferior vena cava is normal in size with greater than 50%  respiratory variability, suggesting right atrial pressure of 3 mmHg.   Comparison(s): No prior Echocardiogram.   Conclusion(s)/Recommendation(s): Normal biventricular function without  evidence of hemodynamically significant valvular heart disease.   FINDINGS   Left Ventricle: Left ventricular ejection fraction, by estimation, is 60  to 65%. The left ventricle has normal function. The left ventricle has no  regional wall motion abnormalities. The average left ventricular global  longitudinal strain is -22.0 %.  The global longitudinal strain is normal. The left ventricular internal  cavity size was normal in size. There is no left ventricular hypertrophy.  Left ventricular diastolic parameters were normal.   Right Ventricle: The right ventricular size is normal. Right ventricular  systolic function is normal.   Left Atrium: Left atrial size was normal in size.  Right Atrium: Right atrial size was normal in size.   Pericardium: There is no evidence of pericardial effusion.   Mitral Valve: The mitral valve is normal in structure. Trivial mitral  valve regurgitation. No evidence of mitral valve stenosis.   Tricuspid Valve: The tricuspid valve is normal in structure. Tricuspid  valve regurgitation is trivial. No evidence of tricuspid stenosis.   Aortic Valve: The aortic valve is  normal in structure. Aortic valve  regurgitation is not visualized. No aortic stenosis is present.   Pulmonic Valve: The pulmonic valve was normal in structure. Pulmonic valve  regurgitation is trivial. No evidence of pulmonic stenosis.   Aorta: The aortic root is normal in size and structure.   Venous: The inferior vena cava is normal in size with greater than 50%  respiratory variability, suggesting right atrial pressure of 3 mmHg.   IAS/Shunts: No atrial level shunt detected by color flow Doppler.   Doppler US 12/20/2022 Examination Guidelines: A complete evaluation includes B-mode imaging,  spectral  Doppler, color Doppler, and power Doppler as needed of all accessible  portions  of each vessel. Bilateral testing is considered an integral part of a  complete  examination. Limited examinations for reoccurring indications may be  performed  as noted.     Duplex Findings:  +--------------------+--------+--------+------+----------------------+  Mesenteric         PSV cm/sEDV cm/sPlaque       Comments         +--------------------+--------+--------+------+----------------------+  Aorta Prox            120                 1.7 cm AP x 1.8 cm TRV  +--------------------+--------+--------+------+----------------------+  Aorta Distal           96                                         +--------------------+--------+--------+------+----------------------+  Celiac Artery Origin  131                                         +--------------------+--------+--------+------+----------------------+  SMA Origin            138      18                                 +--------------------+--------+--------+------+----------------------+           +------------------+--------+--------+-------+  Right Renal ArteryPSV cm/sEDV cm/sComment  +------------------+--------+--------+-------+  Origin              95      29             +------------------+--------+--------+-------+  Proximal           181      60            +------------------+--------+--------+-------+  Mid                210      67            +------------------+--------+--------+-------+  Distal             132      39            +------------------+--------+--------+-------+   +-----------------+--------+--------+-------+  Left Renal ArteryPSV cm/sEDV cm/sComment  +-----------------+--------+--------+-------+  Origin            112      37            +-----------------+--------+--------+-------+  Proximal          172      58            +-----------------+--------+--------+-------+  Mid               183      61            +-----------------+--------+--------+-------+  Distal            192      60            +-----------------+--------+--------+-------+     Technologist observations: Incidental findings: Midline epigastric fluid  filled bowel with thickened wall, measuring .48 cm, ? etiology.   +------------+--------+--------+----+-----------+--------+--------+----+  Right KidneyPSV cm/sEDV cm/sRI  Left KidneyPSV cm/sEDV cm/sRI    +------------+--------+--------+----+-----------+--------+--------+----+  Upper Pole  34      11      0.66Upper Pole 39      14      0.65  +------------+--------+--------+----+-----------+--------+--------+----+  Mid        55      19      0.        23      9       0.59  +------------+--------+--------+----+-----------+--------+--------+----+  Lower Pole  22      8       0.64Lower Pole 21      7       0.69  +------------+--------+--------+----+-----------+--------+--------+----+  Hilar      78      29      0.62Hilar      93      32      0.66  +------------+--------+--------+----+-----------+--------+--------+----+   +------------------+-------+------------------+-------+  Right Kidney             Left Kidney                 +------------------+-------+------------------+-------+  RAR                     RAR                        +------------------+-------+------------------+-------+  RAR (manual)      1.75   RAR (manual)      1.60     +------------------+-------+------------------+-------+  Cortex           1.02 cmCortex            1.01 cm  +------------------+-------+------------------+-------+  Cortex thickness         Corex thickness            +------------------+-------+------------------+-------+  Kidney length (cm)10.77  Kidney length (cm)11.10    +------------------+-------+------------------+-------+     Summary:  Largest Aortic Diameter: 1.8 cm    Renal:    Right: Normal size right kidney. Normal right Resistive Index.         Normal cortical thickness of right kidney. 1-59% stenosis of         the right renal artery. RRV flow present.  Left:  Normal size of left kidney. Normal left Resistive Index.         Normal cortical thickness of the left kidney. 1-59% stenosis         of the  left renal artery. LRV flow present.  Mesenteric:  Normal Celiac artery and Superior Mesenteric artery findings.    Patent IVC.    Incidental findings: Midline epigastric fluid filled bowel with thickened  wall,  measuring .48 cm,  ? etiology.    Past Medical History:  Diagnosis Date   Anemia    Anxiety    Asthma    Dyspnea    Headache    Hypertension     Past Surgical History:  Procedure Laterality Date   CESAREAN SECTION N/A 12/08/2018   Procedure: CESAREAN SECTION;  Surgeon: Hermina Staggers, MD;  Location: [facility] BIRTHING SU[facility];  Service: Obstetrics;  Laterality: N/A;      OB History     Gravida  3   Para  1   Term      Preterm  1   AB  1   Living  0      SAB  1   IAB      Ectopic      Multiple  0   Live Births  1               Current Medications: Current Meds  Medication Sig   aspirin EC 81 MG tablet Take 1 tablet (81 mg total)  by mouth daily. Take after 12 weeks for prevention of preeclampsia later in pregnancy   Blood Pressure Monitoring (BLOOD PRESSURE KIT) DEVI 1 Device by Does not apply route once a week.   fexofenadine (ALLEGRA) 180 MG tablet Take 180 mg by mouth daily.   labetalol (NORMODYNE) 100 MG tablet Take 0.5 tablets (50 mg total) by mouth 2 (two) times daily.   montelukast (SINGULAIR) 10 MG tablet Take 10 mg by mouth as needed.     Allergies:   Patient has no known allergies.   Social History   Socioeconomic History   Marital status: Single    Spouse name: Not on file   Number of children: Not on file   Years of education: Not on file   Highest education level: Not on file  Occupational History   Occupation: Magazine features editor: [employer]  Tobacco Use   Smoking status: Never   Smokeless tobacco: Never  Vaping Use   Vaping Use: Never used  Substance and Sexual Activity   Alcohol use: No   Drug use: No   Sexual activity: Yes    Birth control/protection: None  Other Topics Concern   Not on file  Social History Narrative   Not on file   Social Determinants of Health   Financial Resource Strain: Low Risk  (09/18/2018)   Overall Financial Resource Strain (CARDIA)    Difficulty of Paying Living Expenses: Not hard at all  Food Insecurity: Unknown (09/18/2018)   Hunger Vital Sign    Worried About Running Out of Food in the Last Year: Never true    Ran Out of Food in the Last Year: Not on file  Transportation Needs: No Transportation Needs (09/18/2018)   PRAPARE - Administrator, Civil Service (Medical): No    Lack of Transportation (Non-Medical): No  Physical Activity: Not on file  Stress: Not on file  Social Connections: Not on file      Family History  Problem Relation Age of Onset   Cancer Maternal Grandmother    Diabetes Maternal Grandfather    Alcohol abuse Father    Drug abuse Father    Anemia Mother  ROS:   Please see the history of  present illness.     All other systems reviewed and are negative.   Labs/EKG Reviewed:    EKG:   EKG is was ordered today.  The ekg ordered today demonstrates sinus rhythm.  Recent Labs: 11/08/2022: ALT 11; BUN 6; Creatinine, Ser 0.57; Hemoglobin 12.2; Platelets 242; Potassium 3.9; Sodium 137   Recent Lipid Panel No results found for: "CHOL", "TRIG", "HDL", "CHOLHDL", "LDLCALC", "LDLDIRECT"  Physical Exam:    VS:  BP 139/88   Pulse 83   Ht 5' (1.524 m)   Wt 90.7 kg   LMP 09/05/2022   BMI 39.06 kg/m     Wt Readings from Last 3 Encounters:  01/01/23 90.7 kg  12/17/22 90.7 kg  12/05/22 89 kg     GEN:  Well nourished, well developed in no acute distress HEENT: Normal NECK: No JVD; No carotid bruits LYMPHATICS: No lymphadenopathy CARDIAC: RRR, no murmurs, rubs, gallops RESPIRATORY:  Clear to auscultation without rales, wheezing or rhonchi  ABDOMEN: Soft, non-tender, non-distended MUSCULOSKELETAL: Trace lower extremity bilateral edema; No deformity  SKIN: Warm and dry NEUROLOGIC:  Alert and oriented x 3 PSYCHIATRIC:  Normal affect    Risk Assessment/Risk Calculators:                 ASSESSMENT & PLAN:    Chronic hypertension in pregnancy Dyspnea on exertion Leg swelling Obesity in pregnancy. Renal Artery Stenosis  We discussed her echo an US renal artery ultrasound.  Echo was normal but renal artery ultrasound showed renal artery stenosis 1-59% bilaterally.  We will do annual renal ultrasound to follow the progression.  Started to progress I will refer the patient to interventional cardiology.  But for right now we will continue to monitor her blood pressure.  We talked about taking her blood pressure daily send me the information-she currently is on labetalol 50 mg twice daily.  Her blood pressure is elevated we will increase it to 100 mg twice daily and ideally optimize this to keep her blood pressure less than 140/90 during pregnancy.  We talked about the  epigastric fluid noted on her ultrasound-I have advised the patient that it would be beneficial postpartum to get a repeat ultrasound to assess this.  This can be done with her PCP.  She is agreeable for this.  The patient understands the need to lose weight with diet and exercise. We have discussed specific strategies for this.  The patient is in agreement with the above plan. The patient left the office in stable condition.  The patient will follow up in 8 weeks.  Time spent on the phone 13 minutes.  Patient Instructions  Medication Instructions:  Your physician recommends that you continue on your current medications as directed. Please refer to the Current Medication list given to you today.  Please take your blood pressure daily for 2 weeks and send in a MyChart message. Please include heart rates.   HOW TO TAKE YOUR BLOOD PRESSURE: Rest 5 minutes before taking your blood pressure. Don't smoke or drink caffeinated beverages for at least 30 minutes before. Take your blood pressure before (not after) you eat. Sit comfortably with your back supported and both feet on the floor (don't cross your legs). Elevate your arm to heart level on a table or a desk. Use the proper sized cuff. It should fit smoothly and snugly around your bare upper arm. There should be enough room to slip a fingertip under the cuff.  The bottom edge of the cuff should be 1 inch above the crease of the elbow. Ideally, take 3 measurements at one sitting and record the average. *If you need a refill on your cardiac medications before your next appointment, please call your pharmacy*   Lab Work: None  Testing/Procedures: None   Follow-Up: At [facility], you and your health needs are our priority.  As part of our continuing mission to provide you with exceptional heart care, we have created designated Provider Care Teams.  These Care Teams include your primary Cardiologist (physician) and Advanced Practice  Providers (APPs -  Physician Assistants and Nurse Practitioners) who all work together to provide you with the care you need, when you need it.  We recommend signing up for the patient portal called "MyChart".  Sign up information is provided on this After Visit Summary.  MyChart is used to connect with patients for Virtual Visits (Telemedicine).  Patients are able to view lab/test results, encounter notes, upcoming appointments, etc.  Non-urgent messages can be sent to your provider as well.   To learn more about what you can do with MyChart, go to [email or web address].    Your next appointment:   8 week(s)  Provider:   Thomasene Ripple, DO    Dispo:  No follow-ups on file.   Medication Adjustments/Labs and Tests Ordered: Current medicines are reviewed at length with the patient today.  Concerns regarding medicines are outlined above.  Tests Ordered: No orders of the defined types were placed in this encounter.  Medication Changes: No orders of the defined types were placed in this encounter.

## 2023-01-07 ENCOUNTER — Telehealth: Payer: Self-pay | Admitting: Cardiology

## 2023-01-07 MED ORDER — LABETALOL HCL 100 MG PO TABS: 50.0000 mg | ORAL_TABLET | Freq: Three times a day (TID) | ORAL | 3 refills | Status: DC

## 2023-01-07 NOTE — Telephone Encounter (Signed)
Pt c/o BP issue: STAT if pt c/o blurred vision, one-sided weakness or slurred speech  1. What are your last 5 BP readings?  152/102 HR 81 145/97   HR 80 148/101 HR 79 151/105 HR 81 139/93   HR 73  2. Are you having any other symptoms (ex. Dizziness, headache, blurred vision, passed out)? No   3. What is your BP issue? Patient was told to call if she noticed increased of BP. Requesting return call.

## 2023-01-07 NOTE — Telephone Encounter (Signed)
Returned call to pt she states that her BP @ 722am 152/102 HR 81. She states that she is having no symptoms. Her BP 1232 PM 137/98 HR 87. She has not taken the labetalol today. Her feet are swollen, but they are always swollen. She does have the feet elevated. Dr Harriet Masson she should take the 1/2 of a Labetalol or whole?  When should she take a 1/2 or a whole?

## 2023-01-07 NOTE — Telephone Encounter (Signed)
Please increase the labetalol to 100 mg every 8 hours. Berniece Salines,  DO  2:37 PM  Left detailed message to increase Labetalol to '100mg'$  Q8 hours. Updated medication list.

## 2023-01-14 ENCOUNTER — Ambulatory Visit (INDEPENDENT_AMBULATORY_CARE_PROVIDER_SITE_OTHER): Payer: Medicaid Other | Admitting: Obstetrics and Gynecology

## 2023-01-14 ENCOUNTER — Encounter: Payer: Self-pay | Admitting: Obstetrics and Gynecology

## 2023-01-14 VITALS — BP 130/87 | HR 76 | Wt 200.0 lb

## 2023-01-14 DIAGNOSIS — O99212 Obesity complicating pregnancy, second trimester: Secondary | ICD-10-CM

## 2023-01-14 DIAGNOSIS — O99210 Obesity complicating pregnancy, unspecified trimester: Secondary | ICD-10-CM

## 2023-01-14 DIAGNOSIS — Z3A18 18 weeks gestation of pregnancy: Secondary | ICD-10-CM

## 2023-01-14 DIAGNOSIS — O162 Unspecified maternal hypertension, second trimester: Secondary | ICD-10-CM

## 2023-01-14 DIAGNOSIS — O34219 Maternal care for unspecified type scar from previous cesarean delivery: Secondary | ICD-10-CM

## 2023-01-14 DIAGNOSIS — O0992 Supervision of high risk pregnancy, unspecified, second trimester: Secondary | ICD-10-CM

## 2023-01-14 DIAGNOSIS — O169 Unspecified maternal hypertension, unspecified trimester: Secondary | ICD-10-CM

## 2023-01-14 DIAGNOSIS — O0990 Supervision of high risk pregnancy, unspecified, unspecified trimester: Secondary | ICD-10-CM

## 2023-01-14 NOTE — Progress Notes (Signed)
   PRENATAL VISIT NOTE  Subjective:  Dawn Hoffman is a 32 y.o. G3P0110 at [redacted]w[redacted]d being seen today for ongoing prenatal care.  She is currently monitored for the following issues for this high-risk pregnancy and has MIGRAINE, UNSPEC., W/O INTRACTABLE MIGRAINE; HYPERTENSION, BENIGN SYSTEMIC; RHINITIS, ALLERGIC; ASTHMA, UNSPECIFIED; Abnormal fetal ultrasound; Supervision of high risk pregnancy, antepartum; Hypertension affecting pregnancy, antepartum; Previous cesarean delivery affecting pregnancy, antepartum; and Obesity affecting pregnancy, antepartum on their problem list.  Patient reports no complaints.  Contractions: Not present. Vag. Bleeding: None.   . Denies leaking of fluid.   The following portions of the patient's history were reviewed and updated as appropriate: allergies, current medications, past family history, past medical history, past social history, past surgical history and problem list.   Objective:   Vitals:   01/14/23 1621  BP: 130/87  Pulse: 76  Weight: 200 lb (90.7 kg)    Fetal Status:           General:  Alert, oriented and cooperative. Patient is in no acute distress.  Skin: Skin is warm and dry. No rash noted.   Cardiovascular: Normal heart rate noted  Respiratory: Normal respiratory effort, no problems with respiration noted  Abdomen: Soft, gravid, appropriate for gestational age.  Pain/Pressure: Absent     Pelvic: Cervical exam deferred        Extremities: Normal range of motion.  Edema: Trace  Mental Status: Normal mood and affect. Normal behavior. Normal judgment and thought content.   Assessment and Plan:  Pregnancy: G3P0110 at [redacted]w[redacted]d 1. Supervision of high risk pregnancy, antepartum Patient is doing well without complaints  Anatomy ultrasound on 3/13   AFP today  2. Obesity affecting pregnancy, antepartum, unspecified obesity type Continue ASA  3. Hypertension affecting pregnancy, antepartum BP will controlled on labetalol 100 mg BID  4.  Previous cesarean delivery affecting pregnancy, antepartum MOD to be discussed later  Preterm labor symptoms and general obstetric precautions including but not limited to vaginal bleeding, contractions, leaking of fluid and fetal movement were reviewed in detail with the patient. Please refer to After Visit Summary for other counseling recommendations.   Return in about 4 weeks (around 02/11/2023) for in person, ROB, High risk.  Future Appointments  Date Time Provider [place]  01/16/2023 10:15 AM [facility] NURSE [facility]  01/16/2023 10:30 AM [facility] US[facility]  02/11/2023  4:10 PM Loi Rennaker, Vickii Chafe, MD [place] None  02/18/2023  9:00 AM Tobb, Kardie, DO [facility] None  03/15/2023  8:20 AM Tobb, Godfrey Pick, DO [facility] None    Mora Bellman, MD

## 2023-01-16 ENCOUNTER — Ambulatory Visit: Payer: Medicaid Other | Attending: Obstetrics and Gynecology

## 2023-01-16 ENCOUNTER — Ambulatory Visit: Payer: Medicaid Other | Admitting: *Deleted

## 2023-01-16 ENCOUNTER — Other Ambulatory Visit: Payer: Self-pay | Admitting: *Deleted

## 2023-01-16 ENCOUNTER — Encounter: Payer: Self-pay | Admitting: *Deleted

## 2023-01-16 VITALS — BP 130/77 | HR 73

## 2023-01-16 DIAGNOSIS — O99212 Obesity complicating pregnancy, second trimester: Secondary | ICD-10-CM | POA: Diagnosis not present

## 2023-01-16 DIAGNOSIS — O10012 Pre-existing essential hypertension complicating pregnancy, second trimester: Secondary | ICD-10-CM | POA: Diagnosis not present

## 2023-01-16 DIAGNOSIS — O169 Unspecified maternal hypertension, unspecified trimester: Secondary | ICD-10-CM

## 2023-01-16 DIAGNOSIS — O99210 Obesity complicating pregnancy, unspecified trimester: Secondary | ICD-10-CM

## 2023-01-16 DIAGNOSIS — O34219 Maternal care for unspecified type scar from previous cesarean delivery: Secondary | ICD-10-CM | POA: Insufficient documentation

## 2023-01-16 DIAGNOSIS — O10912 Unspecified pre-existing hypertension complicating pregnancy, second trimester: Secondary | ICD-10-CM

## 2023-01-16 DIAGNOSIS — O09899 Supervision of other high risk pregnancies, unspecified trimester: Secondary | ICD-10-CM

## 2023-01-16 DIAGNOSIS — O0990 Supervision of high risk pregnancy, unspecified, unspecified trimester: Secondary | ICD-10-CM | POA: Diagnosis present

## 2023-01-16 DIAGNOSIS — O09212 Supervision of pregnancy with history of pre-term labor, second trimester: Secondary | ICD-10-CM

## 2023-01-16 DIAGNOSIS — E669 Obesity, unspecified: Secondary | ICD-10-CM

## 2023-01-16 DIAGNOSIS — Z3A19 19 weeks gestation of pregnancy: Secondary | ICD-10-CM

## 2023-01-16 LAB — AFP, SERUM, OPEN SPINA BIFIDA
AFP MoM: 1.24
AFP Value: 53.7 ng/mL
Gest. Age on Collection Date: 18.5 weeks
Maternal Age At EDD: 34.2 yr
OSBR Risk 1 IN: 10000
Test Results:: NEGATIVE
Weight: 200 [lb_av]

## 2023-01-16 NOTE — Telephone Encounter (Signed)
tried to call pt again-"your call cannot be completed as dialed" no other number to call. Will try again later

## 2023-01-21 NOTE — Telephone Encounter (Signed)
See 3-11 office visit "#3. Hypertension affecting pregnancy, antepartum BP will controlled on labetalol 100 mg BID"

## 2023-02-11 ENCOUNTER — Encounter: Payer: Self-pay | Admitting: Obstetrics and Gynecology

## 2023-02-11 ENCOUNTER — Ambulatory Visit (INDEPENDENT_AMBULATORY_CARE_PROVIDER_SITE_OTHER): Payer: Medicaid Other | Admitting: Obstetrics and Gynecology

## 2023-02-11 VITALS — BP 123/80 | HR 75 | Wt 201.0 lb

## 2023-02-11 DIAGNOSIS — O169 Unspecified maternal hypertension, unspecified trimester: Secondary | ICD-10-CM

## 2023-02-11 DIAGNOSIS — O0990 Supervision of high risk pregnancy, unspecified, unspecified trimester: Secondary | ICD-10-CM

## 2023-02-11 DIAGNOSIS — Z3A22 22 weeks gestation of pregnancy: Secondary | ICD-10-CM

## 2023-02-11 DIAGNOSIS — O162 Unspecified maternal hypertension, second trimester: Secondary | ICD-10-CM

## 2023-02-11 DIAGNOSIS — O99212 Obesity complicating pregnancy, second trimester: Secondary | ICD-10-CM

## 2023-02-11 DIAGNOSIS — E668 Other obesity: Secondary | ICD-10-CM

## 2023-02-11 DIAGNOSIS — O34219 Maternal care for unspecified type scar from previous cesarean delivery: Secondary | ICD-10-CM

## 2023-02-11 NOTE — Progress Notes (Signed)
Pt presents for ROB visit. Pt planning to travel in May, has concerns about traveling during pregnancy. No other concerns.

## 2023-02-11 NOTE — Progress Notes (Signed)
   PRENATAL VISIT NOTE  Subjective:  Dawn Hoffman is a 32 y.o. G3P0110 at [redacted]w[redacted]d being seen today for ongoing prenatal care.  She is currently monitored for the following issues for this high-risk pregnancy and has MIGRAINE, UNSPEC., W/O INTRACTABLE MIGRAINE; HYPERTENSION, BENIGN SYSTEMIC; RHINITIS, ALLERGIC; ASTHMA, UNSPECIFIED; Abnormal fetal ultrasound; Supervision of high risk pregnancy, antepartum; Hypertension affecting pregnancy, antepartum; Previous cesarean delivery affecting pregnancy, antepartum; and Obesity affecting pregnancy, antepartum on their problem list.  Patient reports no complaints.  Contractions: Not present. Vag. Bleeding: None.  Movement: Present. Denies leaking of fluid.   The following portions of the patient's history were reviewed and updated as appropriate: allergies, current medications, past family history, past medical history, past social history, past surgical history and problem list.   Objective:   Vitals:   02/11/23 1625  BP: 123/80  Pulse: 75  Weight: 201 lb (91.2 kg)    Fetal Status: Fetal Heart Rate (bpm): 143 Fundal Height: 24 cm Movement: Present     General:  Alert, oriented and cooperative. Patient is in no acute distress.  Skin: Skin is warm and dry. No rash noted.   Cardiovascular: Normal heart rate noted  Respiratory: Normal respiratory effort, no problems with respiration noted  Abdomen: Soft, gravid, appropriate for gestational age.  Pain/Pressure: Present     Pelvic: Cervical exam deferred        Extremities: Normal range of motion.  Edema: Trace  Mental Status: Normal mood and affect. Normal behavior. Normal judgment and thought content.   Assessment and Plan:  Pregnancy: G3P0110 at [redacted]w[redacted]d 1. Supervision of high risk pregnancy, antepartum Patient is doing well without complaints Third trimester labs and glucola next visit  2. Hypertension affecting pregnancy, antepartum Follow up growth ultrasound on 02/13/23 Follow up  appointment with OB Cardio on 02/18/23  3. Previous cesarean delivery affecting pregnancy, antepartum Will discuss mode of delivery at next visit. Information on TOLAC provided  4. Other obesity affecting pregnancy, antepartum Continue ASA  Preterm labor symptoms and general obstetric precautions including but not limited to vaginal bleeding, contractions, leaking of fluid and fetal movement were reviewed in detail with the patient. Please refer to After Visit Summary for other counseling recommendations.   Return in about 4 weeks (around 03/11/2023) for in person, ROB, High risk, 2 hr glucola next visit.  Future Appointments  Date Time Provider Department Center  02/13/2023  3:15 PM [facility] NURSE [facility]  02/13/2023  3:30 PM [facility] US2 [facility]  02/18/2023  9:00 AM Tobb, Kardie, DO [facility] None  03/15/2023  8:20 AM Tobb, Lavona Mound, DO [facility] None    Catalina Antigua, MD

## 2023-02-13 ENCOUNTER — Ambulatory Visit: Payer: Medicaid Other | Admitting: *Deleted

## 2023-02-13 ENCOUNTER — Ambulatory Visit: Payer: Medicaid Other | Attending: Obstetrics

## 2023-02-13 VITALS — BP 116/77 | HR 70

## 2023-02-13 DIAGNOSIS — O34219 Maternal care for unspecified type scar from previous cesarean delivery: Secondary | ICD-10-CM | POA: Diagnosis not present

## 2023-02-13 DIAGNOSIS — O99210 Obesity complicating pregnancy, unspecified trimester: Secondary | ICD-10-CM

## 2023-02-13 DIAGNOSIS — O10012 Pre-existing essential hypertension complicating pregnancy, second trimester: Secondary | ICD-10-CM

## 2023-02-13 DIAGNOSIS — O10912 Unspecified pre-existing hypertension complicating pregnancy, second trimester: Secondary | ICD-10-CM | POA: Insufficient documentation

## 2023-02-13 DIAGNOSIS — E669 Obesity, unspecified: Secondary | ICD-10-CM

## 2023-02-13 DIAGNOSIS — O99212 Obesity complicating pregnancy, second trimester: Secondary | ICD-10-CM | POA: Diagnosis not present

## 2023-02-13 DIAGNOSIS — O0990 Supervision of high risk pregnancy, unspecified, unspecified trimester: Secondary | ICD-10-CM | POA: Diagnosis present

## 2023-02-13 DIAGNOSIS — O09212 Supervision of pregnancy with history of pre-term labor, second trimester: Secondary | ICD-10-CM | POA: Diagnosis not present

## 2023-02-13 DIAGNOSIS — O09899 Supervision of other high risk pregnancies, unspecified trimester: Secondary | ICD-10-CM | POA: Insufficient documentation

## 2023-02-13 DIAGNOSIS — O169 Unspecified maternal hypertension, unspecified trimester: Secondary | ICD-10-CM | POA: Insufficient documentation

## 2023-02-13 DIAGNOSIS — Z3A23 23 weeks gestation of pregnancy: Secondary | ICD-10-CM

## 2023-02-14 ENCOUNTER — Other Ambulatory Visit: Payer: Self-pay | Admitting: *Deleted

## 2023-02-14 DIAGNOSIS — O10912 Unspecified pre-existing hypertension complicating pregnancy, second trimester: Secondary | ICD-10-CM

## 2023-02-18 ENCOUNTER — Ambulatory Visit: Payer: Medicaid Other | Attending: Cardiology | Admitting: Cardiology

## 2023-03-11 ENCOUNTER — Other Ambulatory Visit: Payer: Medicaid Other

## 2023-03-11 ENCOUNTER — Encounter: Payer: Medicaid Other | Admitting: Obstetrics and Gynecology

## 2023-03-13 ENCOUNTER — Encounter: Payer: Medicaid Other | Admitting: Obstetrics and Gynecology

## 2023-03-13 ENCOUNTER — Other Ambulatory Visit: Payer: Medicaid Other

## 2023-03-15 ENCOUNTER — Encounter: Payer: Self-pay | Admitting: Cardiology

## 2023-03-15 ENCOUNTER — Ambulatory Visit: Payer: Medicaid Other | Attending: Cardiology | Admitting: Cardiology

## 2023-03-15 VITALS — BP 144/90 | HR 76 | Ht 60.0 in | Wt 208.2 lb

## 2023-03-15 DIAGNOSIS — O10919 Unspecified pre-existing hypertension complicating pregnancy, unspecified trimester: Secondary | ICD-10-CM

## 2023-03-15 DIAGNOSIS — I10 Essential (primary) hypertension: Secondary | ICD-10-CM

## 2023-03-15 DIAGNOSIS — I701 Atherosclerosis of renal artery: Secondary | ICD-10-CM | POA: Diagnosis not present

## 2023-03-15 DIAGNOSIS — Z3A27 27 weeks gestation of pregnancy: Secondary | ICD-10-CM

## 2023-03-15 DIAGNOSIS — O99210 Obesity complicating pregnancy, unspecified trimester: Secondary | ICD-10-CM | POA: Diagnosis not present

## 2023-03-15 MED ORDER — LABETALOL HCL 100 MG PO TABS: 100.0000 mg | ORAL_TABLET | Freq: Three times a day (TID) | ORAL | 3 refills | Status: DC

## 2023-03-15 NOTE — Patient Instructions (Signed)
Medication Instructions:  Your physician has recommended you make the following change in your medication:  START: Labetalol 100 mg three times a day *If you need a refill on your cardiac medications before your next appointment, please call your pharmacy*   Lab Work: None   Testing/Procedures: None   Follow-Up: At [facility], you and your health needs are our priority.  As part of our continuing mission to provide you with exceptional heart care, we have created designated Provider Care Teams.  These Care Teams include your primary Cardiologist (physician) and Advanced Practice Providers (APPs -  Physician Assistants and Nurse Practitioners) who all work together to provide you with the care you need, when you need it.    Your next appointment:   3 week(s)  Provider:   Thomasene Ripple, DO or [place] Planas

## 2023-03-15 NOTE — Progress Notes (Signed)
Cardio-Obstetrics Clinic  New Evaluation  Date:  03/15/2023   ID:  Dawn Hoffman, DOB 06-29-1991, MRN [id number]  PCP:  Patient, No Pcp Per   [facility] Providers Cardiologist:  Thomasene Ripple, DO  Electrophysiologist:  None       Referring MD: No ref. provider found   Chief Complaint: " I am doing well"  History of Present Illness:    Dawn Hoffman is a 32 y.o. female [G3P0110] who is being seen today for the evaluation of chronic hypertension in pregnancy at the request of No ref. provider found.   Medical history includes renal artery stenosis seen on recent renal US, chronic hypertension in pregnancy, anxiety, here today to be evaluated for high blood pressure in pregnancy.  She tells me that she was diagnosed with hypertension at the age of 38.  She tells me as a child she did see cardiology.  But the thing for her blood pressure was not really known.  None of her parents have high blood pressure she tells me.  In terms of her gynecologic history she notes that she has had 2 previous pregnancy the first ended in a spontaneous/miscarriage at week 5-6, the second delivery at 24 weeks with a baby boy at home she lost 1 week later.  At her initial visit with me she short of breath I ordered an echo and a a renal ultrasound.   She got her echo which was normal.  Renal ultrasound shows renal artery stenosis in bilateral arteries 1-59% stenosis. She is 27 weeks  Since I saw patient she has been doing well.  She tells me that there looking into delivering her baby at 35 weeks.  Prior CV Studies Reviewed: The following studies were reviewed today:  TTE 12/2022 IMPRESSIONS     1. Left ventricular ejection fraction, by estimation, is 60 to 65%. The  left ventricle has normal function. The left ventricle has no regional  wall motion abnormalities. Left ventricular diastolic parameters were  normal. The average left ventricular  global longitudinal strain is  -22.0 %. The global longitudinal strain is  normal.   2. Right ventricular systolic function is normal. The right ventricular  size is normal.   3. The mitral valve is normal in structure. Trivial mitral valve  regurgitation. No evidence of mitral stenosis.   4. The aortic valve is normal in structure. Aortic valve regurgitation is  not visualized. No aortic stenosis is present.   5. The inferior vena cava is normal in size with greater than 50%  respiratory variability, suggesting right atrial pressure of 3 mmHg.   Comparison(s): No prior Echocardiogram.   Conclusion(s)/Recommendation(s): Normal biventricular function without  evidence of hemodynamically significant valvular heart disease.   FINDINGS   Left Ventricle: Left ventricular ejection fraction, by estimation, is 60  to 65%. The left ventricle has normal function. The left ventricle has no  regional wall motion abnormalities. The average left ventricular global  longitudinal strain is -22.0 %.  The global longitudinal strain is normal. The left ventricular internal  cavity size was normal in size. There is no left ventricular hypertrophy.  Left ventricular diastolic parameters were normal.   Right Ventricle: The right ventricular size is normal. Right ventricular  systolic function is normal.   Left Atrium: Left atrial size was normal in size.   Right Atrium: Right atrial size was normal in size.   Pericardium: There is no evidence of pericardial effusion.   Mitral Valve: The mitral valve  is normal in structure. Trivial mitral  valve regurgitation. No evidence of mitral valve stenosis.   Tricuspid Valve: The tricuspid valve is normal in structure. Tricuspid  valve regurgitation is trivial. No evidence of tricuspid stenosis.   Aortic Valve: The aortic valve is normal in structure. Aortic valve  regurgitation is not visualized. No aortic stenosis is present.   Pulmonic Valve: The pulmonic valve was normal in  structure. Pulmonic valve  regurgitation is trivial. No evidence of pulmonic stenosis.   Aorta: The aortic root is normal in size and structure.   Venous: The inferior vena cava is normal in size with greater than 50%  respiratory variability, suggesting right atrial pressure of 3 mmHg.   IAS/Shunts: No atrial level shunt detected by color flow Doppler.   Doppler US 12/20/2022 Examination Guidelines: A complete evaluation includes B-mode imaging,  spectral  Doppler, color Doppler, and power Doppler as needed of all accessible  portions  of each vessel. Bilateral testing is considered an integral part of a  complete  examination. Limited examinations for reoccurring indications may be  performed  as noted.     Duplex Findings:  +--------------------+--------+--------+------+----------------------+  Mesenteric         PSV cm/sEDV cm/sPlaque       Comments         +--------------------+--------+--------+------+----------------------+  Aorta Prox            120                 1.7 cm AP x 1.8 cm TRV  +--------------------+--------+--------+------+----------------------+  Aorta Distal           96                                         +--------------------+--------+--------+------+----------------------+  Celiac Artery Origin  131                                         +--------------------+--------+--------+------+----------------------+  SMA Origin            138      18                                 +--------------------+--------+--------+------+----------------------+           +------------------+--------+--------+-------+  Right Renal ArteryPSV cm/sEDV cm/sComment  +------------------+--------+--------+-------+  Origin              95      29            +------------------+--------+--------+-------+  Proximal           181      60            +------------------+--------+--------+-------+  Mid                210       67            +------------------+--------+--------+-------+  Distal             132      39            +------------------+--------+--------+-------+   +-----------------+--------+--------+-------+  Left Renal ArteryPSV cm/sEDV cm/sComment  +-----------------+--------+--------+-------+  Origin  112      37            +-----------------+--------+--------+-------+  Proximal          172      58            +-----------------+--------+--------+-------+  Mid               183      61            +-----------------+--------+--------+-------+  Distal            192      60            +-----------------+--------+--------+-------+     Technologist observations: Incidental findings: Midline epigastric fluid  filled bowel with thickened wall, measuring .48 cm, ? etiology.   +------------+--------+--------+----+-----------+--------+--------+----+  Right KidneyPSV cm/sEDV cm/sRI  Left KidneyPSV cm/sEDV cm/sRI    +------------+--------+--------+----+-----------+--------+--------+----+  Upper Pole  34      11      0.66Upper Pole 39      14      0.65  +------------+--------+--------+----+-----------+--------+--------+----+  Mid        55      19      0.        23      9       0.59  +------------+--------+--------+----+-----------+--------+--------+----+  Lower Pole  22      8       0.64Lower Pole 21      7       0.69  +------------+--------+--------+----+-----------+--------+--------+----+  Hilar      78      29      0.62Hilar      93      32      0.66  +------------+--------+--------+----+-----------+--------+--------+----+   +------------------+-------+------------------+-------+  Right Kidney             Left Kidney                +------------------+-------+------------------+-------+  RAR                     RAR                        +------------------+-------+------------------+-------+   RAR (manual)      1.75   RAR (manual)      1.60     +------------------+-------+------------------+-------+  Cortex           1.02 cmCortex            1.01 cm  +------------------+-------+------------------+-------+  Cortex thickness         Corex thickness            +------------------+-------+------------------+-------+  Kidney length (cm)10.77  Kidney length (cm)11.10    +------------------+-------+------------------+-------+     Summary:  Largest Aortic Diameter: 1.8 cm    Renal:    Right: Normal size right kidney. Normal right Resistive Index.         Normal cortical thickness of right kidney. 1-59% stenosis of         the right renal artery. RRV flow present.  Left:  Normal size of left kidney. Normal left Resistive Index.         Normal cortical thickness of the left kidney. 1-59% stenosis         of the left renal artery. LRV flow present.  Mesenteric:  Normal Celiac artery and Superior Mesenteric artery findings.  Patent IVC.    Incidental findings: Midline epigastric fluid filled bowel with thickened  wall,  measuring .48 cm,  ? etiology.    Past Medical History:  Diagnosis Date   Anemia    Anxiety    Asthma    Dyspnea    Headache    Hypertension     Past Surgical History:  Procedure Laterality Date   CESAREAN SECTION N/A 12/08/2018   Procedure: CESAREAN SECTION;  Surgeon: Hermina Staggers, MD;  Location: [facility] BIRTHING SU[facility];  Service: Obstetrics;  Laterality: N/A;      OB History     Gravida  3   Para  1   Term      Preterm  1   AB  1   Living  0      SAB  1   IAB      Ectopic      Multiple  0   Live Births  1               Current Medications: Current Meds  Medication Sig   aspirin EC 81 MG tablet Take 1 tablet (81 mg total) by mouth daily. Take after 12 weeks for prevention of preeclampsia later in pregnancy   Blood Pressure Monitoring (BLOOD PRESSURE KIT) DEVI 1 Device by Does not apply route once  a week.   fexofenadine (ALLEGRA) 180 MG tablet Take 180 mg by mouth daily.   labetalol (NORMODYNE) 100 MG tablet Take 1 tablet (100 mg total) by mouth 3 (three) times daily.   loratadine (CLARITIN) 10 MG tablet Take 1 tablet (10 mg total) by mouth daily.   montelukast (SINGULAIR) 10 MG tablet Take 10 mg by mouth as needed.   Prenatal Vit-Fe Fumarate-FA (PRENATAL VITAMIN PO) Take 1 tablet by mouth daily.   [DISCONTINUED] labetalol (NORMODYNE) 100 MG tablet Take 0.5 tablets (50 mg total) by mouth every 8 (eight) hours.     Allergies:   Patient has no known allergies.   Social History   Socioeconomic History   Marital status: Single    Spouse name: Not on file   Number of children: Not on file   Years of education: Not on file   Highest education level: Not on file  Occupational History   Occupation: Magazine features editor: [employer]  Tobacco Use   Smoking status: Never   Smokeless tobacco: Never  Vaping Use   Vaping Use: Never used  Substance and Sexual Activity   Alcohol use: No   Drug use: No   Sexual activity: Yes    Birth control/protection: None  Other Topics Concern   Not on file  Social History Narrative   Not on file   Social Determinants of Health   Financial Resource Strain: Low Risk  (09/18/2018)   Overall Financial Resource Strain (CARDIA)    Difficulty of Paying Living Expenses: Not hard at all  Food Insecurity: Unknown (09/18/2018)   Hunger Vital Sign    Worried About Running Out of Food in the Last Year: Never true    Ran Out of Food in the Last Year: Not on file  Transportation Needs: No Transportation Needs (09/18/2018)   PRAPARE - Administrator, Civil Service (Medical): No    Lack of Transportation (Non-Medical): No  Physical Activity: Not on file  Stress: Not on file  Social Connections: Not on file      Family History  Problem Relation Age of Onset   Cancer  Maternal Grandmother    Diabetes Maternal Grandfather     Alcohol abuse Father    Drug abuse Father    Anemia Mother       ROS:   Please see the history of present illness.     All other systems reviewed and are negative.   Labs/EKG Reviewed:    EKG:   EKG is was ordered today.  The ekg ordered today demonstrates sinus rhythm.  Recent Labs: 11/08/2022: ALT 11; BUN 6; Creatinine, Ser 0.57; Hemoglobin 12.2; Platelets 242; Potassium 3.9; Sodium 137   Recent Lipid Panel No results found for: "CHOL", "TRIG", "HDL", "CHOLHDL", "LDLCALC", "LDLDIRECT"  Physical Exam:    VS:  BP (!) 144/90   Pulse 76   Ht 5' (1.524 m)   Wt 208 lb 3.2 oz (94.4 kg)   LMP 09/05/2022   SpO2 99%   BMI 40.66 kg/m     Wt Readings from Last 3 Encounters:  03/15/23 208 lb 3.2 oz (94.4 kg)  02/11/23 201 lb (91.2 kg)  01/14/23 200 lb (90.7 kg)     GEN:  Well nourished, well developed in no acute distress HEENT: Normal NECK: No JVD; No carotid bruits LYMPHATICS: No lymphadenopathy CARDIAC: RRR, no murmurs, rubs, gallops RESPIRATORY:  Clear to auscultation without rales, wheezing or rhonchi  ABDOMEN: Soft, non-tender, non-distended MUSCULOSKELETAL: Trace lower extremity bilateral edema; No deformity  SKIN: Warm and dry NEUROLOGIC:  Alert and oriented x 3 PSYCHIATRIC:  Normal affect    Risk Assessment/Risk Calculators:                 ASSESSMENT & PLAN:    Chronic hypertension in pregnancy Dyspnea on exertion Leg swelling Obesity in pregnancy. Renal Artery Stenosis  Her blood pressure is elevated in the office today.  Will increase her labetalol to 100 mg every 8 hours.   The patient understands the need to lose weight with diet and exercise. We have discussed specific strategies for this.  The patient is in agreement with the above plan. The patient left the office in stable condition.  The patient will follow up in 3 weeks.   Patient Instructions  Medication Instructions:  Your physician has recommended you make the following change  in your medication:  START: Labetalol 100 mg three times a day *If you need a refill on your cardiac medications before your next appointment, please call your pharmacy*   Lab Work: None   Testing/Procedures: None   Follow-Up: At [facility], you and your health needs are our priority.  As part of our continuing mission to provide you with exceptional heart care, we have created designated Provider Care Teams.  These Care Teams include your primary Cardiologist (physician) and Advanced Practice Providers (APPs -  Physician Assistants and Nurse Practitioners) who all work together to provide you with the care you need, when you need it.    Your next appointment:   3 week(s)  Provider:   Thomasene Ripple, DO or [place] Planas    Dispo:  No follow-ups on file.   Medication Adjustments/Labs and Tests Ordered: Current medicines are reviewed at length with the patient today.  Concerns regarding medicines are outlined above.  Tests Ordered: Orders Placed This Encounter  Procedures   AMB Referral to [facility] Pharm[facility]   Medication Changes: Meds ordered this encounter  Medications   labetalol (NORMODYNE) 100 MG tablet    Sig: Take 1 tablet (100 mg total) by mouth 3 (three) times daily.    Dispense:  270 tablet    Refill:  3

## 2023-03-20 ENCOUNTER — Encounter: Payer: Self-pay | Admitting: Advanced Practice Midwife

## 2023-03-20 ENCOUNTER — Other Ambulatory Visit: Payer: Medicaid Other

## 2023-03-20 ENCOUNTER — Ambulatory Visit (INDEPENDENT_AMBULATORY_CARE_PROVIDER_SITE_OTHER): Payer: Medicaid Other | Admitting: Advanced Practice Midwife

## 2023-03-20 VITALS — BP 135/89 | HR 79 | Wt 210.6 lb

## 2023-03-20 DIAGNOSIS — O99013 Anemia complicating pregnancy, third trimester: Secondary | ICD-10-CM

## 2023-03-20 DIAGNOSIS — O0990 Supervision of high risk pregnancy, unspecified, unspecified trimester: Secondary | ICD-10-CM

## 2023-03-20 DIAGNOSIS — O1203 Gestational edema, third trimester: Secondary | ICD-10-CM

## 2023-03-20 DIAGNOSIS — O10913 Unspecified pre-existing hypertension complicating pregnancy, third trimester: Secondary | ICD-10-CM

## 2023-03-20 DIAGNOSIS — Z23 Encounter for immunization: Secondary | ICD-10-CM

## 2023-03-20 DIAGNOSIS — Z3A28 28 weeks gestation of pregnancy: Secondary | ICD-10-CM

## 2023-03-20 DIAGNOSIS — O09299 Supervision of pregnancy with other poor reproductive or obstetric history, unspecified trimester: Secondary | ICD-10-CM

## 2023-03-20 MED ORDER — LABETALOL HCL 200 MG PO TABS
200.0000 mg | ORAL_TABLET | Freq: Two times a day (BID) | ORAL | 5 refills | Status: DC
Start: 2023-03-20 — End: 2023-07-09

## 2023-03-20 NOTE — Progress Notes (Signed)
   PRENATAL VISIT NOTE  Subjective:  Dawn Hoffman is a 32 y.o. G3P0110 at [redacted]w[redacted]d being seen today for ongoing prenatal care.  She is currently monitored for the following issues for this high-risk pregnancy and has MIGRAINE, UNSPEC., W/O INTRACTABLE MIGRAINE; HYPERTENSION, BENIGN SYSTEMIC; RHINITIS, ALLERGIC; ASTHMA, UNSPECIFIED; Abnormal fetal ultrasound; Supervision of high risk pregnancy, antepartum; Chronic hypertension in obstetric context in third trimester; Previous cesarean delivery affecting pregnancy, antepartum; and Obesity affecting pregnancy, antepartum on their problem list.  Patient reports  edema of feet that resolves with elevation/compression .  Contractions: Not present. Vag. Bleeding: None.  Movement: Present. Denies leaking of fluid.   The following portions of the patient's history were reviewed and updated as appropriate: allergies, current medications, past family history, past medical history, past social history, past surgical history and problem list.   Objective:   Vitals:   03/20/23 0848 03/20/23 0945  BP: (!) 153/96 135/89  Pulse: 76 79  Weight: 210 lb 9.6 oz (95.5 kg)     Fetal Status: Fetal Heart Rate (bpm): 135   Movement: Present     General:  Alert, oriented and cooperative. Patient is in no acute distress.  Skin: Skin is warm and dry. No rash noted.   Cardiovascular: Normal heart rate noted  Respiratory: Normal respiratory effort, no problems with respiration noted  Abdomen: Soft, gravid, appropriate for gestational age.  Pain/Pressure: Present     Pelvic: Cervical exam deferred        Extremities: Normal range of motion.  Edema: Trace  Mental Status: Normal mood and affect. Normal behavior. Normal judgment and thought content.   Assessment and Plan:  Pregnancy: G3P0110 at [redacted]w[redacted]d 1. Supervision of high risk pregnancy, antepartum --Anticipatory guidance about next visits/weeks of pregnancy given.   - HIV antibody (with reflex) - RPR -  CBC - Glucose Tolerance, 2 Hours w/1 Hour - Tdap vaccine greater than or equal to 7yo IM  2. [redacted] weeks gestation of pregnancy   3. Chronic hypertension in obstetric context, third trimester --BP elevated today and at recent cardiology visit.  Dr Servando Salina increased labetalol 100 mg to TID (pt was taking BID). Pt is having difficulty with TID dosing. --Will change to 200 mg BID, discussed with pt that dose may need to be increased, or other medications added as pregnancy progresses.  --Antenatal testing at 32 weeks --CMP to complete baseline HTN labs today  - Comp Met (CMET) - labetalol (NORMODYNE) 200 MG tablet; Take 1 tablet (200 mg total) by mouth 2 (two) times daily.  Dispense: 60 tablet; Refill: 5  4. Pregnancy with poor obstetric history --Previous FGR, reverse end diastolic flow, and PTD at 29 weeks with neonatal demise  5. Edema during pregnancy in third trimester --Dependent, improves with elevation/compression  Preterm labor symptoms and general obstetric precautions including but not limited to vaginal bleeding, contractions, leaking of fluid and fetal movement were reviewed in detail with the patient. Please refer to After Visit Summary for other counseling recommendations.   Return in about 2 weeks (around 04/03/2023) for HROB.  Future Appointments  Date Time Provider Department Center  03/25/2023  3:15 PM [facility] NURSE [facility]  03/25/2023  3:30 PM [facility]  04/03/2023  8:40 AM Tobb, Kardie, DO [facility] None  04/03/2023  1:50 PM Adam Phenix, MD [facility] None  04/22/2023  8:30 AM [facility] PHARMACIST [facility] None    Sharen Counter, CNM

## 2023-03-20 NOTE — Progress Notes (Signed)
Pt presents for ROB visit. No concerns.  

## 2023-03-21 LAB — COMPREHENSIVE METABOLIC PANEL
ALT: 13 IU/L (ref 0–32)
AST: 13 IU/L (ref 0–40)
Albumin/Globulin Ratio: 1.5 (ref 1.2–2.2)
Albumin: 3.5 g/dL — ABNORMAL LOW (ref 3.9–4.9)
Alkaline Phosphatase: 41 IU/L — ABNORMAL LOW (ref 44–121)
BUN/Creatinine Ratio: 14 (ref 9–23)
BUN: 6 mg/dL (ref 6–20)
Bilirubin Total: <0.2 mg/dL (ref 0.0–1.2)
CO2: 19 mmol/L — ABNORMAL LOW (ref 20–29)
Calcium: 9 mg/dL (ref 8.7–10.2)
Chloride: 103 mmol/L (ref 96–106)
Creatinine, Ser: 0.42 mg/dL — ABNORMAL LOW (ref 0.57–1.00)
Globulin, Total: 2.4 g/dL (ref 1.5–4.5)
Glucose: 78 mg/dL (ref 70–99)
Potassium: 3.9 mmol/L (ref 3.5–5.2)
Sodium: 135 mmol/L (ref 134–144)
Total Protein: 5.9 g/dL — ABNORMAL LOW (ref 6.0–8.5)
eGFR: 132 mL/min/{1.73_m2} (ref >59)

## 2023-03-21 LAB — RPR: RPR Ser Ql: NONREACTIVE

## 2023-03-21 LAB — CBC
Hematocrit: 32.6 % — ABNORMAL LOW (ref 34.0–46.6)
Hemoglobin: 10.7 g/dL — ABNORMAL LOW (ref 11.1–15.9)
MCH: 26.7 pg (ref 26.6–33.0)
MCHC: 32.8 g/dL (ref 31.5–35.7)
MCV: 81 fL (ref 79–97)
Platelets: 220 10*3/uL (ref 150–450)
RBC: 4.01 x10E6/uL (ref 3.77–5.28)
RDW: 12.9 % (ref 11.7–15.4)
WBC: 9.3 10*3/uL (ref 3.4–10.8)

## 2023-03-21 LAB — HIV ANTIBODY (ROUTINE TESTING W REFLEX): HIV Screen 4th Generation wRfx: NONREACTIVE

## 2023-03-21 LAB — GLUCOSE TOLERANCE, 2 HOURS W/ 1HR
Glucose, 1 hour: 109 mg/dL (ref 70–179)
Glucose, 2 hour: 106 mg/dL (ref 70–152)
Glucose, Fasting: 77 mg/dL (ref 70–91)

## 2023-03-25 ENCOUNTER — Ambulatory Visit: Payer: Medicaid Other | Attending: Obstetrics and Gynecology

## 2023-03-25 ENCOUNTER — Ambulatory Visit: Payer: Medicaid Other

## 2023-03-25 VITALS — BP 136/76 | HR 75

## 2023-03-25 DIAGNOSIS — O99210 Obesity complicating pregnancy, unspecified trimester: Secondary | ICD-10-CM

## 2023-03-25 DIAGNOSIS — O99213 Obesity complicating pregnancy, third trimester: Secondary | ICD-10-CM | POA: Diagnosis not present

## 2023-03-25 DIAGNOSIS — O0990 Supervision of high risk pregnancy, unspecified, unspecified trimester: Secondary | ICD-10-CM | POA: Insufficient documentation

## 2023-03-25 DIAGNOSIS — Z3A28 28 weeks gestation of pregnancy: Secondary | ICD-10-CM

## 2023-03-25 DIAGNOSIS — E669 Obesity, unspecified: Secondary | ICD-10-CM | POA: Diagnosis not present

## 2023-03-25 DIAGNOSIS — O10913 Unspecified pre-existing hypertension complicating pregnancy, third trimester: Secondary | ICD-10-CM | POA: Insufficient documentation

## 2023-03-25 DIAGNOSIS — O09299 Supervision of pregnancy with other poor reproductive or obstetric history, unspecified trimester: Secondary | ICD-10-CM | POA: Insufficient documentation

## 2023-03-25 DIAGNOSIS — A6000 Herpesviral infection of urogenital system, unspecified: Secondary | ICD-10-CM | POA: Insufficient documentation

## 2023-03-25 DIAGNOSIS — O10912 Unspecified pre-existing hypertension complicating pregnancy, second trimester: Secondary | ICD-10-CM | POA: Diagnosis present

## 2023-03-25 DIAGNOSIS — O10013 Pre-existing essential hypertension complicating pregnancy, third trimester: Secondary | ICD-10-CM

## 2023-03-25 DIAGNOSIS — O34219 Maternal care for unspecified type scar from previous cesarean delivery: Secondary | ICD-10-CM | POA: Diagnosis present

## 2023-03-25 HISTORY — DX: Herpesviral infection of urogenital system, unspecified: A60.00

## 2023-03-25 MED ORDER — FERROUS SULFATE 324 (65 FE) MG PO TBEC
1.0000 | DELAYED_RELEASE_TABLET | ORAL | 5 refills | Status: DC
Start: 2023-03-25 — End: 2023-05-27

## 2023-03-25 NOTE — Addendum Note (Signed)
Addended by: Sharen Counter A on: 03/25/2023 01:47 PM   Modules accepted: Orders

## 2023-03-26 ENCOUNTER — Other Ambulatory Visit: Payer: Self-pay | Admitting: *Deleted

## 2023-03-26 DIAGNOSIS — O99213 Obesity complicating pregnancy, third trimester: Secondary | ICD-10-CM

## 2023-03-26 DIAGNOSIS — Z8759 Personal history of other complications of pregnancy, childbirth and the puerperium: Secondary | ICD-10-CM

## 2023-03-26 DIAGNOSIS — O10913 Unspecified pre-existing hypertension complicating pregnancy, third trimester: Secondary | ICD-10-CM

## 2023-04-03 ENCOUNTER — Ambulatory Visit (INDEPENDENT_AMBULATORY_CARE_PROVIDER_SITE_OTHER): Payer: Medicaid Other | Admitting: Obstetrics & Gynecology

## 2023-04-03 ENCOUNTER — Encounter: Payer: Self-pay | Admitting: Cardiology

## 2023-04-03 ENCOUNTER — Ambulatory Visit: Payer: Medicaid Other | Attending: Cardiology | Admitting: Cardiology

## 2023-04-03 VITALS — BP 122/80 | HR 85 | Wt 211.1 lb

## 2023-04-03 VITALS — BP 132/80 | HR 76 | Ht 60.0 in | Wt 211.4 lb

## 2023-04-03 DIAGNOSIS — I701 Atherosclerosis of renal artery: Secondary | ICD-10-CM

## 2023-04-03 DIAGNOSIS — Z3A30 30 weeks gestation of pregnancy: Secondary | ICD-10-CM

## 2023-04-03 DIAGNOSIS — O10919 Unspecified pre-existing hypertension complicating pregnancy, unspecified trimester: Secondary | ICD-10-CM | POA: Diagnosis not present

## 2023-04-03 DIAGNOSIS — O0993 Supervision of high risk pregnancy, unspecified, third trimester: Secondary | ICD-10-CM

## 2023-04-03 DIAGNOSIS — O10913 Unspecified pre-existing hypertension complicating pregnancy, third trimester: Secondary | ICD-10-CM

## 2023-04-03 DIAGNOSIS — Z3A27 27 weeks gestation of pregnancy: Secondary | ICD-10-CM

## 2023-04-03 DIAGNOSIS — O34219 Maternal care for unspecified type scar from previous cesarean delivery: Secondary | ICD-10-CM

## 2023-04-03 DIAGNOSIS — O99210 Obesity complicating pregnancy, unspecified trimester: Secondary | ICD-10-CM | POA: Diagnosis not present

## 2023-04-03 DIAGNOSIS — O0990 Supervision of high risk pregnancy, unspecified, unspecified trimester: Secondary | ICD-10-CM

## 2023-04-03 NOTE — Patient Instructions (Signed)
Medication Instructions:  Your physician recommends that you continue on your current medications as directed. Please refer to the Current Medication list given to you today.  *If you need a refill on your cardiac medications before your next appointment, please call your pharmacy*   Lab Work: None   Testing/Procedures: None   Follow-Up: At [facility], you and your health needs are our priority.  As part of our continuing mission to provide you with exceptional heart care, we have created designated Provider Care Teams.  These Care Teams include your primary Cardiologist (physician) and Advanced Practice Providers (APPs -  Physician Assistants and Nurse Practitioners) who all work together to provide you with the care you need, when you need it.  Your next appointment:   5 week(s)  Provider:   Thomasene Ripple, DO     Other instructions: Please take your blood pressure daily for 2 weeks and send in a MyChart message. Please include heart rates.   HOW TO TAKE YOUR BLOOD PRESSURE: Rest 5 minutes before taking your blood pressure. Don't smoke or drink caffeinated beverages for at least 30 minutes before. Take your blood pressure before (not after) you eat. Sit comfortably with your back supported and both feet on the floor (don't cross your legs). Elevate your arm to heart level on a table or a desk. Use the proper sized cuff. It should fit smoothly and snugly around your bare upper arm. There should be enough room to slip a fingertip under the cuff. The bottom edge of the cuff should be 1 inch above the crease of the elbow. Ideally, take 3 measurements at one sitting and record the average.

## 2023-04-03 NOTE — Progress Notes (Signed)
   PRENATAL VISIT NOTE  Subjective:  Dawn Hoffman is a 32 y.o. G3P0110 at [redacted]w[redacted]d being seen today for ongoing prenatal care.  She is currently monitored for the following issues for this high-risk pregnancy and has MIGRAINE, UNSPEC., W/O INTRACTABLE MIGRAINE; HYPERTENSION, BENIGN SYSTEMIC; RHINITIS, ALLERGIC; ASTHMA, UNSPECIFIED; Abnormal fetal ultrasound; Supervision of high risk pregnancy, antepartum; Chronic hypertension in obstetric context in third trimester; Previous cesarean delivery affecting pregnancy, antepartum; Obesity affecting pregnancy, antepartum; Genital herpes simplex; and Pregnancy with poor obstetric history (neonatal demise at 29 weeks due to ICH) on their problem list.  Patient reports no complaints.  Contractions: Not present. Vag. Bleeding: None.  Movement: Present. Denies leaking of fluid.   The following portions of the patient's history were reviewed and updated as appropriate: allergies, current medications, past family history, past medical history, past social history, past surgical history and problem list.   Objective:   Vitals:   04/03/23 1347  BP: 122/80  Pulse: 85  Weight: 211 lb 1.6 oz (95.8 kg)    Fetal Status: Fetal Heart Rate (bpm): 145 Fundal Height: 31 cm Movement: Present     General:  Alert, oriented and cooperative. Patient is in no acute distress.  Skin: Skin is warm and dry. No rash noted.   Cardiovascular: Normal heart rate noted  Respiratory: Normal respiratory effort, no problems with respiration noted  Abdomen: Soft, gravid, appropriate for gestational age.  Pain/Pressure: Present     Pelvic: Cervical exam deferred        Extremities: Normal range of motion.  Edema: Trace  Mental Status: Normal mood and affect. Normal behavior. Normal judgment and thought content.   Assessment and Plan:  Pregnancy: G3P0110 at [redacted]w[redacted]d 1. Supervision of high risk pregnancy, antepartum F/u US in 2 weeks and start weekly testing for [facility]  Preterm  labor symptoms and general obstetric precautions including but not limited to vaginal bleeding, contractions, leaking of fluid and fetal movement were reviewed in detail with the patient. Please refer to After Visit Summary for other counseling recommendations.   Return in about 3 weeks (around 04/24/2023).  Future Appointments  Date Time Provider Department Center  04/08/2023  8:30 AM [facility] NURSE [facility]  04/08/2023  8:45 AM [facility] NST [facility]  04/15/2023  8:30 AM [facility] NURSE [facility]  04/15/2023  8:45 AM [facility] NST [facility]  04/22/2023  8:30 AM [facility] PHARMACIST [facility] None  04/23/2023  3:30 PM [facility] NURSE [facility]  04/23/2023  3:45 PM [facility] US1 [facility]  04/29/2023  8:30 AM [facility] NURSE [facility]  04/29/2023  8:45 AM [facility] NST [facility]  05/06/2023  8:30 AM [facility] NURSE [facility]  05/06/2023  8:45 AM [facility] NST [facility]  05/13/2023  9:30 AM [facility] NURSE [facility]  05/13/2023  9:45 AM [facility] NST [facility]  05/13/2023  3:30 PM Tobb, Kardie, DO [facility] None    Scheryl Darter, MD

## 2023-04-03 NOTE — Progress Notes (Signed)
Cardio-Obstetrics Clinic  New Evaluation  Date:  04/05/2023   ID:  Dawn Hoffman, DOB 1991-04-18, MRN [id number]  PCP:  Patient, No Pcp Per   [facility] Providers Cardiologist:  Thomasene Ripple, DO  Electrophysiologist:  None       Referring MD: No ref. provider found   Chief Complaint: " I am doing well"  History of Present Illness:    Dawn Hoffman is a 32 y.o. female [G3P0110] who is being seen today for the evaluation of chronic hypertension in pregnancy at the request of No ref. provider found.   Medical history includes renal artery stenosis seen on recent renal US, chronic hypertension in pregnancy, anxiety, here today to be evaluated for high blood pressure in pregnancy.  She tells me that she was diagnosed with hypertension at the age of 32.  She tells me as a child she did see cardiology.  But the thing for her blood pressure was not really known.  None of her parents have high blood pressure she tells me.  In terms of her gynecologic history she notes that she has had 2 previous pregnancy the first ended in a spontaneous/miscarriage at week 5-6, the second delivery at 94 weeks with a baby boy at home she lost 1 week later.  At her initial visit with me she short of breath I ordered an echo and a a renal ultrasound.   She got her echo which was normal.  Renal ultrasound shows renal artery stenosis in bilateral arteries 1-59% stenosis. She is 27 weeks  I saw the patient on Mar 15, 2023 at that time we increased her labetalol to 100 mg every 8 hours.  She is here for follow-up.  Since I saw the patient her antihypertensive labetalol was increased to 200 mg 3 times daily.  She is [redacted] weeks pregnant today  Prior CV Studies Reviewed: The following studies were reviewed today:  TTE 12/2022 IMPRESSIONS    1. Left ventricular ejection fraction, by estimation, is 60 to 65%. The  left ventricle has normal function. The left ventricle has no regional  wall  motion abnormalities. Left ventricular diastolic parameters were  normal. The average left ventricular  global longitudinal strain is -22.0 %. The global longitudinal strain is  normal.   2. Right ventricular systolic function is normal. The right ventricular  size is normal.   3. The mitral valve is normal in structure. Trivial mitral valve  regurgitation. No evidence of mitral stenosis.   4. The aortic valve is normal in structure. Aortic valve regurgitation is  not visualized. No aortic stenosis is present.   5. The inferior vena cava is normal in size with greater than 50%  respiratory variability, suggesting right atrial pressure of 3 mmHg.   Comparison(s): No prior Echocardiogram.   Conclusion(s)/Recommendation(s): Normal biventricular function without  evidence of hemodynamically significant valvular heart disease.   FINDINGS   Left Ventricle: Left ventricular ejection fraction, by estimation, is 60  to 65%. The left ventricle has normal function. The left ventricle has no  regional wall motion abnormalities. The average left ventricular global  longitudinal strain is -22.0 %.  The global longitudinal strain is normal. The left ventricular internal  cavity size was normal in size. There is no left ventricular hypertrophy.  Left ventricular diastolic parameters were normal.   Right Ventricle: The right ventricular size is normal. Right ventricular  systolic function is normal.   Left Atrium: Left atrial size was normal in size.  Right Atrium: Right atrial size was normal in size.   Pericardium: There is no evidence of pericardial effusion.   Mitral Valve: The mitral valve is normal in structure. Trivial mitral  valve regurgitation. No evidence of mitral valve stenosis.   Tricuspid Valve: The tricuspid valve is normal in structure. Tricuspid  valve regurgitation is trivial. No evidence of tricuspid stenosis.   Aortic Valve: The aortic valve is normal in structure.  Aortic valve  regurgitation is not visualized. No aortic stenosis is present.   Pulmonic Valve: The pulmonic valve was normal in structure. Pulmonic valve  regurgitation is trivial. No evidence of pulmonic stenosis.   Aorta: The aortic root is normal in size and structure.   Venous: The inferior vena cava is normal in size with greater than 50%  respiratory variability, suggesting right atrial pressure of 3 mmHg.   IAS/Shunts: No atrial level shunt detected by color flow Doppler.   Doppler US 12/20/2022 Examination Guidelines: A complete evaluation includes B-mode imaging,  spectral  Doppler, color Doppler, and power Doppler as needed of all accessible  portions  of each vessel. Bilateral testing is considered an integral part of a  complete  examination. Limited examinations for reoccurring indications may be  performed  as noted.     Duplex Findings:  +--------------------+--------+--------+------+----------------------+  Mesenteric         PSV cm/sEDV cm/sPlaque       Comments         +--------------------+--------+--------+------+----------------------+  Aorta Prox            120                 1.7 cm AP x 1.8 cm TRV  +--------------------+--------+--------+------+----------------------+  Aorta Distal           96                                         +--------------------+--------+--------+------+----------------------+  Celiac Artery Origin  131                                         +--------------------+--------+--------+------+----------------------+  SMA Origin            138      18                                 +--------------------+--------+--------+------+----------------------+           +------------------+--------+--------+-------+  Right Renal ArteryPSV cm/sEDV cm/sComment  +------------------+--------+--------+-------+  Origin              95      29            +------------------+--------+--------+-------+   Proximal           181      60            +------------------+--------+--------+-------+  Mid                210      67            +------------------+--------+--------+-------+  Distal             132      39            +------------------+--------+--------+-------+   +-----------------+--------+--------+-------+  Left Renal ArteryPSV cm/sEDV cm/sComment  +-----------------+--------+--------+-------+  Origin            112      37            +-----------------+--------+--------+-------+  Proximal          172      58            +-----------------+--------+--------+-------+  Mid               183      61            +-----------------+--------+--------+-------+  Distal            192      60            +-----------------+--------+--------+-------+     Technologist observations: Incidental findings: Midline epigastric fluid  filled bowel with thickened wall, measuring .48 cm, ? etiology.   +------------+--------+--------+----+-----------+--------+--------+----+  Right KidneyPSV cm/sEDV cm/sRI  Left KidneyPSV cm/sEDV cm/sRI    +------------+--------+--------+----+-----------+--------+--------+----+  Upper Pole  34      11      0.66Upper Pole 39      14      0.65  +------------+--------+--------+----+-----------+--------+--------+----+  Mid        55      19      0.        23      9       0.59  +------------+--------+--------+----+-----------+--------+--------+----+  Lower Pole  22      8       0.64Lower Pole 21      7       0.69  +------------+--------+--------+----+-----------+--------+--------+----+  Hilar      78      29      0.62Hilar      93      32      0.66  +------------+--------+--------+----+-----------+--------+--------+----+   +------------------+-------+------------------+-------+  Right Kidney             Left Kidney                 +------------------+-------+------------------+-------+  RAR                     RAR                        +------------------+-------+------------------+-------+  RAR (manual)      1.75   RAR (manual)      1.60     +------------------+-------+------------------+-------+  Cortex           1.02 cmCortex            1.01 cm  +------------------+-------+------------------+-------+  Cortex thickness         Corex thickness            +------------------+-------+------------------+-------+  Kidney length (cm)10.77  Kidney length (cm)11.10    +------------------+-------+------------------+-------+     Summary:  Largest Aortic Diameter: 1.8 cm    Renal:    Right: Normal size right kidney. Normal right Resistive Index.         Normal cortical thickness of right kidney. 1-59% stenosis of         the right renal artery. RRV flow present.  Left:  Normal size of left kidney. Normal left Resistive Index.         Normal cortical thickness of the left kidney. 1-59% stenosis         of the  left renal artery. LRV flow present.  Mesenteric:  Normal Celiac artery and Superior Mesenteric artery findings.    Patent IVC.    Incidental findings: Midline epigastric fluid filled bowel with thickened  wall,  measuring .48 cm,  ? etiology.    Past Medical History:  Diagnosis Date   Anemia    Anxiety    Asthma    Dyspnea    Headache    Hypertension     Past Surgical History:  Procedure Laterality Date   CESAREAN SECTION N/A 12/08/2018   Procedure: CESAREAN SECTION;  Surgeon: Hermina Staggers, MD;  Location: [facility];  Service: Obstetrics;  Laterality: N/A;      OB History     Gravida  3   Para  1   Term      Preterm  1   AB  1   Living  0      SAB  1   IAB      Ectopic      Multiple  0   Live Births  1               Current Medications: No outpatient medications have been marked as taking for the 04/03/23 encounter (Office  Visit) with Thomasene Ripple, DO.     Allergies:   Patient has no known allergies.   Social History   Socioeconomic History   Marital status: Single    Spouse name: Not on file   Number of children: Not on file   Years of education: Not on file   Highest education level: Not on file  Occupational History   Occupation: Magazine features editor: [employer]  Tobacco Use   Smoking status: Never   Smokeless tobacco: Never  Vaping Use   Vaping Use: Never used  Substance and Sexual Activity   Alcohol use: No   Drug use: No   Sexual activity: Yes    Birth control/protection: None  Other Topics Concern   Not on file  Social History Narrative   Not on file   Social Determinants of Health   Financial Resource Strain: Low Risk  (09/18/2018)   Overall Financial Resource Strain (CARDIA)    Difficulty of Paying Living Expenses: Not hard at all  Food Insecurity: Unknown (09/18/2018)   Hunger Vital Sign    Worried About Running Out of Food in the Last Year: Never true    Ran Out of Food in the Last Year: Not on file  Transportation Needs: No Transportation Needs (09/18/2018)   PRAPARE - Administrator, Civil Service (Medical): No    Lack of Transportation (Non-Medical): No  Physical Activity: Not on file  Stress: Not on file  Social Connections: Not on file      Family History  Problem Relation Age of Onset   Cancer Maternal Grandmother    Diabetes Maternal Grandfather    Alcohol abuse Father    Drug abuse Father    Anemia Mother       ROS:   Please see the history of present illness.     All other systems reviewed and are negative.   Labs/EKG Reviewed:    EKG:   EKG is was ordered today.  The ekg ordered today demonstrates sinus rhythm.  Recent Labs: 03/20/2023: ALT 13; BUN 6; Creatinine, Ser 0.42; Hemoglobin 10.7; Platelets 220; Potassium 3.9; Sodium 135   Recent Lipid Panel No results found for: "CHOL", "TRIG", "HDL", "CHOLHDL", "LDLCALC",  "  LDLDIRECT"  Physical Exam:    VS:  BP 132/80 (BP Location: Left Arm, Patient Position: Sitting, Cuff Size: Normal)   Pulse 76   Ht 5' (1.524 m)   Wt 211 lb 6.4 oz (95.9 kg)   LMP 09/05/2022   SpO2 98%   BMI 41.29 kg/m     Wt Readings from Last 3 Encounters:  04/03/23 211 lb 1.6 oz (95.8 kg)  04/03/23 211 lb 6.4 oz (95.9 kg)  03/20/23 210 lb 9.6 oz (95.5 kg)     GEN:  Well nourished, well developed in no acute distress HEENT: Normal NECK: No JVD; No carotid bruits LYMPHATICS: No lymphadenopathy CARDIAC: RRR, no murmurs, rubs, gallops RESPIRATORY:  Clear to auscultation without rales, wheezing or rhonchi  ABDOMEN: Soft, non-tender, non-distended MUSCULOSKELETAL: Trace lower extremity bilateral edema; No deformity  SKIN: Warm and dry NEUROLOGIC:  Alert and oriented x 3 PSYCHIATRIC:  Normal affect    Risk Assessment/Risk Calculators:                 ASSESSMENT & PLAN:    Chronic hypertension in pregnancy Dyspnea on exertion Leg swelling Obesity in pregnancy. Renal Artery Stenosis  Her blood pressure was manually taken by me.  No changes today.  Will keep her on the labetalol 200 mg every 8 hours.  The patient is in agreement with the above plan. The patient left the office in stable condition.  The patient will follow up in 5 weeks.   Patient Instructions  Medication Instructions:  Your physician recommends that you continue on your current medications as directed. Please refer to the Current Medication list given to you today.  *If you need a refill on your cardiac medications before your next appointment, please call your pharmacy*   Lab Work: None   Testing/Procedures: None   Follow-Up: At [facility], you and your health needs are our priority.  As part of our continuing mission to provide you with exceptional heart care, we have created designated Provider Care Teams.  These Care Teams include your primary Cardiologist (physician) and  Advanced Practice Providers (APPs -  Physician Assistants and Nurse Practitioners) who all work together to provide you with the care you need, when you need it.  Your next appointment:   5 week(s)  Provider:   Thomasene Ripple, DO     Other instructions: Please take your blood pressure daily for 2 weeks and send in a MyChart message. Please include heart rates.   HOW TO TAKE YOUR BLOOD PRESSURE: Rest 5 minutes before taking your blood pressure. Don't smoke or drink caffeinated beverages for at least 30 minutes before. Take your blood pressure before (not after) you eat. Sit comfortably with your back supported and both feet on the floor (don't cross your legs). Elevate your arm to heart level on a table or a desk. Use the proper sized cuff. It should fit smoothly and snugly around your bare upper arm. There should be enough room to slip a fingertip under the cuff. The bottom edge of the cuff should be 1 inch above the crease of the elbow. Ideally, take 3 measurements at one sitting and record the average.    Dispo:  No follow-ups on file.   Medication Adjustments/Labs and Tests Ordered: Current medicines are reviewed at length with the patient today.  Concerns regarding medicines are outlined above.  Tests Ordered: No orders of the defined types were placed in this encounter.  Medication Changes: No orders of the defined types  were placed in this encounter.

## 2023-04-08 ENCOUNTER — Ambulatory Visit: Payer: Medicaid Other | Attending: Maternal & Fetal Medicine

## 2023-04-08 ENCOUNTER — Ambulatory Visit: Payer: Medicaid Other

## 2023-04-10 ENCOUNTER — Ambulatory Visit (HOSPITAL_BASED_OUTPATIENT_CLINIC_OR_DEPARTMENT_OTHER): Payer: Medicaid Other | Admitting: *Deleted

## 2023-04-10 ENCOUNTER — Encounter: Payer: Self-pay | Admitting: *Deleted

## 2023-04-10 ENCOUNTER — Ambulatory Visit: Payer: Medicaid Other | Attending: Maternal & Fetal Medicine | Admitting: *Deleted

## 2023-04-10 VITALS — BP 133/75 | HR 75

## 2023-04-10 DIAGNOSIS — O0990 Supervision of high risk pregnancy, unspecified, unspecified trimester: Secondary | ICD-10-CM

## 2023-04-10 DIAGNOSIS — Z3A31 31 weeks gestation of pregnancy: Secondary | ICD-10-CM | POA: Diagnosis not present

## 2023-04-10 DIAGNOSIS — O99213 Obesity complicating pregnancy, third trimester: Secondary | ICD-10-CM | POA: Insufficient documentation

## 2023-04-10 DIAGNOSIS — Z3A30 30 weeks gestation of pregnancy: Secondary | ICD-10-CM | POA: Diagnosis not present

## 2023-04-10 DIAGNOSIS — O10913 Unspecified pre-existing hypertension complicating pregnancy, third trimester: Secondary | ICD-10-CM | POA: Diagnosis not present

## 2023-04-10 DIAGNOSIS — O99210 Obesity complicating pregnancy, unspecified trimester: Secondary | ICD-10-CM

## 2023-04-10 DIAGNOSIS — O34219 Maternal care for unspecified type scar from previous cesarean delivery: Secondary | ICD-10-CM

## 2023-04-10 NOTE — Procedures (Signed)
Dawn Hoffman 1991/01/09 [redacted]w[redacted]d  Fetus A Non-Stress Test Interpretation for 04/10/23  Indication: Chronic Hypertenstion  Fetal Heart Rate A Mode: External Baseline Rate (A): 140 bpm Variability: Moderate Accelerations: 10 x 10 Decelerations: None Multiple birth?: No  Uterine Activity Mode: Toco Contraction Frequency (min): none Resting Tone Palpated: Relaxed  Interpretation (Fetal Testing) Nonstress Test Interpretation: Reactive Overall Impression: Reassuring for gestational age Comments: Tracing reviewed by Dr. Judeth Cornfield

## 2023-04-15 ENCOUNTER — Ambulatory Visit: Payer: Medicaid Other | Admitting: *Deleted

## 2023-04-15 ENCOUNTER — Ambulatory Visit: Payer: Medicaid Other | Attending: Maternal & Fetal Medicine | Admitting: *Deleted

## 2023-04-15 VITALS — BP 135/69 | HR 78

## 2023-04-15 DIAGNOSIS — O0990 Supervision of high risk pregnancy, unspecified, unspecified trimester: Secondary | ICD-10-CM

## 2023-04-15 DIAGNOSIS — Z3A31 31 weeks gestation of pregnancy: Secondary | ICD-10-CM | POA: Insufficient documentation

## 2023-04-15 DIAGNOSIS — O34219 Maternal care for unspecified type scar from previous cesarean delivery: Secondary | ICD-10-CM

## 2023-04-15 DIAGNOSIS — O26893 Other specified pregnancy related conditions, third trimester: Secondary | ICD-10-CM | POA: Insufficient documentation

## 2023-04-15 DIAGNOSIS — O10913 Unspecified pre-existing hypertension complicating pregnancy, third trimester: Secondary | ICD-10-CM

## 2023-04-15 DIAGNOSIS — O10919 Unspecified pre-existing hypertension complicating pregnancy, unspecified trimester: Secondary | ICD-10-CM

## 2023-04-15 DIAGNOSIS — O99210 Obesity complicating pregnancy, unspecified trimester: Secondary | ICD-10-CM

## 2023-04-15 NOTE — Procedures (Signed)
Dawn Hoffman 1991/05/22 [redacted]w[redacted]d  Fetus A Non-Stress Test Interpretation for 04/15/23  Indication: Chronic Hypertenstion  Fetal Heart Rate A Mode: External Baseline Rate (A): 140 bpm Variability: Moderate Accelerations: 15 x 15 Decelerations: None Multiple birth?: No  Uterine Activity Mode: Palpation, Toco Contraction Frequency (min): None  Interpretation (Fetal Testing) Nonstress Test Interpretation: Reactive Comments: Dr. Parke Poisson reviewed tracing.

## 2023-04-22 ENCOUNTER — Ambulatory Visit
Payer: Medicaid Other | Attending: Cardiovascular Disease | Admitting: Pharmacist Clinician (PhC)/ Clinical Pharmacy Specialist

## 2023-04-22 ENCOUNTER — Encounter: Payer: Self-pay | Admitting: Pharmacist Clinician (PhC)/ Clinical Pharmacy Specialist

## 2023-04-22 VITALS — BP 132/86 | HR 72

## 2023-04-22 DIAGNOSIS — O10913 Unspecified pre-existing hypertension complicating pregnancy, third trimester: Secondary | ICD-10-CM | POA: Diagnosis not present

## 2023-04-22 DIAGNOSIS — Z3A30 30 weeks gestation of pregnancy: Secondary | ICD-10-CM

## 2023-04-22 NOTE — Patient Instructions (Signed)
Follow up appointment: with Dr. Servando Salina on July 8 @ 3:30 pm  Take your BP meds as follows: no changes in medication today  Check your blood pressure at home daily (if able) and keep record of the readings.  Hypertension "High blood pressure"  Hypertension is often called "The Silent Killer." It rarely causes symptoms until it is extremely  high or has done damage to other organs in the body. For this reason, you should have your  blood pressure checked regularly by your physician. We will check your blood pressure  every time you see a provider at one of our offices.   Your blood pressure reading consists of two numbers. Ideally, blood pressure should be  below 120/80. The first ("top") number is called the systolic pressure. It measures the  pressure in your arteries as your heart beats. The second ("bottom") number is called the diastolic pressure. It measures the pressure in your arteries as the heart relaxes between beats.  The benefits of getting your blood pressure under control are enormous. A 10-point  reduction in systolic blood pressure can reduce your risk of stroke by 27% and heart failure by 28%  To check your pressure at home you will need to:  1. Sit up in a chair, with feet flat on the floor and back supported. Do not cross your ankles or legs. 2. Rest your left arm so that the cuff is about heart level. If the cuff goes on your upper arm,  then just relax the arm on the table, arm of the chair or your lap. If you have a wrist cuff, we  suggest relaxing your wrist against your chest (think of it as Pledging the Flag with the  wrong arm).  3. Place the cuff snugly around your arm, about 1 inch above the crook of your elbow. The  cords should be inside the groove of your elbow.  4. Sit quietly, with the cuff in place, for about 5 minutes. After that 5 minutes press the power  button to start a reading. 5. Do not talk or move while the reading is taking place.  6. Record  your readings on a sheet of paper. Although most cuffs have a memory, it is often  easier to see a pattern developing when the numbers are all in front of you.  7. You can repeat the reading after 1-3 minutes if it is recommended  Make sure your bladder is empty and you have not had caffeine or tobacco within the last 30 min  Always bring your blood pressure log with you to your appointments. If you have not brought your monitor in to be double checked for accuracy, please bring it to your next appointment.  You can find a list of quality blood pressure cuffs at validatebp.org

## 2023-04-22 NOTE — Assessment & Plan Note (Signed)
Assessment: BP is controlled in office BP 132/86 mmHg;  . Tolerates labetalol 200 mg bid well without any side effects Feeling well today  Plan:  Continue taking labetalol 200 mg bid Patient to keep record of BP readings with heart rate and report to [place] at the next visit Patient to follow up with Dr. Servando Salina on July 8

## 2023-04-22 NOTE — Progress Notes (Unsigned)
Office Visit    Patient Name: Dawn Hoffman Date of Encounter: 04/22/2023  Primary Care Provider:  Patient, No Pcp Per Primary Cardiologist:  Thomasene Ripple, DO  Chief Complaint    Hypertension - chronic hypertension in pregnancy  Significant Past Medical History   RAS 1-59% bilateral  pregnancy [redacted]w[redacted]d today    No Known Allergies  History of Present Illness    Dawn Hoffman is a 32 y.o. female patient of Dr Servando Salina, in the office today for hypertension in pregnancy follow up.   She was most recently seen by Dr. Servando Salina at [facility] with a BP of 132/80.  Patient reports first being diagnosed with hypertension at age 33.  She has known RAS.  Was on meds when diagnosed, but had spells where it was controlled.   Before pregnancy, on hctz 12. 5 mg, on olmesartan prior.  Last pregnancy nifedipine didn't seem to help, but also had a complicated pregnancy.  Today she is in the office for BP check.  She did not bring home readings with her.  States she is feeling well, although tired.  No concerns with labetalol.    Blood Pressure Goal:  130/80  Current Medications:  labetalol 200 mg bid (8 am, 4 pm)  Family Hx:   father unknown, mother deceased, 2 siblings neither with hypertension  Social Hx:      Tobacco: no  Alcohol: no  Caffeine: no  Diet:    trying to get veggies and protein; snack is cracker; some juice or tea, mostly water  Exercise: walking 15 min intervals,   Home BP readings:  no readings with her today, notes highest home diastolic at 85, all within 75-85;  systolic almost all in 130's, nothing above 140, only a few in 120's.    Accessory Clinical Findings    Lab Results  Component Value Date   CREATININE 0.42 (L) 03/20/2023   BUN 6 03/20/2023   NA 135 03/20/2023   K 3.9 03/20/2023   CL 103 03/20/2023   CO2 19 (L) 03/20/2023   Lab Results  Component Value Date   ALT 13 03/20/2023   AST 13 03/20/2023   ALKPHOS 41 (L) 03/20/2023   BILITOT <0.2 03/20/2023    Lab Results  Component Value Date   HGBA1C 5.7 (H) 11/08/2022    Home Medications    Current Outpatient Medications  Medication Sig Dispense Refill   aspirin EC 81 MG tablet Take 1 tablet (81 mg total) by mouth daily. Take after 12 weeks for prevention of preeclampsia later in pregnancy 300 tablet 2   Blood Pressure Monitoring (BLOOD PRESSURE KIT) DEVI 1 Device by Does not apply route once a week. 1 each 0   ferrous sulfate 324 (65 Fe) MG TBEC Take 1 tablet (325 mg total) by mouth every other day. 30 tablet 5   labetalol (NORMODYNE) 200 MG tablet Take 1 tablet (200 mg total) by mouth 2 (two) times daily. 60 tablet 5   loratadine (CLARITIN) 10 MG tablet Take 1 tablet (10 mg total) by mouth daily. 30 tablet 11   Prenatal Vit-Fe Fumarate-FA (PRENATAL VITAMIN PO) Take 1 tablet by mouth daily.     fexofenadine (ALLEGRA) 180 MG tablet Take 180 mg by mouth daily. (Patient not taking: Reported on 04/22/2023)     montelukast (SINGULAIR) 10 MG tablet Take 10 mg by mouth as needed. (Patient not taking: Reported on 04/22/2023)     No current facility-administered medications for this visit.  Assessment & Plan    Chronic hypertension in obstetric context in third trimester Assessment: BP is controlled in office BP 132/86 mmHg;  . Tolerates labetalol 200 mg bid well without any side effects Feeling well today  Plan:  Continue taking labetalol 200 mg bid Patient to keep record of BP readings with heart rate and report to [place] at the next visit Patient to follow up with Dr. Servando Salina on July 8    Phillips Hay PharmD [facility]  [address] [place], [place] [address] [phone]

## 2023-04-23 ENCOUNTER — Ambulatory Visit: Payer: Medicaid Other | Attending: Maternal & Fetal Medicine

## 2023-04-23 ENCOUNTER — Encounter: Payer: Self-pay | Admitting: *Deleted

## 2023-04-23 ENCOUNTER — Ambulatory Visit: Payer: Medicaid Other | Admitting: *Deleted

## 2023-04-23 VITALS — BP 132/81 | HR 82

## 2023-04-23 DIAGNOSIS — E669 Obesity, unspecified: Secondary | ICD-10-CM

## 2023-04-23 DIAGNOSIS — O10913 Unspecified pre-existing hypertension complicating pregnancy, third trimester: Secondary | ICD-10-CM | POA: Insufficient documentation

## 2023-04-23 DIAGNOSIS — O10013 Pre-existing essential hypertension complicating pregnancy, third trimester: Secondary | ICD-10-CM | POA: Diagnosis not present

## 2023-04-23 DIAGNOSIS — O0990 Supervision of high risk pregnancy, unspecified, unspecified trimester: Secondary | ICD-10-CM | POA: Insufficient documentation

## 2023-04-23 DIAGNOSIS — Z3A32 32 weeks gestation of pregnancy: Secondary | ICD-10-CM

## 2023-04-23 DIAGNOSIS — O34219 Maternal care for unspecified type scar from previous cesarean delivery: Secondary | ICD-10-CM

## 2023-04-23 DIAGNOSIS — Z8759 Personal history of other complications of pregnancy, childbirth and the puerperium: Secondary | ICD-10-CM | POA: Diagnosis present

## 2023-04-23 DIAGNOSIS — O99213 Obesity complicating pregnancy, third trimester: Secondary | ICD-10-CM | POA: Diagnosis not present

## 2023-04-23 DIAGNOSIS — O09213 Supervision of pregnancy with history of pre-term labor, third trimester: Secondary | ICD-10-CM

## 2023-04-24 ENCOUNTER — Ambulatory Visit (INDEPENDENT_AMBULATORY_CARE_PROVIDER_SITE_OTHER): Payer: Medicaid Other | Admitting: Obstetrics and Gynecology

## 2023-04-24 VITALS — BP 132/88 | HR 84 | Wt 214.9 lb

## 2023-04-24 DIAGNOSIS — O0990 Supervision of high risk pregnancy, unspecified, unspecified trimester: Secondary | ICD-10-CM

## 2023-04-24 DIAGNOSIS — A6000 Herpesviral infection of urogenital system, unspecified: Secondary | ICD-10-CM

## 2023-04-24 DIAGNOSIS — J45909 Unspecified asthma, uncomplicated: Secondary | ICD-10-CM

## 2023-04-24 DIAGNOSIS — O09299 Supervision of pregnancy with other poor reproductive or obstetric history, unspecified trimester: Secondary | ICD-10-CM

## 2023-04-24 DIAGNOSIS — O34219 Maternal care for unspecified type scar from previous cesarean delivery: Secondary | ICD-10-CM

## 2023-04-24 DIAGNOSIS — O10913 Unspecified pre-existing hypertension complicating pregnancy, third trimester: Secondary | ICD-10-CM

## 2023-04-24 DIAGNOSIS — Z3A33 33 weeks gestation of pregnancy: Secondary | ICD-10-CM

## 2023-04-24 NOTE — Progress Notes (Signed)
Pt reports fetal movement with some pressure. 

## 2023-04-24 NOTE — Progress Notes (Signed)
PRENATAL VISIT NOTE  Subjective:  Dawn Hoffman is a 32 y.o. G3P0110 at [redacted]w[redacted]d being seen today for ongoing prenatal care.  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  Patient reports no complaints.  Contractions: Not present. Vag. Bleeding: None.  Movement: Present. Denies leaking of fluid.   The following portions of the patient's history were reviewed and updated as appropriate: allergies, current medications, past family history, past medical history, past social history, past surgical history and problem list.   Objective:   Vitals:   04/24/23 0839  BP: 132/88  Pulse: 84  Weight: 214 lb 14.4 oz (97.5 kg)    Fetal Status: Fetal Heart Rate (bpm): 138   Movement: Present     General:  Alert, oriented and cooperative. Patient is in no acute distress.  Skin: Skin is warm and dry. No rash noted.   Cardiovascular: Normal heart rate noted  Respiratory: Normal respiratory effort, no problems with respiration noted  Abdomen: Soft, gravid, appropriate for gestational age.  Pain/Pressure: Present      Assessment and Plan:  Pregnancy: G3P0110 at [redacted]w[redacted]d 1. Supervision of high risk pregnancy, antepartum 2. [redacted] weeks gestation of pregnancy Discussed GBS next appt No longer interested in BTL, considering MOC options  3. 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 Following w/ cardiology this pregnancy. Next appt 7/8 Normal baseline labs, normal echo 12/27/22 Reports home Bps 130s/70-80s, normotensive this AM  Continue labetalol 200 BID Growth [place]  6/18: @32 /6 2060g (40%), AC 47%, AFI 14.5, anterior, breech Continue weekly BPPs, serial growth Delivery at 39w  4. Previous cesarean delivery affecting pregnancy, antepartum - We discussed her history of c-section. Her previous c-section was due to  non-reassuring fetal heart tones.  She has a history of  no prior successful vaginal deliveries - We discussed the risks associated with repeat c-section: bleeding, infection, injury to surrounding organs/tissues I.e. bowel/bladder, development of scar tissue, wound complications such as wound separation or infection, need for additional surgery, placental abnormalities with subsequent pregnancies - We discussed the risks associated with TOLAC: risk of it being unsuccessful, specially in the context of her history, the risks in general of a vaginal delivery including perineal tears, and the risk of uterine rupture. We discussed with the risk of uterine rupture that while rare it is not easily predicted, that it is a surgical emergency, and it can be potentially catastrophic for mom and baby. We discussed if uterine rupture that it may necessitate hysterectomy if the rupture caused issues with bleeding that could not be managed with other surgical options.  - Discussed that she can change her mind at any time - After counseling, the patient was given the opportunity to ask questions and all questions answered.  - After considering her options, she would like to [facility] - Information provided to the patient. She will bring signed consent to her next appointment.   5. Genital herpes simplex, unspecified site Discussed suppression to start at next appt (35-36w)  6. Pregnancy with poor obstetric history (neonatal demise at 29 weeks due to ICH)  7. Uncomplicated asthma, unspecified asthma severity, unspecified whether persistent No complaints  Please refer to After Visit Summary for other counseling recommendations.   Return in about 2 weeks (around  05/08/2023) for return OB at 35 weeks with GBS/GC/CT.  Future Appointments  Date Time Provider Department Center  04/29/2023  8:30 AM [facility] NURSE [facility]  04/29/2023  8:45 AM [facility] NST [facility]  05/06/2023  8:30 AM [facility] NURSE [facility]  05/06/2023  8:45 AM [facility] NST [facility]  05/08/2023  2:50 PM Corlis Hove, NP [facility] None  05/13/2023  9:30 AM [facility] NURSE [facility]  05/13/2023  9:45 AM [facility] NST [facility]  05/13/2023  3:30 PM Tobb, Kardie, DO [facility] None  05/15/2023  1:50 PM Lennart Pall, MD [facility] None  05/22/2023  9:35 AM Lanae Crumbly, Lahoma Crocker, DO [facility] None  05/29/2023  8:35 AM Lennart Pall, MD [facility] None  06/05/2023  8:35 AM Sue Lush, FNP [facility] None  06/12/2023  8:35 AM Adam Phenix, MD [facility] None   Lennart Pall, MD

## 2023-04-29 ENCOUNTER — Ambulatory Visit: Payer: Medicaid Other

## 2023-04-29 ENCOUNTER — Ambulatory Visit: Payer: Medicaid Other | Attending: Maternal & Fetal Medicine

## 2023-05-06 ENCOUNTER — Ambulatory Visit: Payer: Medicaid Other

## 2023-05-07 ENCOUNTER — Ambulatory Visit: Payer: Medicaid Other | Attending: Maternal & Fetal Medicine

## 2023-05-07 ENCOUNTER — Ambulatory Visit: Payer: Medicaid Other

## 2023-05-08 ENCOUNTER — Ambulatory Visit (INDEPENDENT_AMBULATORY_CARE_PROVIDER_SITE_OTHER): Payer: Medicaid Other | Admitting: Student

## 2023-05-08 VITALS — BP 134/88 | HR 80 | Wt 224.5 lb

## 2023-05-08 DIAGNOSIS — O0990 Supervision of high risk pregnancy, unspecified, unspecified trimester: Secondary | ICD-10-CM

## 2023-05-08 DIAGNOSIS — O0993 Supervision of high risk pregnancy, unspecified, third trimester: Secondary | ICD-10-CM

## 2023-05-08 DIAGNOSIS — O10913 Unspecified pre-existing hypertension complicating pregnancy, third trimester: Secondary | ICD-10-CM

## 2023-05-08 DIAGNOSIS — Z3A35 35 weeks gestation of pregnancy: Secondary | ICD-10-CM

## 2023-05-08 DIAGNOSIS — O34219 Maternal care for unspecified type scar from previous cesarean delivery: Secondary | ICD-10-CM

## 2023-05-08 DIAGNOSIS — O99013 Anemia complicating pregnancy, third trimester: Secondary | ICD-10-CM

## 2023-05-08 DIAGNOSIS — A6000 Herpesviral infection of urogenital system, unspecified: Secondary | ICD-10-CM

## 2023-05-08 MED ORDER — VALACYCLOVIR HCL 500 MG PO TABS
500.0000 mg | ORAL_TABLET | Freq: Two times a day (BID) | ORAL | 6 refills | Status: DC
Start: 2023-05-08 — End: 2023-05-27

## 2023-05-08 NOTE — Progress Notes (Signed)
Pt reports fetal movement with some pressure. 

## 2023-05-08 NOTE — Progress Notes (Signed)
   PRENATAL VISIT NOTE  Subjective:  Dawn Hoffman is a 32 y.o. G3P0110 at [redacted]w[redacted]d being seen today for ongoing prenatal care.  She is currently monitored for the following issues for this high-risk pregnancy and has ASTHMA, UNSPECIFIED; Supervision of high risk pregnancy, antepartum; Chronic hypertension in obstetric context in third trimester; Previous cesarean delivery affecting pregnancy, antepartum; Obesity affecting pregnancy, antepartum; Genital herpes simplex; and Pregnancy with poor obstetric history (neonatal demise at 78 weeks due to ICH) on their problem list.  Patient reports no complaints.  Contractions: Not present. Vag. Bleeding: None.  Movement: Present. Denies leaking of fluid.   The following portions of the patient's history were reviewed and updated as appropriate: allergies, current medications, past family history, past medical history, past social history, past surgical history and problem list.   Objective:   Vitals:   05/08/23 1514  BP: 134/88  Pulse: 80  Weight: 224 lb 8 oz (101.8 kg)    Fetal Status: Fetal Heart Rate (bpm): 145   Movement: Present     General:  Alert, oriented and cooperative. Patient is in no acute distress.  Skin: Skin is warm and dry. No rash noted.   Cardiovascular: Normal heart rate noted  Respiratory: Normal respiratory effort, no problems with respiration noted  Abdomen: Soft, gravid, appropriate for gestational age.  Pain/Pressure: Present     Pelvic: Cervical exam deferred        Extremities: Normal range of motion.  Edema: Trace  Mental Status: Normal mood and affect. Normal behavior. Normal judgment and thought content.   Assessment and Plan:  Pregnancy: G3P0110 at [redacted]w[redacted]d 1. Supervision of high risk pregnancy, antepartum - frequent and vigorous fetal movement   2. [redacted] weeks gestation of pregnancy - prefers swabs at next visit  3. Previous cesarean delivery affecting pregnancy, antepartum - desires TOLAC - Consent  signed today  4. Chronic hypertension in obstetric context in third trimester - Labetalol BID  - Stable, normotensive today - followed by cardiology , next appointment is 07/08 - weekly BPPs and serial growth, next appointment is 07/08 - delivery @39  weeks  5. Genital herpes simplex, unspecified site - start suppressive therapy   6. Anemia during pregnancy in third trimester - PO iron  Preterm labor symptoms and general obstetric precautions including but not limited to vaginal bleeding, contractions, leaking of fluid and fetal movement were reviewed in detail with the patient. Please refer to After Visit Summary for other counseling recommendations.   Return in about 1 week (around 05/15/2023) for LOB/GBS, IN-PERSON.  Future Appointments  Date Time Provider Department Center  05/13/2023  9:30 AM [facility] NURSE [facility]  05/13/2023  9:45 AM [facility] NST [facility]  05/13/2023  3:30 PM Tobb, Kardie, DO [facility] None  05/15/2023  1:50 PM Lennart Pall, MD [facility] None  05/22/2023  9:35 AM Lanae Crumbly, Lahoma Crocker, DO [facility] None  05/29/2023  8:35 AM Lennart Pall, MD [facility] None  06/05/2023  8:35 AM Sue Lush, FNP [facility] None  06/12/2023  8:35 AM Adam Phenix, MD [facility] None    Corlis Hove, NP

## 2023-05-13 ENCOUNTER — Ambulatory Visit (INDEPENDENT_AMBULATORY_CARE_PROVIDER_SITE_OTHER): Payer: Medicaid Other | Admitting: Cardiology

## 2023-05-13 ENCOUNTER — Ambulatory Visit: Payer: Medicaid Other | Attending: Maternal & Fetal Medicine | Admitting: *Deleted

## 2023-05-13 ENCOUNTER — Encounter: Payer: Self-pay | Admitting: Cardiology

## 2023-05-13 ENCOUNTER — Ambulatory Visit: Payer: Medicaid Other | Admitting: *Deleted

## 2023-05-13 VITALS — BP 126/72 | HR 84 | Ht 60.0 in | Wt 222.4 lb

## 2023-05-13 VITALS — BP 122/64 | HR 87

## 2023-05-13 DIAGNOSIS — Z3A35 35 weeks gestation of pregnancy: Secondary | ICD-10-CM

## 2023-05-13 DIAGNOSIS — O10913 Unspecified pre-existing hypertension complicating pregnancy, third trimester: Secondary | ICD-10-CM | POA: Diagnosis not present

## 2023-05-13 DIAGNOSIS — O99213 Obesity complicating pregnancy, third trimester: Secondary | ICD-10-CM | POA: Diagnosis not present

## 2023-05-13 DIAGNOSIS — O10919 Unspecified pre-existing hypertension complicating pregnancy, unspecified trimester: Secondary | ICD-10-CM

## 2023-05-13 DIAGNOSIS — D509 Iron deficiency anemia, unspecified: Secondary | ICD-10-CM

## 2023-05-13 DIAGNOSIS — O34219 Maternal care for unspecified type scar from previous cesarean delivery: Secondary | ICD-10-CM

## 2023-05-13 DIAGNOSIS — Z79899 Other long term (current) drug therapy: Secondary | ICD-10-CM | POA: Diagnosis not present

## 2023-05-13 DIAGNOSIS — E669 Obesity, unspecified: Secondary | ICD-10-CM | POA: Insufficient documentation

## 2023-05-13 DIAGNOSIS — O0990 Supervision of high risk pregnancy, unspecified, unspecified trimester: Secondary | ICD-10-CM

## 2023-05-13 DIAGNOSIS — O99210 Obesity complicating pregnancy, unspecified trimester: Secondary | ICD-10-CM

## 2023-05-13 MED ORDER — POTASSIUM CHLORIDE CRYS ER 20 MEQ PO TBCR: 20.0000 meq | EXTENDED_RELEASE_TABLET | Freq: Every day | ORAL | 0 refills | Status: DC

## 2023-05-13 MED ORDER — FUROSEMIDE 40 MG PO TABS
40.0000 mg | ORAL_TABLET | Freq: Every day | ORAL | 0 refills | Status: DC
Start: 2023-05-13 — End: 2023-05-23

## 2023-05-13 NOTE — Procedures (Signed)
Dawn Hoffman January 08, 1991 [redacted]w[redacted]d  Fetus A Non-Stress Test Interpretation for 05/13/23  Indication: Chronic Hypertenstion and Obesity (N[facility] only)  Fetal Heart Rate A Mode: External Baseline Rate (A): 145 bpm Variability: Moderate Accelerations: 15 x 15 Decelerations: None Multiple birth?: No  Uterine Activity Mode: Palpation, Toco Contraction Frequency (min): Occas Contraction Quality: Mild Resting Tone Palpated: Relaxed Resting Time: Adequate  Interpretation (Fetal Testing) Nonstress Test Interpretation: Reactive Comments: Dr. Parke Poisson reviewed tracing

## 2023-05-13 NOTE — Progress Notes (Unsigned)
Cardio-Obstetrics Clinic  New Evaluation  Date:  05/13/2023   ID:  Dawn Hoffman, DOB 1990/12/25, MRN [id number]  PCP:  Patient, No Pcp Per   [facility] Providers Cardiologist:  Thomasene Ripple, DO  Electrophysiologist:  None       Referring MD: Hermina Staggers, MD   Chief Complaint: " I am doing well"  History of Present Illness:    Dawn Hoffman is a 32 y.o. female [G3P0110] who is being seen today for follow-up for chronic hypertension in pregnancy.  Medical history includes renal artery stenosis seen on recent renal US, chronic hypertension in pregnancy, anxiety,   She is here for follow-up.  She is currently 35 weeks and 5 days.  She reports that she is experiencing worsening leg edema and has been consistent.  No other complaints at this time.  Prior CV Studies Reviewed: The following studies were reviewed today:  TTE 12/2022 IMPRESSIONS    1. Left ventricular ejection fraction, by estimation, is 60 to 65%. The  left ventricle has normal function. The left ventricle has no regional  wall motion abnormalities. Left ventricular diastolic parameters were  normal. The average left ventricular  global longitudinal strain is -22.0 %. The global longitudinal strain is  normal.   2. Right ventricular systolic function is normal. The right ventricular  size is normal.   3. The mitral valve is normal in structure. Trivial mitral valve  regurgitation. No evidence of mitral stenosis.   4. The aortic valve is normal in structure. Aortic valve regurgitation is  not visualized. No aortic stenosis is present.   5. The inferior vena cava is normal in size with greater than 50%  respiratory variability, suggesting right atrial pressure of 3 mmHg.   Comparison(s): No prior Echocardiogram.   Conclusion(s)/Recommendation(s): Normal biventricular function without  evidence of hemodynamically significant valvular heart disease.   FINDINGS   Left Ventricle:  Left ventricular ejection fraction, by estimation, is 60  to 65%. The left ventricle has normal function. The left ventricle has no  regional wall motion abnormalities. The average left ventricular global  longitudinal strain is -22.0 %.  The global longitudinal strain is normal. The left ventricular internal  cavity size was normal in size. There is no left ventricular hypertrophy.  Left ventricular diastolic parameters were normal.   Right Ventricle: The right ventricular size is normal. Right ventricular  systolic function is normal.   Left Atrium: Left atrial size was normal in size.   Right Atrium: Right atrial size was normal in size.   Pericardium: There is no evidence of pericardial effusion.   Mitral Valve: The mitral valve is normal in structure. Trivial mitral  valve regurgitation. No evidence of mitral valve stenosis.   Tricuspid Valve: The tricuspid valve is normal in structure. Tricuspid  valve regurgitation is trivial. No evidence of tricuspid stenosis.   Aortic Valve: The aortic valve is normal in structure. Aortic valve  regurgitation is not visualized. No aortic stenosis is present.   Pulmonic Valve: The pulmonic valve was normal in structure. Pulmonic valve  regurgitation is trivial. No evidence of pulmonic stenosis.   Aorta: The aortic root is normal in size and structure.   Venous: The inferior vena cava is normal in size with greater than 50%  respiratory variability, suggesting right atrial pressure of 3 mmHg.   IAS/Shunts: No atrial level shunt detected by color flow Doppler.   Doppler US 12/20/2022 Examination Guidelines: A complete evaluation includes B-mode imaging,  spectral  Doppler, color Doppler, and power Doppler as needed of all accessible  portions  of each vessel. Bilateral testing is considered an integral part of a  complete  examination. Limited examinations for reoccurring indications may be  performed  as noted.     Duplex  Findings:  +--------------------+--------+--------+------+----------------------+  Mesenteric         PSV cm/sEDV cm/sPlaque       Comments         +--------------------+--------+--------+------+----------------------+  Aorta Prox            120                 1.7 cm AP x 1.8 cm TRV  +--------------------+--------+--------+------+----------------------+  Aorta Distal           96                                         +--------------------+--------+--------+------+----------------------+  Celiac Artery Origin  131                                         +--------------------+--------+--------+------+----------------------+  SMA Origin            138      18                                 +--------------------+--------+--------+------+----------------------+           +------------------+--------+--------+-------+  Right Renal ArteryPSV cm/sEDV cm/sComment  +------------------+--------+--------+-------+  Origin              95      29            +------------------+--------+--------+-------+  Proximal           181      60            +------------------+--------+--------+-------+  Mid                210      67            +------------------+--------+--------+-------+  Distal             132      39            +------------------+--------+--------+-------+   +-----------------+--------+--------+-------+  Left Renal ArteryPSV cm/sEDV cm/sComment  +-----------------+--------+--------+-------+  Origin            112      37            +-----------------+--------+--------+-------+  Proximal          172      58            +-----------------+--------+--------+-------+  Mid               183      61            +-----------------+--------+--------+-------+  Distal            192      60            +-----------------+--------+--------+-------+     Technologist observations: Incidental findings:  Midline epigastric fluid  filled bowel with thickened wall, measuring .48 cm, ? etiology.   +------------+--------+--------+----+-----------+--------+--------+----+  Right KidneyPSV cm/sEDV cm/sRI  Left KidneyPSV cm/sEDV cm/sRI    +------------+--------+--------+----+-----------+--------+--------+----+  Upper Pole  34      11      0.66Upper Pole 39      14      0.65  +------------+--------+--------+----+-----------+--------+--------+----+  Mid        55      19      0.        23      9       0.59  +------------+--------+--------+----+-----------+--------+--------+----+  Lower Pole  22      8       0.64Lower Pole 21      7       0.69  +------------+--------+--------+----+-----------+--------+--------+----+  Hilar      78      29      0.62Hilar      93      32      0.66  +------------+--------+--------+----+-----------+--------+--------+----+   +------------------+-------+------------------+-------+  Right Kidney             Left Kidney                +------------------+-------+------------------+-------+  RAR                     RAR                        +------------------+-------+------------------+-------+  RAR (manual)      1.75   RAR (manual)      1.60     +------------------+-------+------------------+-------+  Cortex           1.02 cmCortex            1.01 cm  +------------------+-------+------------------+-------+  Cortex thickness         Corex thickness            +------------------+-------+------------------+-------+  Kidney length (cm)10.77  Kidney length (cm)11.10    +------------------+-------+------------------+-------+     Summary:  Largest Aortic Diameter: 1.8 cm    Renal:    Right: Normal size right kidney. Normal right Resistive Index.         Normal cortical thickness of right kidney. 1-59% stenosis of         the right renal artery. RRV flow present.  Left:  Normal size of left  kidney. Normal left Resistive Index.         Normal cortical thickness of the left kidney. 1-59% stenosis         of the left renal artery. LRV flow present.  Mesenteric:  Normal Celiac artery and Superior Mesenteric artery findings.    Patent IVC.    Incidental findings: Midline epigastric fluid filled bowel with thickened  wall,  measuring .48 cm,  ? etiology.    Past Medical History:  Diagnosis Date   Abnormal fetal ultrasound 11/10/2018   Anemia    Anxiety    Asthma    Dyspnea    Headache    Hypertension    HYPERTENSION, BENIGN SYSTEMIC 01/02/2007   Qualifier: Diagnosis of   By: Doneen Poisson, UNSPEC., W/O INTRACTABLE MIGRAINE 01/02/2007   Qualifier: Diagnosis of   By: Levada Schilling      Replacing diagnoses that were inactivated after the 02/04/23 regulatory import   RHINITIS, ALLERGIC 01/02/2007   Qualifier: Diagnosis of   By: Levada Schilling      Replacing diagnoses that were inactivated after the 02/04/23 regulatory import  Past Surgical History:  Procedure Laterality Date   CESAREAN SECTION N/A 12/08/2018   Procedure: CESAREAN SECTION;  Surgeon: Hermina Staggers, MD;  Location: [facility] BIRTHING SUIT[facility];  Service: Obstetrics;  Laterality: N/A;      OB History     Gravida  3   Para  1   Term      Preterm  1   AB  1   Living  0      SAB  1   IAB      Ectopic      Multiple  0   Live Births  1               Current Medications: Current Meds  Medication Sig   aspirin EC 81 MG tablet Take 1 tablet (81 mg total) by mouth daily. Take after 12 weeks for prevention of preeclampsia later in pregnancy   Blood Pressure Monitoring (BLOOD PRESSURE KIT) DEVI 1 Device by Does not apply route once a week.   ferrous sulfate 324 (65 Fe) MG TBEC Take 1 tablet (325 mg total) by mouth every other day.   labetalol (NORMODYNE) 200 MG tablet Take 1 tablet (200 mg total) by mouth 2 (two) times daily.   loratadine (CLARITIN) 10 MG tablet Take 1 tablet (10 mg  total) by mouth daily.   Prenatal Vit-Fe Fumarate-FA (PRENATAL VITAMIN PO) Take 1 tablet by mouth daily.   valACYclovir (VALTREX) 500 MG tablet Take 1 tablet (500 mg total) by mouth 2 (two) times daily.     Allergies:   Patient has no known allergies.   Social History   Socioeconomic History   Marital status: Single    Spouse name: Not on file   Number of children: Not on file   Years of education: Not on file   Highest education level: Not on file  Occupational History   Occupation: Magazine features editor: [employer]  Tobacco Use   Smoking status: Never   Smokeless tobacco: Never  Vaping Use   Vaping Use: Never used  Substance and Sexual Activity   Alcohol use: No   Drug use: No   Sexual activity: Yes    Birth control/protection: None  Other Topics Concern   Not on file  Social History Narrative   Not on file   Social Determinants of Health   Financial Resource Strain: Low Risk  (09/18/2018)   Overall Financial Resource Strain (CARDIA)    Difficulty of Paying Living Expenses: Not hard at all  Food Insecurity: Unknown (09/18/2018)   Hunger Vital Sign    Worried About Running Out of Food in the Last Year: Never true    Ran Out of Food in the Last Year: Not on file  Transportation Needs: No Transportation Needs (09/18/2018)   PRAPARE - Administrator, Civil Service (Medical): No    Lack of Transportation (Non-Medical): No  Physical Activity: Not on file  Stress: Not on file  Social Connections: Not on file      Family History  Problem Relation Age of Onset   Cancer Maternal Grandmother    Diabetes Maternal Grandfather    Alcohol abuse Father    Drug abuse Father    Anemia Mother       ROS:   Please see the history of present illness.     All other systems reviewed and are negative.   Labs/EKG Reviewed:    EKG:   EKG is was ordered today.  The ekg ordered today demonstrates sinus rhythm.  Recent Labs: 03/20/2023: ALT 13; BUN  6; Creatinine, Ser 0.42; Hemoglobin 10.7; Platelets 220; Potassium 3.9; Sodium 135   Recent Lipid Panel No results found for: "CHOL", "TRIG", "HDL", "CHOLHDL", "LDLCALC", "LDLDIRECT"  Physical Exam:    VS:  BP 126/72   Pulse 84   Ht 5' (1.524 m)   Wt 222 lb 6.4 oz (100.9 kg)   LMP 09/05/2022   SpO2 97%   BMI 43.43 kg/m     Wt Readings from Last 3 Encounters:  05/13/23 222 lb 6.4 oz (100.9 kg)  05/08/23 224 lb 8 oz (101.8 kg)  04/24/23 214 lb 14.4 oz (97.5 kg)     GEN:  Well nourished, well developed in no acute distress HEENT: Normal NECK: No JVD; No carotid bruits LYMPHATICS: No lymphadenopathy CARDIAC: RRR, no murmurs, rubs, gallops RESPIRATORY:  Clear to auscultation without rales, wheezing or rhonchi  ABDOMEN: Soft, non-tender, non-distended MUSCULOSKELETAL: Trace lower extremity bilateral edema; No deformity  SKIN: Warm and dry NEUROLOGIC:  Alert and oriented x 3 PSYCHIATRIC:  Normal affect    Risk Assessment/Risk Calculators:           { Click for CHADS2VASc Score - THEN Refresh Note    :[id number]}      ASSESSMENT & PLAN:    Chronic hypertension in pregnancy Dyspnea on exertion Leg swelling Obesity in pregnancy. Renal Artery Stenosis  Blood pressure in office acceptable.  She is experiencing significant leg swelling.  Will give the patient 3 days of Lasix 40 mg daily to with potassium supplement. Will get blood work today for [employer] and mag.  The patient is in agreement with the above plan. The patient left the office in stable condition.  The patient will follow up in 2 weeks.   Patient Instructions  Medication Instructions:  Your physician has recommended you make the following change in your medication:  For 3 days: Lasix 40 mg once daily For 3 days: Potassium 20 Eq once daily *If you need a refill on your cardiac medications before your next appointment, please call your pharmacy*   Lab Work: Your physician recommends that you have labs drawn  today: BMET, Mag If you have labs (blood work) drawn today and your tests are completely normal, you will receive your results only by: MyChart Message (if you have MyChart) OR A paper copy in the mail If you have any lab test that is abnormal or we need to change your treatment, we will call you to review the results.   Testing/Procedures: None   Follow-Up: At [facility], you and your health needs are our priority.  As part of our continuing mission to provide you with exceptional heart care, we have created designated Provider Care Teams.  These Care Teams include your primary Cardiologist (physician) and Advanced Practice Providers (APPs -  Physician Assistants and Nurse Practitioners) who all work together to provide you with the care you need, when you need it.   Your next appointment:   1 week doublebook   Provider:   Thomasene Ripple, DO    Dispo:  No follow-ups on file.   Medication Adjustments/Labs and Tests Ordered: Current medicines are reviewed at length with the patient today.  Concerns regarding medicines are outlined above.  Tests Ordered: No orders of the defined types were placed in this encounter.  Medication Changes: No orders of the defined types were placed in this encounter.

## 2023-05-13 NOTE — Patient Instructions (Addendum)
Medication Instructions:  Your physician has recommended you make the following change in your medication:  For 3 days: Lasix 40 mg once daily For 3 days: Potassium 20 mEq once daily *If you need a refill on your cardiac medications before your next appointment, please call your pharmacy*   Lab Work: Your physician recommends that you have labs drawn today: BMET, Mag, CBC If you have labs (blood work) drawn today and your tests are completely normal, you will receive your results only by: MyChart Message (if you have MyChart) OR A paper copy in the mail If you have any lab test that is abnormal or we need to change your treatment, we will call you to review the results.   Testing/Procedures: None   Follow-Up: At [facility], you and your health needs are our priority.  As part of our continuing mission to provide you with exceptional heart care, we have created designated Provider Care Teams.  These Care Teams include your primary Cardiologist (physician) and Advanced Practice Providers (APPs -  Physician Assistants and Nurse Practitioners) who all work together to provide you with the care you need, when you need it.   Your next appointment:   1 week doublebook okay  Provider:   Thomasene Ripple, DO

## 2023-05-14 LAB — BASIC METABOLIC PANEL
BUN/Creatinine Ratio: 12 (ref 9–23)
BUN: 6 mg/dL (ref 6–20)
CO2: 21 mmol/L (ref 20–29)
Calcium: 9 mg/dL (ref 8.7–10.2)
Chloride: 102 mmol/L (ref 96–106)
Creatinine, Ser: 0.51 mg/dL — ABNORMAL LOW (ref 0.57–1.00)
Glucose: 107 mg/dL — ABNORMAL HIGH (ref 70–99)
Potassium: 4 mmol/L (ref 3.5–5.2)
Sodium: 135 mmol/L (ref 134–144)
eGFR: 126 mL/min/{1.73_m2} (ref >59)

## 2023-05-14 LAB — CBC WITH DIFFERENTIAL/PLATELET
Basophils Absolute: 0 10*3/uL (ref 0.0–0.2)
Basos: 0 %
EOS (ABSOLUTE): 0.2 10*3/uL (ref 0.0–0.4)
Eos: 2 %
Hematocrit: 31.1 % — ABNORMAL LOW (ref 34.0–46.6)
Hemoglobin: 10.5 g/dL — ABNORMAL LOW (ref 11.1–15.9)
Immature Grans (Abs): 0 10*3/uL (ref 0.0–0.1)
Immature Granulocytes: 0 %
Lymphocytes Absolute: 1.3 10*3/uL (ref 0.7–3.1)
Lymphs: 14 %
MCH: 27.6 pg (ref 26.6–33.0)
MCHC: 33.8 g/dL (ref 31.5–35.7)
MCV: 82 fL (ref 79–97)
Monocytes Absolute: 0.8 10*3/uL (ref 0.1–0.9)
Monocytes: 8 %
Neutrophils Absolute: 7.1 10*3/uL — ABNORMAL HIGH (ref 1.4–7.0)
Neutrophils: 76 %
Platelets: 187 10*3/uL (ref 150–450)
RBC: 3.81 x10E6/uL (ref 3.77–5.28)
RDW: 14.3 % (ref 11.7–15.4)
WBC: 9.4 10*3/uL (ref 3.4–10.8)

## 2023-05-14 LAB — SPECIMEN STATUS REPORT

## 2023-05-14 LAB — MAGNESIUM: Magnesium: 1.7 mg/dL (ref 1.6–2.3)

## 2023-05-15 ENCOUNTER — Ambulatory Visit (INDEPENDENT_AMBULATORY_CARE_PROVIDER_SITE_OTHER): Payer: Medicaid Other | Admitting: Obstetrics and Gynecology

## 2023-05-15 ENCOUNTER — Other Ambulatory Visit (HOSPITAL_COMMUNITY): Admission: RE | Admit: 2023-05-15 | Payer: Medicaid Other | Source: Ambulatory Visit

## 2023-05-15 VITALS — BP 122/80 | HR 76 | Wt 221.0 lb

## 2023-05-15 DIAGNOSIS — O34219 Maternal care for unspecified type scar from previous cesarean delivery: Secondary | ICD-10-CM

## 2023-05-15 DIAGNOSIS — O0990 Supervision of high risk pregnancy, unspecified, unspecified trimester: Secondary | ICD-10-CM

## 2023-05-15 DIAGNOSIS — O09293 Supervision of pregnancy with other poor reproductive or obstetric history, third trimester: Secondary | ICD-10-CM

## 2023-05-15 DIAGNOSIS — A6000 Herpesviral infection of urogenital system, unspecified: Secondary | ICD-10-CM

## 2023-05-15 DIAGNOSIS — J45909 Unspecified asthma, uncomplicated: Secondary | ICD-10-CM

## 2023-05-15 DIAGNOSIS — O0993 Supervision of high risk pregnancy, unspecified, third trimester: Secondary | ICD-10-CM

## 2023-05-15 DIAGNOSIS — O10913 Unspecified pre-existing hypertension complicating pregnancy, third trimester: Secondary | ICD-10-CM

## 2023-05-15 DIAGNOSIS — O09299 Supervision of pregnancy with other poor reproductive or obstetric history, unspecified trimester: Secondary | ICD-10-CM

## 2023-05-15 DIAGNOSIS — Z3A36 36 weeks gestation of pregnancy: Secondary | ICD-10-CM

## 2023-05-16 LAB — CERVICOVAGINAL ANCILLARY ONLY
Chlamydia: NEGATIVE
Comment: NEGATIVE
Comment: NORMAL
Neisseria Gonorrhea: NEGATIVE

## 2023-05-16 NOTE — Progress Notes (Signed)
   PRENATAL VISIT NOTE  Subjective:  Dawn Hoffman is a 32 y.o. G3P0110 at [redacted]w[redacted]d being seen today for ongoing prenatal care.  She is currently monitored for the following issues for this high-risk pregnancy and has ASTHMA, UNSPECIFIED; Supervision of high risk pregnancy, antepartum; Chronic hypertension in obstetric context in third trimester; Previous cesarean delivery affecting pregnancy, antepartum; Obesity affecting pregnancy, antepartum; Genital herpes simplex; and Pregnancy with poor obstetric history (neonatal demise at 92 weeks due to ICH) on their problem list.  Patient reports  doing well overall .  Contractions: Not present. Vag. Bleeding: None.  Movement: Present. Denies leaking of fluid.   The following portions of the patient's history were reviewed and updated as appropriate: allergies, current medications, past family history, past medical history, past social history, past surgical history and problem list.   Objective:   Vitals:   05/15/23 1415  BP: 122/80  Pulse: 76  Weight: 221 lb (100.2 kg)    Fetal Status: Fetal Heart Rate (bpm): 145   Movement: Present  Presentation: Vertex  General:  Alert, oriented and cooperative. Patient is in no acute distress.  Skin: Skin is warm and dry. No rash noted.   Cardiovascular: Normal heart rate noted  Respiratory: Normal respiratory effort, no problems with respiration noted  Abdomen: Soft, gravid, appropriate for gestational age.  Pain/Pressure: Absent      Assessment and Plan:  Pregnancy: G3P0110 at [redacted]w[redacted]d 1. Supervision of high risk pregnancy, antepartum 2. [redacted] weeks gestation of pregnancy Vertex by [place] today - Cervicovaginal ancillary only( [place]) - Culture, beta strep (group b only)  3. 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 Servando Salina 7/8 - taking lasix 40mg  daily x 3d to help with leg swelling Growth @  32/6 2060g (40%), AC 47%, anterior, AFI 14.58 No further growths planned. Per pt, MFM is no longer following and she can get weekly NSTs through our office. Ordered IOL ordered for 38-39 weeks  4. Genital herpes simplex, unspecified site Taking valtrex suppression  5. Previous cesarean delivery affecting pregnancy, antepartum G1 LTCS for FGR w/ abnormal dopplers Plans TOLAC, consent previously signed  6. Uncomplicated asthma, unspecified asthma severity, unspecified whether persistent No complaints  7. Pregnancy with poor obstetric history (neonatal demise at 31 weeks due to ICH) Antenatal testing & IOL as outlined above  Please refer to After Visit Summary for other counseling recommendations.   Return in about 1 week (around 05/22/2023) for return OB at 37 weeks with NST.  Future Appointments  Date Time Provider Department Center  05/22/2023  9:35 AM Lanae Crumbly, Lahoma Crocker, DO [facility] None  05/24/2023  9:40 AM Tobb, Lavona Mound, DO [facility] None  05/29/2023  8:35 AM Lennart Pall, MD [facility] None  06/05/2023  7:15 AM [facility] SCHED ROOM [facility] None  06/05/2023  8:35 AM Sue Lush, FNP [facility] None  06/12/2023  8:35 AM Adam Phenix, MD [facility] None   Lennart Pall, MD

## 2023-05-19 LAB — CULTURE, BETA STREP (GROUP B ONLY): Strep Gp B Culture: NEGATIVE

## 2023-05-22 ENCOUNTER — Inpatient Hospital Stay (HOSPITAL_COMMUNITY)
Admission: RE | Admit: 2023-05-22 | Discharge: 2023-05-27 | DRG: 787 | Disposition: A | Discharge status: Home / Self Care | Payer: Medicaid Other | Attending: Obstetrics and Gynecology | Admitting: Obstetrics and Gynecology

## 2023-05-22 ENCOUNTER — Ambulatory Visit: Payer: Medicaid Other | Admitting: Family Medicine

## 2023-05-22 ENCOUNTER — Other Ambulatory Visit: Payer: Self-pay

## 2023-05-22 ENCOUNTER — Encounter (HOSPITAL_COMMUNITY): Payer: Self-pay | Admitting: Obstetrics and Gynecology

## 2023-05-22 VITALS — BP 140/91 | HR 76 | Wt 222.0 lb

## 2023-05-22 DIAGNOSIS — A6000 Herpesviral infection of urogenital system, unspecified: Secondary | ICD-10-CM | POA: Diagnosis present

## 2023-05-22 DIAGNOSIS — O1092 Unspecified pre-existing hypertension complicating childbirth: Secondary | ICD-10-CM | POA: Diagnosis present

## 2023-05-22 DIAGNOSIS — O0990 Supervision of high risk pregnancy, unspecified, unspecified trimester: Secondary | ICD-10-CM

## 2023-05-22 DIAGNOSIS — O9832 Other infections with a predominantly sexual mode of transmission complicating childbirth: Secondary | ICD-10-CM | POA: Diagnosis present

## 2023-05-22 DIAGNOSIS — O99210 Obesity complicating pregnancy, unspecified trimester: Secondary | ICD-10-CM

## 2023-05-22 DIAGNOSIS — Z3A37 37 weeks gestation of pregnancy: Secondary | ICD-10-CM

## 2023-05-22 DIAGNOSIS — Z3490 Encounter for supervision of normal pregnancy, unspecified, unspecified trimester: Secondary | ICD-10-CM | POA: Diagnosis present

## 2023-05-22 DIAGNOSIS — O99214 Obesity complicating childbirth: Secondary | ICD-10-CM | POA: Diagnosis present

## 2023-05-22 DIAGNOSIS — O09293 Supervision of pregnancy with other poor reproductive or obstetric history, third trimester: Secondary | ICD-10-CM

## 2023-05-22 DIAGNOSIS — O34211 Maternal care for low transverse scar from previous cesarean delivery: Secondary | ICD-10-CM | POA: Diagnosis not present

## 2023-05-22 DIAGNOSIS — O114 Pre-existing hypertension with pre-eclampsia, complicating childbirth: Secondary | ICD-10-CM | POA: Diagnosis present

## 2023-05-22 DIAGNOSIS — O10913 Unspecified pre-existing hypertension complicating pregnancy, third trimester: Secondary | ICD-10-CM

## 2023-05-22 DIAGNOSIS — O09299 Supervision of pregnancy with other poor reproductive or obstetric history, unspecified trimester: Secondary | ICD-10-CM

## 2023-05-22 DIAGNOSIS — O1413 Severe pre-eclampsia, third trimester: Secondary | ICD-10-CM | POA: Diagnosis not present

## 2023-05-22 DIAGNOSIS — O0993 Supervision of high risk pregnancy, unspecified, third trimester: Secondary | ICD-10-CM

## 2023-05-22 DIAGNOSIS — O34219 Maternal care for unspecified type scar from previous cesarean delivery: Secondary | ICD-10-CM

## 2023-05-22 DIAGNOSIS — O1414 Severe pre-eclampsia complicating childbirth: Secondary | ICD-10-CM | POA: Diagnosis not present

## 2023-05-22 DIAGNOSIS — Z98891 History of uterine scar from previous surgery: Secondary | ICD-10-CM

## 2023-05-22 LAB — CBC
HCT: 31.9 % — ABNORMAL LOW (ref 36.0–46.0)
Hemoglobin: 10.6 g/dL — ABNORMAL LOW (ref 12.0–15.0)
MCH: 27.5 pg (ref 26.0–34.0)
MCHC: 33.2 g/dL (ref 30.0–36.0)
MCV: 82.9 fL (ref 80.0–100.0)
Platelets: 197 10*3/uL (ref 150–400)
RBC: 3.85 MIL/uL — ABNORMAL LOW (ref 3.87–5.11)
RDW: 13.8 % (ref 11.5–15.5)
WBC: 11.6 10*3/uL — ABNORMAL HIGH (ref 4.0–10.5)
nRBC: 0 % (ref 0.0–0.2)

## 2023-05-22 LAB — COMPREHENSIVE METABOLIC PANEL
ALT: 20 U/L (ref 0–44)
AST: 21 U/L (ref 15–41)
Albumin: 2.7 g/dL — ABNORMAL LOW (ref 3.5–5.0)
Alkaline Phosphatase: 63 U/L (ref 38–126)
Anion gap: 10 (ref 5–15)
BUN: 7 mg/dL (ref 6–20)
CO2: 21 mmol/L — ABNORMAL LOW (ref 22–32)
Calcium: 8.7 mg/dL — ABNORMAL LOW (ref 8.9–10.3)
Chloride: 104 mmol/L (ref 98–111)
Creatinine, Ser: 0.51 mg/dL (ref 0.44–1.00)
GFR, Estimated: >60 mL/min (ref >60)
Glucose, Bld: 96 mg/dL (ref 70–99)
Potassium: 3.4 mmol/L — ABNORMAL LOW (ref 3.5–5.1)
Sodium: 135 mmol/L (ref 135–145)
Total Bilirubin: 0.4 mg/dL (ref 0.3–1.2)
Total Protein: 6 g/dL — ABNORMAL LOW (ref 6.5–8.1)

## 2023-05-22 MED ORDER — LIDOCAINE HCL (PF) 1 % IJ SOLN: 30.0000 mL | INTRAMUSCULAR | Status: DC | PRN

## 2023-05-22 MED ORDER — LACTATED RINGERS IV SOLN
INTRAVENOUS | Status: DC
  Administered 2023-05-24: 125 mL/h via INTRAVENOUS

## 2023-05-22 MED ORDER — OXYCODONE-ACETAMINOPHEN 5-325 MG PO TABS: 1.0000 | ORAL_TABLET | ORAL | Status: DC | PRN

## 2023-05-22 MED ORDER — MISOPROSTOL 50MCG HALF TABLET
50.0000 ug | ORAL_TABLET | Freq: Once | ORAL | Status: AC
  Administered 2023-05-22: 50 ug via ORAL
  Filled 2023-05-22: qty 1

## 2023-05-22 MED ORDER — OXYTOCIN BOLUS FROM INFUSION: 333.0000 mL | Freq: Once | INTRAVENOUS | Status: DC

## 2023-05-22 MED ORDER — SOD CITRATE-CITRIC ACID 500-334 MG/5ML PO SOLN
30.0000 mL | ORAL | Status: DC | PRN
  Filled 2023-05-22: qty 30

## 2023-05-22 MED ORDER — ACETAMINOPHEN 325 MG PO TABS: 650.0000 mg | ORAL_TABLET | ORAL | Status: DC | PRN

## 2023-05-22 MED ORDER — ONDANSETRON HCL 4 MG/2ML IJ SOLN
4.0000 mg | Freq: Four times a day (QID) | INTRAMUSCULAR | Status: DC | PRN
  Administered 2023-05-23 – 2023-05-24 (×3): 4 mg via INTRAVENOUS
  Filled 2023-05-22 (×4): qty 2

## 2023-05-22 MED ORDER — OXYTOCIN-SODIUM CHLORIDE 30-0.9 UT/500ML-% IV SOLN: 2.5000 [IU]/h | INTRAVENOUS | Status: DC

## 2023-05-22 MED ORDER — OXYTOCIN-SODIUM CHLORIDE 30-0.9 UT/500ML-% IV SOLN
1.0000 m[IU]/min | INTRAVENOUS | Status: DC
  Administered 2023-05-22: 2 m[IU]/min via INTRAVENOUS
  Filled 2023-05-22: qty 500

## 2023-05-22 MED ORDER — OXYCODONE-ACETAMINOPHEN 5-325 MG PO TABS: 2.0000 | ORAL_TABLET | ORAL | Status: DC | PRN

## 2023-05-22 MED ORDER — LACTATED RINGERS IV SOLN: 500.0000 mL | INTRAVENOUS | Status: DC | PRN

## 2023-05-22 MED ORDER — MISOPROSTOL 25 MCG QUARTER TABLET
25.0000 ug | ORAL_TABLET | Freq: Once | ORAL | Status: AC
  Administered 2023-05-22: 25 ug via VAGINAL
  Filled 2023-05-22: qty 1

## 2023-05-22 MED ORDER — TERBUTALINE SULFATE 1 MG/ML IJ SOLN: 0.2500 mg | Freq: Once | INTRAMUSCULAR | Status: DC | PRN

## 2023-05-22 MED ORDER — LABETALOL HCL 200 MG PO TABS
200.0000 mg | ORAL_TABLET | Freq: Two times a day (BID) | ORAL | Status: DC
  Administered 2023-05-22 – 2023-05-23 (×3): 200 mg via ORAL
  Filled 2023-05-22 (×3): qty 1

## 2023-05-22 NOTE — Progress Notes (Signed)
Labor Progress Note Dawn Hoffman is a 32 y.o. G3P0110 at [redacted]w[redacted]d presented for IOL 2/2 cHTN, TOLAC.   S: No acute concerns.   O:  BP (!) 145/76   Pulse 77   Temp 97.7 F (36.5 C) (Oral)   Resp 16   Ht 5' (1.524 m)   Wt 100.9 kg   LMP 09/05/2022   BMI 43.43 kg/m  EFM: 140bpm/moderate/+accels, no decels  CVE: Dilation: Fingertip Effacement (%): Thick Station: -3, Ballotable Presentation: Vertex (by [place]) Exam by:: Darrin Luis RN   A&P: 33 y.o. G3P0110 [redacted]w[redacted]d here for IOL 2/2 cHTN, TOLAC.  #Labor: Unchanged. Start pitocin and reassess for FB at next exam.  #Pain: Maternally supported, planning for epidural #FWB: Cat I  #GBS negative  cHTN BP 145/76 -Start home labetalol 200 BID  Liese Dizdarevic Autry-Lott, DO 9:04 PM

## 2023-05-22 NOTE — Assessment & Plan Note (Addendum)
BP elevated 142/85. >140/91  Compliant with labetalol 200 mg twice daily Recently finished Lasix/potassium course per cardiology-OB NST reactive and reassuring Since pt does not have BP contrlled and is 37 wk, will send for IOL for cHTN. Message sent to L&D team who will accept admission.

## 2023-05-22 NOTE — Assessment & Plan Note (Deleted)
Induction of labor scheduled for 7/31

## 2023-05-22 NOTE — Assessment & Plan Note (Signed)
Compliant with meds 

## 2023-05-22 NOTE — Assessment & Plan Note (Addendum)
Plan for TOLAC, consent signed 05/08/2023

## 2023-05-22 NOTE — Progress Notes (Signed)
Pt presents for NST. Requesting cervical check.

## 2023-05-22 NOTE — H&P (Addendum)
OBSTETRIC ADMISSION HISTORY AND PHYSICAL  Dawn Hoffman is a 32 y.o. female G30P0110 with IUP at [redacted]w[redacted]d by LMP presenting for IOL 2/2 cHTN on labetalol 200mg  BID. She reports +FMs, No LOF, no VB, no blurry vision, headaches or peripheral edema, and RUQ pain. She plans on breast feeding. She is undecided about postpartum birth control.  She received her prenatal care at [facility].    Dating: By LMP --->  Estimated Date of Delivery: 06/12/23  Sono:   @[redacted]w[redacted]d , CWD, normal anatomy, frank breech presentation, anterior placenta lie, 2060g, 40% EFW  Prenatal History/Complications: cHTN, asthma, anemia, Hx C/S x1, Obesity, HSV, Hx of neonatal demise at 29 weeks 2/2 ICH  She has had a previous C-section for Non reassuring fetal status, BPP 0/8 and severe IUGR.   Past Medical History: Past Medical History:  Diagnosis Date   Abnormal fetal ultrasound 11/10/2018   Anemia    Anxiety    Asthma    Dyspnea    Headache    Hypertension    HYPERTENSION, BENIGN SYSTEMIC 01/02/2007   Qualifier: Diagnosis of   By: Doneen Poisson, UNSPEC., W/O INTRACTABLE MIGRAINE 01/02/2007   Qualifier: Diagnosis of   By: Levada Schilling      Replacing diagnoses that were inactivated after the 02/04/23 regulatory import   RHINITIS, ALLERGIC 01/02/2007   Qualifier: Diagnosis of   By: Levada Schilling      Replacing diagnoses that were inactivated after the 02/04/23 regulatory import    Past Surgical History: Past Surgical History:  Procedure Laterality Date   CESAREAN SECTION N/A 12/08/2018   Procedure: CESAREAN SECTION;  Surgeon: Hermina Staggers, MD;  Location: [facility] BIRTHING SUITES;  Service: Obstetrics;  Laterality: N/A;    Obstetrical History: OB History     Gravida  3   Para  1   Term      Preterm  1   AB  1   Living  0      SAB  1   IAB      Ectopic      Multiple  0   Live Births  1           Social History Social History   Socioeconomic History   Marital status: Single     Spouse name: Not on file   Number of children: Not on file   Years of education: Not on file   Highest education level: Not on file  Occupational History   Occupation: Magazine features editor: [employer]  Tobacco Use   Smoking status: Never   Smokeless tobacco: Never  Vaping Use   Vaping status: Never Used  Substance and Sexual Activity   Alcohol use: No   Drug use: No   Sexual activity: Yes    Birth control/protection: None  Other Topics Concern   Not on file  Social History Narrative   Not on file   Social Determinants of Health   Financial Resource Strain: Low Risk  (09/18/2018)   Overall Financial Resource Strain (CARDIA)    Difficulty of Paying Living Expenses: Not hard at all  Food Insecurity: No Food Insecurity (05/22/2023)   Hunger Vital Sign    Worried About Running Out of Food in the Last Year: Never true    Ran Out of Food in the Last Year: Never true  Transportation Needs: No Transportation Needs (05/22/2023)   PRAPARE - Administrator, Civil Service (Medical): No  Lack of Transportation (Non-Medical): No  Physical Activity: Not on file  Stress: Not on file  Social Connections: Not on file    Family History: Family History  Problem Relation Age of Onset   Cancer Maternal Grandmother    Diabetes Maternal Grandfather    Alcohol abuse Father    Drug abuse Father    Anemia Mother     Allergies: No Known Allergies  Medications Prior to Admission  Medication Sig Dispense Refill Last Dose   aspirin EC 81 MG tablet Take 1 tablet (81 mg total) by mouth daily. Take after 12 weeks for prevention of preeclampsia later in pregnancy 300 tablet 2 05/22/2023   ferrous sulfate 324 (65 Fe) MG TBEC Take 1 tablet (325 mg total) by mouth every other day. 30 tablet 5 Past Week   labetalol (NORMODYNE) 200 MG tablet Take 1 tablet (200 mg total) by mouth 2 (two) times daily. 60 tablet 5 05/22/2023   Prenatal Vit-Fe Fumarate-FA (PRENATAL VITAMIN PO)  Take 1 tablet by mouth daily.   05/22/2023   valACYclovir (VALTREX) 500 MG tablet Take 1 tablet (500 mg total) by mouth 2 (two) times daily. 60 tablet 6 05/22/2023   Blood Pressure Monitoring (BLOOD PRESSURE KIT) DEVI 1 Device by Does not apply route once a week. 1 each 0    fexofenadine (ALLEGRA) 180 MG tablet Take 180 mg by mouth daily. (Patient not taking: Reported on 04/22/2023)      furosemide (LASIX) 40 MG tablet Take 1 tablet (40 mg total) by mouth daily. 3 tablet 0    loratadine (CLARITIN) 10 MG tablet Take 1 tablet (10 mg total) by mouth daily. 30 tablet 11    montelukast (SINGULAIR) 10 MG tablet Take 10 mg by mouth as needed. (Patient not taking: Reported on 04/22/2023)      potassium chloride SA (KLOR-CON M20) 20 MEQ tablet Take 1 tablet (20 mEq total) by mouth daily. 3 tablet 0      Review of Systems   All systems reviewed and negative except as stated in HPI  Blood pressure (!) 154/93, pulse 77, temperature 97.7 F (36.5 C), temperature source Oral, resp. rate 16, height 5' (1.524 m), weight 100.9 kg, last menstrual period 09/05/2022. General appearance: alert, cooperative, and no distress Lungs: clear to auscultation bilaterally Heart: regular rate and rhythm Abdomen: soft, non-tender; bowel sounds normal SSE: No external genital lesions noted, no vaginal or cervical lesions noted on exam. Scant white discharge noted.  Extremities: Homans sign is negative, no sign of DVT DTR's: +2  Presentation: cephalic, confirmed with bedside US  Fetal monitoring: Baseline: 145 bpm,  Variability: Moderate, Accelerations: +accels, and Decelerations: Absent Uterine activity: Contracting every 3-6 mintues  Dilation: Fingertip Effacement (%): Thick Station: -3, Ballotable Exam by:: Darrin Luis RN   Prenatal labs: ABO, Rh: --/--/B POS (07/17 1645) Antibody: NEG (07/17 1645) Rubella: 1.73 (01/04 1036) RPR: Non Reactive (05/15 0840)  HBsAg: Negative (01/04 1036)  HIV: Non  Reactive (05/15 0840)  GBS: Negative/-- (07/10 1506)  2 hr Glucola wnl, early hgb A1c 5.7 Genetic screening  NIPS low risk, AFP negative Anatomy US: normal, obtained serial growth [place] due to cHTN  Prenatal Transfer Tool  Maternal Diabetes: No Genetic Screening: Normal Maternal Ultrasounds/Referrals: Normal Fetal Ultrasounds or other Referrals:  None Maternal Substance Abuse:  No Significant Maternal Medications:  Meds include: Other: labetalol, ASA, ferrous sulfate, valacyclovir  - recently finished lasix/potassium course per Cardiology Significant Maternal Lab Results:  Group B Strep negative, HSV positive  Number of Prenatal Visits:greater than 3 verified prenatal visits Other Comments:  None  Results for orders placed or performed during the hospital encounter of 05/22/23 (from the past 24 hour(s))  Type and screen   Collection Time: 05/22/23  4:45 PM  Result Value Ref Range   ABO/RH(D) B POS    Antibody Screen NEG    Sample Expiration      05/25/2023,2359 Performed at [facility], [address], [place], [place] [address]   CBC   Collection Time: 05/22/23  4:50 PM  Result Value Ref Range   WBC 11.6 (H) 4.0 - 10.5 K/uL   RBC 3.85 (L) 3.87 - 5.11 MIL/uL   Hemoglobin 10.6 (L) 12.0 - 15.0 g/dL   HCT 60.4 (L) 54.0 - 98.1 %   MCV 82.9 80.0 - 100.0 fL   MCH 27.5 26.0 - 34.0 pg   MCHC 33.2 30.0 - 36.0 g/dL   RDW 19.1 47.8 - 29.5 %   Platelets 197 150 - 400 K/uL   nRBC 0.0 0.0 - 0.2 %  Comprehensive metabolic panel   Collection Time: 05/22/23  4:50 PM  Result Value Ref Range   Sodium 135 135 - 145 mmol/L   Potassium 3.4 (L) 3.5 - 5.1 mmol/L   Chloride 104 98 - 111 mmol/L   CO2 21 (L) 22 - 32 mmol/L   Glucose, Bld 96 70 - 99 mg/dL   BUN 7 6 - 20 mg/dL   Creatinine, Ser 6.21 0.44 - 1.00 mg/dL   Calcium 8.7 (L) 8.9 - 10.3 mg/dL   Total Protein 6.0 (L) 6.5 - 8.1 g/dL   Albumin 2.7 (L) 3.5 - 5.0 g/dL   AST 21 15 - 41 U/L   ALT 20 0 - 44 U/L   Alkaline Phosphatase  63 38 - 126 U/L   Total Bilirubin 0.4 0.3 - 1.2 mg/dL   GFR, Estimated >30 >86 mL/min   Anion gap 10 5 - 15    Patient Active Problem List   Diagnosis Date Noted   Encounter for induction of labor 05/22/2023   Genital herpes simplex 03/25/2023   Pregnancy with poor obstetric history (neonatal demise at 29 weeks due to ICH) 03/25/2023   Chronic hypertension in obstetric context in third trimester 11/21/2022   Previous cesarean delivery affecting pregnancy, antepartum 11/21/2022   Obesity affecting pregnancy, antepartum 11/21/2022   Supervision of high risk pregnancy, antepartum 11/08/2022   ASTHMA, UNSPECIFIED 01/02/2007    Assessment/Plan:  KINDEL ROCHEFORT is a 32 y.o. G3P0110 at [redacted]w[redacted]d here for IOL 2/2 cHTN with desired TOLAC.   #Labor: Patient was admitted and was inadvertently given cytotec. Patient is doing well and is not currently feeling her contractions. Fetal heart rate and contractions reassuring. Will not give any additional cytotec. Patient was agreeable to proceed with Foley bulb placement if appropriate at her next cervical exam.  #Pain: Open to epidural #FWB: Category I tracing #ID:  GBS negative #MOF: breast  #MOC: Would like to discuss at 6 week postpartum visit  #cHTN: Monitor BP, CBC, CMP ordered.  #HSV: No active lesions, has been on valtrex ppx    Glee Arvin, MD  05/22/2023, 6:57 PM  Fellow Attestation  I saw and evaluated the patient, performing the key elements of the service.I  personally performed or re-performed the history, physical exam, and medical decision making activities of this service and have verified that the service and findings are accurately documented in the resident's note. I developed the  management plan that is described in the resident's note, and I agree with the content, with my edits above.   Patient seen. Had received cytotec 50mg  PO and 25mg  vaginal inadvertently on admission; speculum exam done and attempt made to  sweep leftover vaginal cytotec out. No HSV lesions noted. Baby has been doing well, with no decels noted. Mild cramping from contractions. No vaginal bleeding or concerns for uterine rupture. Cytotec dc'd. Plan to continue with a FB 4hrs after cytotec, and can be on low dose pitocin following that.  CBC, CMP stable. Blood pressures elevated, but not in severe range.  Will continue home blood pressure meds and monitor blood pressures.  Dawn Ferguson, MD,[facility] OB Fellow, Faculty Practice  05/22/2023 8:38 PM

## 2023-05-22 NOTE — Progress Notes (Signed)
PRENATAL VISIT NOTE  Subjective:  Dawn Hoffman is a 32 y.o. G3P0110 at [redacted]w[redacted]d being seen today for ongoing prenatal care.  She is currently monitored for the following issues for this high-risk pregnancy and has ASTHMA, UNSPECIFIED; Supervision of high risk pregnancy, antepartum; Chronic hypertension in obstetric context in third trimester; Previous cesarean delivery affecting pregnancy, antepartum; Obesity affecting pregnancy, antepartum; Genital herpes simplex; and Pregnancy with poor obstetric history (neonatal demise at 13 weeks due to ICH) on their problem list.  Patient reports no complaints.  Contractions: Not present. Vag. Bleeding: None.  Movement: Present. Denies leaking of fluid.   The following portions of the patient's history were reviewed and updated as appropriate: allergies, current medications, past family history, past medical history, past social history, past surgical history and problem list.   Objective:   Vitals:   05/22/23 0959 05/22/23 1046  BP: (!) 142/85 (!) 140/91  Pulse: 80 76  Weight: 222 lb (100.7 kg)     Fetal Status:     Movement: Present  Presentation: Undeterminable  General:  Alert, oriented and cooperative. Patient is in no acute distress.  Skin: Skin is warm and dry. No rash noted.   Cardiovascular: Normal heart rate noted  Respiratory: Normal respiratory effort, no problems with respiration noted  Abdomen: Soft, gravid, appropriate for gestational age.  Pain/Pressure: Absent     Pelvic: Cervical exam performed in the presence of a chaperone Dilation: Fingertip Effacement (%): Thick Station: -3  Extremities: Normal range of motion.  Edema: Trace  Mental Status: Normal mood and affect. Normal behavior. Normal judgment and thought content.   Assessment and Plan:  Pregnancy: G3P0110 at [redacted]w[redacted]d Dawn Hoffman was seen today for non-stress test.  Chronic hypertension in obstetric context in third trimester Overview: [x]  Aspirin 81 mg daily after  12 weeks Current antihypertensives:  Labetalol 200 BID   Baseline and surveillance labs (pulled in from [facility], refresh links as needed)  Lab Results  Component Value Date   PLT 242 11/08/2022   CREATININE 0.57 11/08/2022   AST 13 11/08/2022   ALT 11 11/08/2022   PROTCRRATIO 0.14 10/30/2018    Antenatal Testing CHTN   Group I   BP < 140/90, no meds, no preeclampsia, AGA, nml AFV   Group II  BP > 140/90, on meds, no preeclampsia, AGA, nml AFV  24-28-32-36  24-28-32-36  None  32  38-39.6  37-39  (36-37 or earlier for poor control)  Pre-eclampsia  GHTN or Preeclampsia without severe features   Preeclampsia with severe features   Q 4 wks  Q 2 wks  28  Inpatient  37-37.6  PRN or 34     Assessment & Plan: BP elevated 142/85. >140/91  Compliant with labetalol 200 mg twice daily Recently finished Lasix/potassium course per cardiology-OB NST reactive and reassuring Since pt does not have BP contrlled and is 37 wk, will send for IOL for cHTN. Message sent to L&D team who will accept admission.    Genital herpes simplex, unspecified site Overview: Valtrex 500 mg twice daily  Assessment & Plan: Compliant with meds   Pregnancy with poor obstetric history (neonatal demise at 29 weeks due to ICH)  Previous cesarean delivery affecting pregnancy, antepartum Overview: LTCS at 29 wks FGR with abnormal dopplers with neonatal demise at day 5  Assessment & Plan: Plan for [facility], consent signed 05/08/2023   Supervision of high risk pregnancy, antepartum Overview:  Nursing Staff Provider  Office Location [facility] Dating  06/12/2023, by Last Menstrual  Period  [facility] Model Arly.Keller ] Traditional [ ]  Centering [ ]  Mom-Baby Dyad    Language  English Anatomy US   01/16/23, normal, serial growth scans and antenatal testing due to [facility]  Flu Vaccine   Declined 11/21/2022 Genetic/Carrier Screen  NIPS:   low risks female AFP:   neg [place]:  TDaP Vaccine   Given 03-20-23 Hgb A1C or  GTT  Early 5.7  nl 2 hour Third trimester  normal  COVID Vaccine  Yes   LAB RESULTS   Rhogam  B/Positive/-- (01/04 1036)  Blood Type B/Positive/-- (01/04 1036)   Baby Feeding Plan  breast Antibody Negative (01/04 1036)  Contraception  Discuss at 6wk Rubella 1.73 (01/04 1036)  Circumcision N/A RPR Non Reactive (05/15 0840)   Pediatrician   Undecided- list given 5/29 HBsAg Negative (01/04 1036)   Support Person  Family/FOB HCVAb Non Reactive (01/04 1036)   Prenatal Classes  HIV Non Reactive (05/15 0840)     BTL Consent  GBS   (For PCN allergy, check sensitivities)   VBAC Consent  Pap Diagnosis  Date Value Ref Range Status  11/21/2022   Final   - Negative for intraepithelial lesion or malignancy (NILM)         DME Rx [X]  BP cuff [ ]  Weight Scale Waterbirth  [ ]  Class [ ]  Consent [ ]  CNM visit  PHQ9 & GAD7 [x ] new OB [ x ] 28 weeks  [  ] 36 weeks Induction  [ ]  Orders Entered [ ] Foley Y/N      [redacted] weeks gestation of pregnancy    Term labor symptoms and general obstetric precautions including but not limited to vaginal bleeding, contractions, leaking of fluid and fetal movement were reviewed in detail with the patient. Please refer to After Visit Summary for other counseling recommendations.   Future Appointments  Date Time Provider Department Center  05/24/2023  9:40 AM Thomasene Ripple, DO [facility] None  05/29/2023  8:35 AM Lennart Pall, MD [facility] None  06/05/2023  7:15 AM [facility] SCHED ROOM [facility] None  06/05/2023  8:35 AM Sue Lush, FNP [facility] None  06/12/2023  8:35 AM Adam Phenix, MD [facility] None    [name], DO FMOB Fellow, Faculty practice [facility], Center for [facility] 05/22/23  11:16 AM

## 2023-05-23 ENCOUNTER — Inpatient Hospital Stay (HOSPITAL_COMMUNITY): Payer: Medicaid Other | Admitting: Anesthesiology

## 2023-05-23 DIAGNOSIS — O34211 Maternal care for low transverse scar from previous cesarean delivery: Secondary | ICD-10-CM

## 2023-05-23 DIAGNOSIS — Z3A37 37 weeks gestation of pregnancy: Secondary | ICD-10-CM

## 2023-05-23 LAB — COMPREHENSIVE METABOLIC PANEL
ALT: 22 U/L (ref 0–44)
AST: 23 U/L (ref 15–41)
Albumin: 2.6 g/dL — ABNORMAL LOW (ref 3.5–5.0)
Alkaline Phosphatase: 69 U/L (ref 38–126)
Anion gap: 12 (ref 5–15)
BUN: 5 mg/dL — ABNORMAL LOW (ref 6–20)
CO2: 18 mmol/L — ABNORMAL LOW (ref 22–32)
Calcium: 8.7 mg/dL — ABNORMAL LOW (ref 8.9–10.3)
Chloride: 102 mmol/L (ref 98–111)
Creatinine, Ser: 0.51 mg/dL (ref 0.44–1.00)
GFR, Estimated: >60 mL/min (ref >60)
Glucose, Bld: 74 mg/dL (ref 70–99)
Potassium: 3.5 mmol/L (ref 3.5–5.1)
Sodium: 132 mmol/L — ABNORMAL LOW (ref 135–145)
Total Bilirubin: 0.8 mg/dL (ref 0.3–1.2)
Total Protein: 6.3 g/dL — ABNORMAL LOW (ref 6.5–8.1)

## 2023-05-23 LAB — CBC WITH DIFFERENTIAL/PLATELET
Abs Immature Granulocytes: 0.04 10*3/uL (ref 0.00–0.07)
Basophils Absolute: 0 10*3/uL (ref 0.0–0.1)
Basophils Relative: 0 %
Eosinophils Absolute: 0.1 10*3/uL (ref 0.0–0.5)
Eosinophils Relative: 1 %
HCT: 34 % — ABNORMAL LOW (ref 36.0–46.0)
Hemoglobin: 11.5 g/dL — ABNORMAL LOW (ref 12.0–15.0)
Immature Granulocytes: 0 %
Lymphocytes Relative: 12 %
Lymphs Abs: 1.4 10*3/uL (ref 0.7–4.0)
MCH: 27.8 pg (ref 26.0–34.0)
MCHC: 33.8 g/dL (ref 30.0–36.0)
MCV: 82.1 fL (ref 80.0–100.0)
Monocytes Absolute: 0.7 10*3/uL (ref 0.1–1.0)
Monocytes Relative: 6 %
Neutro Abs: 9.3 10*3/uL — ABNORMAL HIGH (ref 1.7–7.7)
Neutrophils Relative %: 81 %
Platelets: 178 10*3/uL (ref 150–400)
RBC: 4.14 MIL/uL (ref 3.87–5.11)
RDW: 13.9 % (ref 11.5–15.5)
WBC: 11.6 10*3/uL — ABNORMAL HIGH (ref 4.0–10.5)
nRBC: 0 % (ref 0.0–0.2)

## 2023-05-23 LAB — PROTEIN / CREATININE RATIO, URINE
Creatinine, Urine: 96 mg/dL
Protein Creatinine Ratio: 0.3 mg/mg{Cre} — ABNORMAL HIGH (ref 0.00–0.15)
Total Protein, Urine: 29 mg/dL

## 2023-05-23 LAB — RPR: RPR Ser Ql: NONREACTIVE

## 2023-05-23 MED ORDER — FENTANYL-BUPIVACAINE-NACL 0.5-0.125-0.9 MG/250ML-% EP SOLN
12.0000 mL/h | EPIDURAL | Status: DC | PRN
  Administered 2023-05-23 – 2023-05-24 (×2): 12 mL/h via EPIDURAL
  Filled 2023-05-23 (×2): qty 250

## 2023-05-23 MED ORDER — HYDRALAZINE HCL 20 MG/ML IJ SOLN: 10.0000 mg | INTRAMUSCULAR | Status: DC | PRN

## 2023-05-23 MED ORDER — LIDOCAINE HCL (PF) 1 % IJ SOLN
INTRAMUSCULAR | Status: DC | PRN
  Administered 2023-05-23: 10 mL via EPIDURAL

## 2023-05-23 MED ORDER — EPHEDRINE 5 MG/ML INJ: 10.0000 mg | INTRAVENOUS | Status: DC | PRN

## 2023-05-23 MED ORDER — FENTANYL CITRATE (PF) 100 MCG/2ML IJ SOLN
INTRAMUSCULAR | Status: AC
  Filled 2023-05-23: qty 2

## 2023-05-23 MED ORDER — PHENYLEPHRINE 80 MCG/ML (10ML) SYRINGE FOR IV PUSH (FOR BLOOD PRESSURE SUPPORT)
80.0000 ug | PREFILLED_SYRINGE | INTRAVENOUS | Status: DC | PRN
  Filled 2023-05-23: qty 10

## 2023-05-23 MED ORDER — HYDRALAZINE HCL 20 MG/ML IJ SOLN
5.0000 mg | INTRAMUSCULAR | Status: DC | PRN
  Administered 2023-05-23: 5 mg via INTRAVENOUS
  Filled 2023-05-23: qty 1

## 2023-05-23 MED ORDER — MAGNESIUM SULFATE 40 GM/1000ML IV SOLN
2.0000 g/h | INTRAVENOUS | Status: DC
  Administered 2023-05-24: 2 g/h via INTRAVENOUS
  Filled 2023-05-23: qty 1000

## 2023-05-23 MED ORDER — HYDRALAZINE HCL 20 MG/ML IJ SOLN
5.0000 mg | INTRAMUSCULAR | Status: DC | PRN
  Administered 2023-05-24: 5 mg via INTRAVENOUS
  Filled 2023-05-23: qty 1

## 2023-05-23 MED ORDER — PHENYLEPHRINE 80 MCG/ML (10ML) SYRINGE FOR IV PUSH (FOR BLOOD PRESSURE SUPPORT): 80.0000 ug | PREFILLED_SYRINGE | INTRAVENOUS | Status: DC | PRN

## 2023-05-23 MED ORDER — LACTATED RINGERS IV SOLN
500.0000 mL | Freq: Once | INTRAVENOUS | Status: AC
  Administered 2023-05-23: 500 mL via INTRAVENOUS

## 2023-05-23 MED ORDER — LABETALOL HCL 5 MG/ML IV SOLN: 20.0000 mg | INTRAVENOUS | Status: DC | PRN

## 2023-05-23 MED ORDER — MAGNESIUM SULFATE 40 GM/1000ML IV SOLN
INTRAVENOUS | Status: AC
  Filled 2023-05-23: qty 1000

## 2023-05-23 MED ORDER — FENTANYL CITRATE (PF) 100 MCG/2ML IJ SOLN
100.0000 ug | INTRAMUSCULAR | Status: DC | PRN
  Administered 2023-05-23 (×2): 100 ug via INTRAVENOUS
  Filled 2023-05-23: qty 2

## 2023-05-23 MED ORDER — LABETALOL HCL 5 MG/ML IV SOLN: 40.0000 mg | INTRAVENOUS | Status: DC | PRN

## 2023-05-23 MED ORDER — LACTATED RINGERS IV SOLN: INTRAVENOUS | Status: DC

## 2023-05-23 MED ORDER — MAGNESIUM SULFATE BOLUS VIA INFUSION
4.0000 g | Freq: Once | INTRAVENOUS | Status: AC
  Administered 2023-05-23: 4 g via INTRAVENOUS
  Filled 2023-05-23: qty 1000

## 2023-05-23 MED ORDER — HYDRALAZINE HCL 20 MG/ML IJ SOLN
10.0000 mg | INTRAMUSCULAR | Status: DC | PRN
  Administered 2023-05-23: 10 mg via INTRAVENOUS

## 2023-05-23 MED ORDER — DIPHENHYDRAMINE HCL 50 MG/ML IJ SOLN: 12.5000 mg | INTRAMUSCULAR | Status: DC | PRN

## 2023-05-23 NOTE — Progress Notes (Signed)
Labor Progress Note Dawn Hoffman is a 32 y.o. G3P0110 at [redacted]w[redacted]d presented for IOL 2/2 cHTN, TOLAC.   S: Patient is comfortable with epidural in place  O:  BP (!) 137/92   Pulse 72   Temp 98.2 F (36.8 C) (Oral)   Resp 15   Ht 5' (1.524 m)   Wt 100.9 kg   LMP 09/05/2022   SpO2 98%   BMI 43.43 kg/m  EFM: 130bpm/moderate/+accels, no decels  CVE: Dilation: 1 Effacement (%): Thick Station: Ballotable Presentation: Vertex Exam by:: Dr. Nobie Putnam   A&P: 32 y.o. G3P0110 [redacted]w[redacted]d here for IOL 2/2 cHTN, TOLAC.  #Labor: Patient is still fingertip.  Foley balloon placed without difficulty.  Will monitor and AROM once Foley balloon is out. #Pain: Place #FWB: Cat I  #GBS negative  cHTN BP 137/92 -Continue home labetalol 200 BID  Dawn Savage, MD 1:18 PM

## 2023-05-23 NOTE — Progress Notes (Signed)
Labor Progress Note Dawn Hoffman is a 32 y.o. G3P0110 at [redacted]w[redacted]d presented for IOL 2/2 cHTN, TOLAC.   S: Comfortable with epidural in place.  Foley balloon out. O:  BP 133/82   Pulse 78   Temp 98 F (36.7 C) (Oral)   Resp 15   Ht 5' (1.524 m)   Wt 100.9 kg   LMP 09/05/2022   SpO2 98%   BMI 43.43 kg/m  EFM: 130bpm/moderate/+accels, no decels  CVE: Dilation: 3.5 Effacement (%): 70 Station: Ballotable Presentation: Vertex Exam by:: Dr. Nobie Putnam   A&P: 32 y.o. G3P0110 [redacted]w[redacted]d here for IOL 2/2 cHTN, TOLAC.  #Labor: Foley balloon out at this check.  3-1/2 to 4 cm dilated.  Had not well applied to the cervix.  Will titrate Pitocin and plan on AROM in approximately 1 hour #Pain: Epidural #FWB: Cat I  #GBS negative  cHTN -Continue home labetalol 200 BID  Celedonio Savage, MD 5:55 PM

## 2023-05-23 NOTE — Anesthesia Procedure Notes (Signed)
Epidural Patient location during procedure: OB Start time: 05/23/2023 11:35 AM End time: 05/23/2023 11:45 AM  Staffing Anesthesiologist: Leilani Able, MD Performed: anesthesiologist   Preanesthetic Checklist Completed: patient identified, IV checked, site marked, risks and benefits discussed, surgical consent, monitors and equipment checked, pre-op evaluation and timeout performed  Epidural Patient position: sitting Prep: DuraPrep and site prepped and draped Patient monitoring: continuous pulse ox and blood pressure Approach: midline Location: L3-L4 Injection technique: LOR air  Needle:  Needle type: Tuohy  Needle gauge: 17 G Needle length: 9 cm and 9 Needle insertion depth: 7 cm Catheter type: closed end flexible Catheter size: 19 Gauge Catheter at skin depth: 12 cm Test dose: negative and Other  Assessment Events: blood not aspirated, no cerebrospinal fluid, injection not painful, no injection resistance, no paresthesia and negative IV test  Additional Notes Reason for block:procedure for pain

## 2023-05-23 NOTE — Progress Notes (Addendum)
Labor Progress Note Dawn Hoffman is a 32 y.o. G3P0110 at [redacted]w[redacted]d presented for IOL 2/2 cHTN, TOLAC.   S: Patient is doing well.  Difficulty tolerating checks.  O:  BP 137/63   Pulse 76   Temp 98.2 F (36.8 C) (Oral)   Resp 15   Ht 5' (1.524 m)   Wt 100.9 kg   LMP 09/05/2022   BMI 43.43 kg/m  EFM: 130bpm/moderate/+accels, no decels  CVE: Dilation: Fingertip Effacement (%): Thick Station: Ballotable Presentation: Vertex Exam by:: Dr. Nobie Putnam   A&P: 32 y.o. G3P0110 [redacted]w[redacted]d here for IOL 2/2 cHTN, TOLAC.  #Labor: Patient remains fingertip.  Discussed Foley balloon placement but unable to do it due to patient intolerance.  Discussed early epidural and patient is agreeable. Discussed at length TOLAC and discussed continued desire for vaginal delivery vs repeat cesarean delivery. Patient desires continued TOLAC.  #Pain: Maternally supported, planning for epidural.  #FWB: Cat I  #GBS negative  cHTN BP 137/62 -Continue home labetalol 200 BID  Celedonio Savage, MD 11:52 AM

## 2023-05-23 NOTE — Progress Notes (Signed)
RN notified CN of concerns over epidural placement.  CN contacted unit management to speak with pt.  RN contacted pt's attending MD to come and speak with pt and husband about plan of care moving forward.

## 2023-05-23 NOTE — Progress Notes (Signed)
Labor Progress Note Dawn Hoffman is a 32 y.o. G3P0110 at [redacted]w[redacted]d presented for IOL 2/2 cHTN, TOLAC.   S: Having discomfort with contractions.   O:  BP (!) 140/70   Pulse 79   Temp 97.7 F (36.5 C) (Oral)   Resp 16   Ht 5' (1.524 m)   Wt 100.9 kg   LMP 09/05/2022   BMI 43.43 kg/m  EFM: 135bpm/moderate/+accels, no decels  CVE: Dilation: Fingertip Effacement (%): Thick Station: Ballotable Presentation: Vertex (by [place]) Exam by:: Dr. Salvadore Dom   A&P: 32 y.o. [name] [redacted]w[redacted]d here for IOL 2/2 cHTN, TOLAC.  #Labor: Unchanged. Continue to titrate pitocin. Assess for FB at next exam.  #Pain: Maternally supported, planning for epidural. Can have IV fentanyl.  #FWB: Cat I  #GBS negative  cHTN BP 140/70 -Continue home labetalol 200 BID  Dawn Rynders Autry-Lott, DO 1:42 AM

## 2023-05-23 NOTE — Progress Notes (Signed)
Labor Progress Note  Dawn Hoffman is a 32 y.o. G3P0110 at [redacted]w[redacted]d presented for IOL cHTN  S: Notified of syringe blood pressures, protein creatinine ratio 0.3  O:  BP (!) 160/78 (BP Location: Right Arm)   Pulse 74   Temp 98.3 F (36.8 C) (Oral)   Resp 15   Ht 5' (1.524 m)   Wt 100.9 kg   LMP 09/05/2022   SpO2 98%   BMI 43.43 kg/m  EFM:140 bpm/Moderate variability/ 15x15 accels/ None decels CAT: 1 Toco: regular, every 2-4 minutes   CVE: Dilation: 4.5 Effacement (%): 60 Cervical Position: Middle Station: -2 Presentation: Vertex Exam by:: [name]   A&P: 32 y.o. [name] [redacted]w[redacted]d  here for IOL as above  #Labor: Progressing well. Cont pit 2x2 #Pain: Family/Friend support and Epidural #FWB: CAT 1 #GBS negative #cHTN SIPE with severe features: P/C 0.3.  Severe range blood pressures noted.  Magnesium gtt.  IV hydralazine protocol. Cont PO labetolol 200 BID  [name], DO FMOB Fellow, Faculty practice [facility], Center for [facility] 05/23/23  11:44 PM

## 2023-05-23 NOTE — Anesthesia Preprocedure Evaluation (Signed)
Anesthesia Evaluation  Patient identified by MRN, date of birth, ID band Patient awake    Reviewed: Allergy & Precautions, NPO status , Patient's Chart, lab work & pertinent test results, reviewed documented beta blocker date and time   Airway Mallampati: II       Dental no notable dental hx.    Pulmonary asthma    Pulmonary exam normal breath sounds clear to auscultation       Cardiovascular hypertension, Pt. on medications and Pt. on home beta blockers Normal cardiovascular exam     Neuro/Psych  Headaches    GI/Hepatic negative GI ROS, Neg liver ROS,,,  Endo/Other    Morbid obesity  Renal/GU negative Renal ROS  negative genitourinary   Musculoskeletal negative musculoskeletal ROS (+)    Abdominal  (+) + obese  Peds  Hematology  (+) Blood dyscrasia, anemia   Anesthesia Other Findings   Reproductive/Obstetrics (+) Pregnancy                             Anesthesia Physical Anesthesia Plan  ASA: III  Anesthesia Plan: Epidural   Post-op Pain Management:    Induction:   PONV Risk Score and Plan:   Airway Management Planned:   Additional Equipment:   Intra-op Plan:   Post-operative Plan:   Informed Consent:   Plan Discussed with:   Anesthesia Plan Comments:         Anesthesia Quick Evaluation

## 2023-05-23 NOTE — Progress Notes (Signed)
Labor Progress Note  Dawn Hoffman is a 32 y.o. G3P0110 at [redacted]w[redacted]d presented for IOL cHTN  S: pt doing well, feeling some pressure in her pelvis  O:  BP (!) 147/75   Pulse 74   Temp 98 F (36.7 C) (Oral)   Resp 15   Ht 5' (1.524 m)   Wt 100.9 kg   LMP 09/05/2022   SpO2 98%   BMI 43.43 kg/m  EFM:140 bpm/Moderate variability/ 15x15 accels/ None decels CAT: 1 Toco: regular, every 2-4 minutes   CVE: Dilation: 4.5 Effacement (%): 60 Cervical Position: Middle Station: -2 Presentation: Vertex Exam by:: [name]   A&P: 32 y.o. [id number] [redacted]w[redacted]d  here for IOL as above  #Labor: Progressing well. Cont pit 2x2 #Pain: Family/Friend support and Epidural #FWB: CAT 1 #GBS negative #cHTN: BP elevated. PreE labs collected. IV hydralazine protocol. Cont PO labetolol 200 BID  [name], DO FMOB Fellow, Faculty practice [facility], Center for [facility] 05/23/23  10:05 PM

## 2023-05-23 NOTE — Progress Notes (Signed)
Labor Progress Note Dawn Hoffman is a 32 y.o. G3P0110 at [redacted]w[redacted]d presented for IOL 2/2 cHTN, TOLAC.   S: Extremely comfortable with epidural in place.  O:  BP 136/86   Pulse 80   Temp 98 F (36.7 C) (Oral)   Resp 15   Ht 5' (1.524 m)   Wt 100.9 kg   LMP 09/05/2022   SpO2 98%   BMI 43.43 kg/m  EFM: 130bpm/moderate/+accels, no decels  CVE: Dilation: 4 Effacement (%): 70 Cervical Position: Middle Station: -3, -2 Presentation: Vertex Exam by:: Dr. Nobie Putnam   A&P: 32 y.o. G3P0110 [redacted]w[redacted]d here for IOL 2/2 cHTN, TOLAC.  #Labor: Progressing well, AROM with clear fluid.  Continue to monitor and consider IUPC if needed. #Pain: Epidural #FWB: Cat I  #GBS negative  cHTN -Continue home labetalol 200 BID  Celedonio Savage, MD 6:56 PM

## 2023-05-23 NOTE — Progress Notes (Signed)
Labor Progress Note Dawn Hoffman is a 32 y.o. G3P0110 at [redacted]w[redacted]d presented for IOL 2/2 cHTN, TOLAC.   S: Having discomfort with contractions.   O:  BP 137/62   Pulse 64   Temp 97.7 F (36.5 C) (Oral)   Resp 16   Ht 5' (1.524 m)   Wt 100.9 kg   LMP 09/05/2022   BMI 43.43 kg/m  EFM: 130bpm/moderate/+accels, no decels  CVE: Dilation: Fingertip Effacement (%): Thick Station: Ballotable Presentation: Vertex (by [place]) Exam by:: Dr. Salvadore Dom   A&P: 32 y.o. [name] [redacted]w[redacted]d here for IOL 2/2 cHTN, TOLAC.  #Labor: Given IV fentanyl prior to vaginal exam. This provider was called to a delivery. RN assessment given that the patient is now sleeping and may not want to try FB. For now continue pitocin. Contraction pattern is starting to become more regular. Consider early epidural.  #Pain: Maternally supported, planning for epidural. Can have IV fentanyl.  #FWB: Cat I  #GBS negative  cHTN BP 137/62 -Continue home labetalol 200 BID  Lashawn Orrego Autry-Lott, DO 6:45 AM

## 2023-05-24 ENCOUNTER — Encounter (HOSPITAL_COMMUNITY)
Admission: RE | Disposition: A | Discharge status: Home / Self Care | Payer: Self-pay | Source: Home / Self Care | Attending: Obstetrics and Gynecology

## 2023-05-24 ENCOUNTER — Ambulatory Visit: Payer: Medicaid Other | Attending: Cardiology | Admitting: Cardiology

## 2023-05-24 ENCOUNTER — Encounter (HOSPITAL_COMMUNITY): Payer: Self-pay | Admitting: Obstetrics and Gynecology

## 2023-05-24 ENCOUNTER — Encounter: Payer: Self-pay | Admitting: Cardiology

## 2023-05-24 DIAGNOSIS — O09293 Supervision of pregnancy with other poor reproductive or obstetric history, third trimester: Secondary | ICD-10-CM

## 2023-05-24 DIAGNOSIS — O1413 Severe pre-eclampsia, third trimester: Secondary | ICD-10-CM | POA: Diagnosis not present

## 2023-05-24 DIAGNOSIS — Z3A37 37 weeks gestation of pregnancy: Secondary | ICD-10-CM

## 2023-05-24 DIAGNOSIS — O9832 Other infections with a predominantly sexual mode of transmission complicating childbirth: Secondary | ICD-10-CM

## 2023-05-24 DIAGNOSIS — O34211 Maternal care for low transverse scar from previous cesarean delivery: Secondary | ICD-10-CM

## 2023-05-24 DIAGNOSIS — O1414 Severe pre-eclampsia complicating childbirth: Secondary | ICD-10-CM

## 2023-05-24 DIAGNOSIS — Z98891 History of uterine scar from previous surgery: Secondary | ICD-10-CM

## 2023-05-24 HISTORY — DX: Severe pre-eclampsia, third trimester: O14.13

## 2023-05-24 LAB — TYPE AND SCREEN
ABO/RH(D): B POS
Unit division: 0
Unit division: 0

## 2023-05-24 LAB — CBC
HCT: 36.6 % (ref 36.0–46.0)
Hemoglobin: 12 g/dL (ref 12.0–15.0)
MCH: 27.3 pg (ref 26.0–34.0)
MCHC: 32.8 g/dL (ref 30.0–36.0)
MCV: 83.2 fL (ref 80.0–100.0)
Platelets: 223 10*3/uL (ref 150–400)
RBC: 4.4 MIL/uL (ref 3.87–5.11)
RDW: 14.2 % (ref 11.5–15.5)
WBC: 21.1 10*3/uL — ABNORMAL HIGH (ref 4.0–10.5)
nRBC: 0 % (ref 0.0–0.2)

## 2023-05-24 LAB — COMPREHENSIVE METABOLIC PANEL
ALT: 27 U/L (ref 0–44)
AST: 32 U/L (ref 15–41)
Albumin: 2.6 g/dL — ABNORMAL LOW (ref 3.5–5.0)
Alkaline Phosphatase: 71 U/L (ref 38–126)
Anion gap: 14 (ref 5–15)
BUN: <5 mg/dL — ABNORMAL LOW (ref 6–20)
CO2: 15 mmol/L — ABNORMAL LOW (ref 22–32)
Calcium: 7.9 mg/dL — ABNORMAL LOW (ref 8.9–10.3)
Chloride: 99 mmol/L (ref 98–111)
Creatinine, Ser: 0.56 mg/dL (ref 0.44–1.00)
GFR, Estimated: >60 mL/min (ref >60)
Glucose, Bld: 76 mg/dL (ref 70–99)
Potassium: 4.8 mmol/L (ref 3.5–5.1)
Sodium: 128 mmol/L — ABNORMAL LOW (ref 135–145)
Total Bilirubin: 1.2 mg/dL (ref 0.3–1.2)
Total Protein: 6.1 g/dL — ABNORMAL LOW (ref 6.5–8.1)

## 2023-05-24 LAB — PREPARE RBC (CROSSMATCH)

## 2023-05-24 LAB — BPAM RBC: Blood Product Expiration Date: 202408172359

## 2023-05-24 SURGERY — Surgical Case
Anesthesia: Epidural

## 2023-05-24 MED ORDER — OXYCODONE HCL 5 MG/5ML PO SOLN: 5.0000 mg | Freq: Once | ORAL | Status: DC | PRN

## 2023-05-24 MED ORDER — MEPERIDINE HCL 25 MG/ML IJ SOLN
6.2500 mg | INTRAMUSCULAR | Status: DC | PRN
  Administered 2023-05-24: 6.25 mg via INTRAVENOUS

## 2023-05-24 MED ORDER — MISOPROSTOL 200 MCG PO TABS
ORAL_TABLET | ORAL | Status: AC
  Filled 2023-05-24: qty 2

## 2023-05-24 MED ORDER — KETOROLAC TROMETHAMINE 30 MG/ML IJ SOLN
30.0000 mg | Freq: Four times a day (QID) | INTRAMUSCULAR | Status: AC
  Administered 2023-05-24 – 2023-05-25 (×4): 30 mg via INTRAVENOUS
  Filled 2023-05-24 (×4): qty 1

## 2023-05-24 MED ORDER — KETOROLAC TROMETHAMINE 30 MG/ML IJ SOLN: 30.0000 mg | Freq: Four times a day (QID) | INTRAMUSCULAR | Status: AC | PRN

## 2023-05-24 MED ORDER — ACETAMINOPHEN 10 MG/ML IV SOLN
INTRAVENOUS | Status: AC
  Filled 2023-05-24: qty 100

## 2023-05-24 MED ORDER — DIPHENHYDRAMINE HCL 25 MG PO CAPS: 25.0000 mg | ORAL_CAPSULE | Freq: Four times a day (QID) | ORAL | Status: DC | PRN

## 2023-05-24 MED ORDER — ONDANSETRON HCL 4 MG/2ML IJ SOLN
4.0000 mg | Freq: Three times a day (TID) | INTRAMUSCULAR | Status: DC | PRN
  Administered 2023-05-24: 4 mg via INTRAVENOUS

## 2023-05-24 MED ORDER — FENTANYL CITRATE (PF) 100 MCG/2ML IJ SOLN
INTRAMUSCULAR | Status: DC | PRN
  Administered 2023-05-24: 50 ug via INTRAVENOUS

## 2023-05-24 MED ORDER — FUROSEMIDE 20 MG PO TABS
20.0000 mg | ORAL_TABLET | Freq: Every day | ORAL | Status: DC
  Administered 2023-05-25 – 2023-05-27 (×3): 20 mg via ORAL
  Filled 2023-05-24 (×4): qty 1

## 2023-05-24 MED ORDER — DEXAMETHASONE SODIUM PHOSPHATE 10 MG/ML IJ SOLN
INTRAMUSCULAR | Status: AC
  Filled 2023-05-24: qty 1

## 2023-05-24 MED ORDER — TRANEXAMIC ACID-NACL 1000-0.7 MG/100ML-% IV SOLN
1000.0000 mg | Freq: Once | INTRAVENOUS | Status: AC
  Administered 2023-05-24: 1000 mg via INTRAVENOUS

## 2023-05-24 MED ORDER — MEPERIDINE HCL 25 MG/ML IJ SOLN: 6.2500 mg | INTRAMUSCULAR | Status: DC | PRN

## 2023-05-24 MED ORDER — SIMETHICONE 80 MG PO CHEW
80.0000 mg | CHEWABLE_TABLET | Freq: Three times a day (TID) | ORAL | Status: DC
  Administered 2023-05-24 – 2023-05-27 (×6): 80 mg via ORAL
  Filled 2023-05-24 (×6): qty 1

## 2023-05-24 MED ORDER — ACETAMINOPHEN 500 MG PO TABS: 1000.0000 mg | ORAL_TABLET | Freq: Four times a day (QID) | ORAL | Status: DC

## 2023-05-24 MED ORDER — MISOPROSTOL 200 MCG PO TABS
ORAL_TABLET | ORAL | Status: DC | PRN
  Administered 2023-05-24: 400 ug via BUCCAL

## 2023-05-24 MED ORDER — VITAMIN K1 1 MG/0.5ML IJ SOLN
INTRAMUSCULAR | Status: AC
  Filled 2023-05-24: qty 0.5

## 2023-05-24 MED ORDER — FENTANYL CITRATE (PF) 100 MCG/2ML IJ SOLN
INTRAMUSCULAR | Status: AC
  Filled 2023-05-24: qty 2

## 2023-05-24 MED ORDER — OXYCODONE HCL 5 MG PO TABS
5.0000 mg | ORAL_TABLET | ORAL | Status: DC | PRN
  Administered 2023-05-27: 5 mg via ORAL
  Filled 2023-05-24: qty 1

## 2023-05-24 MED ORDER — MAGNESIUM SULFATE 40 GM/1000ML IV SOLN
2.0000 g/h | INTRAVENOUS | Status: AC
  Administered 2023-05-24: 2 g/h via INTRAVENOUS

## 2023-05-24 MED ORDER — ACETAMINOPHEN 500 MG PO TABS
1000.0000 mg | ORAL_TABLET | Freq: Four times a day (QID) | ORAL | Status: DC
  Administered 2023-05-24 – 2023-05-27 (×12): 1000 mg via ORAL
  Filled 2023-05-24 (×12): qty 2

## 2023-05-24 MED ORDER — MENTHOL 3 MG MT LOZG: 1.0000 | LOZENGE | OROMUCOSAL | Status: DC | PRN

## 2023-05-24 MED ORDER — ENOXAPARIN SODIUM 60 MG/0.6ML IJ SOSY
50.0000 mg | PREFILLED_SYRINGE | INTRAMUSCULAR | Status: DC
  Administered 2023-05-24 – 2023-05-26 (×3): 50 mg via SUBCUTANEOUS
  Filled 2023-05-24 (×3): qty 0.6

## 2023-05-24 MED ORDER — KETOROLAC TROMETHAMINE 30 MG/ML IJ SOLN
30.0000 mg | Freq: Four times a day (QID) | INTRAMUSCULAR | Status: AC | PRN
  Filled 2023-05-24: qty 1

## 2023-05-24 MED ORDER — OXYCODONE HCL 5 MG PO TABS: 5.0000 mg | ORAL_TABLET | Freq: Once | ORAL | Status: DC | PRN

## 2023-05-24 MED ORDER — ONDANSETRON HCL 4 MG/2ML IJ SOLN
INTRAMUSCULAR | Status: AC
  Filled 2023-05-24: qty 2

## 2023-05-24 MED ORDER — OXYTOCIN-SODIUM CHLORIDE 30-0.9 UT/500ML-% IV SOLN
INTRAVENOUS | Status: AC
  Filled 2023-05-24: qty 500

## 2023-05-24 MED ORDER — MEPERIDINE HCL 25 MG/ML IJ SOLN
INTRAMUSCULAR | Status: AC
  Filled 2023-05-24: qty 1

## 2023-05-24 MED ORDER — CALCIUM CARBONATE ANTACID 500 MG PO CHEW
CHEWABLE_TABLET | ORAL | Status: AC
  Filled 2023-05-24: qty 2

## 2023-05-24 MED ORDER — ERYTHROMYCIN 5 MG/GM OP OINT
TOPICAL_OINTMENT | OPHTHALMIC | Status: AC
  Filled 2023-05-24: qty 1

## 2023-05-24 MED ORDER — FENTANYL CITRATE (PF) 100 MCG/2ML IJ SOLN
INTRAMUSCULAR | Status: DC | PRN
  Administered 2023-05-24: 100 ug via EPIDURAL

## 2023-05-24 MED ORDER — DEXAMETHASONE SODIUM PHOSPHATE 10 MG/ML IJ SOLN
INTRAMUSCULAR | Status: DC | PRN
  Administered 2023-05-24: 10 mg via INTRAVENOUS

## 2023-05-24 MED ORDER — SODIUM CHLORIDE 0.9% IV SOLUTION
Freq: Once | INTRAVENOUS | Status: AC
  Administered 2023-05-24: 10 mL/h via INTRAVENOUS

## 2023-05-24 MED ORDER — KETOROLAC TROMETHAMINE 30 MG/ML IJ SOLN: 30.0000 mg | Freq: Once | INTRAMUSCULAR | Status: DC | PRN

## 2023-05-24 MED ORDER — GABAPENTIN 100 MG PO CAPS
200.0000 mg | ORAL_CAPSULE | Freq: Every day | ORAL | Status: DC
  Administered 2023-05-24 – 2023-05-26 (×3): 200 mg via ORAL
  Filled 2023-05-24 (×3): qty 2

## 2023-05-24 MED ORDER — OXYTOCIN-SODIUM CHLORIDE 30-0.9 UT/500ML-% IV SOLN
INTRAVENOUS | Status: DC | PRN
  Administered 2023-05-24: 300 mL via INTRAVENOUS

## 2023-05-24 MED ORDER — SODIUM CHLORIDE 0.9 % IR SOLN
Status: DC | PRN
  Administered 2023-05-24: 1

## 2023-05-24 MED ORDER — OXYTOCIN-SODIUM CHLORIDE 30-0.9 UT/500ML-% IV SOLN: 2.5000 [IU]/h | INTRAVENOUS | Status: AC

## 2023-05-24 MED ORDER — DIBUCAINE (PERIANAL) 1 % EX OINT: 1.0000 | TOPICAL_OINTMENT | CUTANEOUS | Status: DC | PRN

## 2023-05-24 MED ORDER — TRANEXAMIC ACID-NACL 1000-0.7 MG/100ML-% IV SOLN
INTRAVENOUS | Status: AC
  Filled 2023-05-24: qty 100

## 2023-05-24 MED ORDER — LIDOCAINE-EPINEPHRINE (PF) 2 %-1:200000 IJ SOLN
INTRAMUSCULAR | Status: DC | PRN
  Administered 2023-05-24: 3 mL via EPIDURAL
  Administered 2023-05-24 (×2): 2 mL via EPIDURAL
  Administered 2023-05-24: 10 mL via EPIDURAL

## 2023-05-24 MED ORDER — STERILE WATER FOR IRRIGATION IR SOLN
Status: DC | PRN
  Administered 2023-05-24: 1000 mL

## 2023-05-24 MED ORDER — BUPIVACAINE HCL (PF) 0.25 % IJ SOLN
INTRAMUSCULAR | Status: DC | PRN
  Administered 2023-05-24: 8 mL via EPIDURAL

## 2023-05-24 MED ORDER — DIPHENHYDRAMINE HCL 25 MG PO CAPS: 25.0000 mg | ORAL_CAPSULE | ORAL | Status: DC | PRN

## 2023-05-24 MED ORDER — AMISULPRIDE (ANTIEMETIC) 5 MG/2ML IV SOLN: 10.0000 mg | Freq: Once | INTRAVENOUS | Status: DC | PRN

## 2023-05-24 MED ORDER — SODIUM CHLORIDE 0.9 % IV SOLN
500.0000 mg | INTRAVENOUS | Status: AC
  Administered 2023-05-24: 500 mg via INTRAVENOUS

## 2023-05-24 MED ORDER — DEXMEDETOMIDINE HCL IN NACL 80 MCG/20ML IV SOLN
INTRAVENOUS | Status: DC | PRN
  Administered 2023-05-24: 8 ug via INTRAVENOUS

## 2023-05-24 MED ORDER — IBUPROFEN 600 MG PO TABS
600.0000 mg | ORAL_TABLET | Freq: Four times a day (QID) | ORAL | Status: DC
  Administered 2023-05-25 – 2023-05-27 (×8): 600 mg via ORAL
  Filled 2023-05-24 (×8): qty 1

## 2023-05-24 MED ORDER — SODIUM CHLORIDE 0.9% FLUSH: 3.0000 mL | INTRAVENOUS | Status: DC | PRN

## 2023-05-24 MED ORDER — MORPHINE SULFATE (PF) 0.5 MG/ML IJ SOLN
INTRAMUSCULAR | Status: AC
  Filled 2023-05-24: qty 10

## 2023-05-24 MED ORDER — NALOXONE HCL 0.4 MG/ML IJ SOLN: 0.4000 mg | INTRAMUSCULAR | Status: DC | PRN

## 2023-05-24 MED ORDER — VANCOMYCIN HCL IN DEXTROSE 1-5 GM/200ML-% IV SOLN
1000.0000 mg | Freq: Two times a day (BID) | INTRAVENOUS | Status: DC
  Filled 2023-05-24: qty 200

## 2023-05-24 MED ORDER — NIFEDIPINE ER OSMOTIC RELEASE 30 MG PO TB24
30.0000 mg | ORAL_TABLET | Freq: Every day | ORAL | Status: DC
  Administered 2023-05-24 – 2023-05-27 (×4): 30 mg via ORAL
  Filled 2023-05-24 (×5): qty 1

## 2023-05-24 MED ORDER — ZOLPIDEM TARTRATE 5 MG PO TABS: 5.0000 mg | ORAL_TABLET | Freq: Every evening | ORAL | Status: DC | PRN

## 2023-05-24 MED ORDER — SOD CITRATE-CITRIC ACID 500-334 MG/5ML PO SOLN
30.0000 mL | ORAL | Status: AC
  Administered 2023-05-24: 30 mL via ORAL

## 2023-05-24 MED ORDER — COCONUT OIL OIL: 1.0000 | TOPICAL_OIL | Status: DC | PRN

## 2023-05-24 MED ORDER — HYDROMORPHONE HCL 1 MG/ML IJ SOLN: 0.2500 mg | INTRAMUSCULAR | Status: DC | PRN

## 2023-05-24 MED ORDER — VANCOMYCIN HCL IN DEXTROSE 1-5 GM/200ML-% IV SOLN
1000.0000 mg | Freq: Three times a day (TID) | INTRAVENOUS | Status: AC
  Administered 2023-05-24 – 2023-05-26 (×5): 1000 mg via INTRAVENOUS
  Filled 2023-05-24 (×5): qty 200

## 2023-05-24 MED ORDER — VANCOMYCIN HCL 10 G IV SOLR
2000.0000 mg | Freq: Once | INTRAVENOUS | Status: AC
  Administered 2023-05-24: 2000 mg via INTRAVENOUS
  Filled 2023-05-24: qty 40

## 2023-05-24 MED ORDER — MORPHINE SULFATE (PF) 0.5 MG/ML IJ SOLN
INTRAMUSCULAR | Status: DC | PRN
  Administered 2023-05-24: 3 mg via EPIDURAL

## 2023-05-24 MED ORDER — ONDANSETRON HCL 4 MG/2ML IJ SOLN: 4.0000 mg | Freq: Once | INTRAMUSCULAR | Status: DC | PRN

## 2023-05-24 MED ORDER — NALOXONE HCL 4 MG/10ML IJ SOLN: 1.0000 ug/kg/h | INTRAVENOUS | Status: DC | PRN

## 2023-05-24 MED ORDER — ACETAMINOPHEN 10 MG/ML IV SOLN
INTRAVENOUS | Status: DC | PRN
  Administered 2023-05-24: 1000 mg via INTRAVENOUS

## 2023-05-24 MED ORDER — PRENATAL MULTIVITAMIN CH
1.0000 | ORAL_TABLET | Freq: Every day | ORAL | Status: DC
  Administered 2023-05-25 – 2023-05-27 (×3): 1 via ORAL
  Filled 2023-05-24 (×4): qty 1

## 2023-05-24 MED ORDER — SODIUM CHLORIDE 0.9 % IV SOLN
2.0000 g | INTRAVENOUS | Status: AC
  Administered 2023-05-24 – 2023-05-25 (×2): 2 g via INTRAVENOUS
  Filled 2023-05-24 (×3): qty 20

## 2023-05-24 MED ORDER — SENNOSIDES-DOCUSATE SODIUM 8.6-50 MG PO TABS
2.0000 | ORAL_TABLET | Freq: Every day | ORAL | Status: DC
  Administered 2023-05-25 – 2023-05-27 (×3): 2 via ORAL
  Filled 2023-05-24 (×3): qty 2

## 2023-05-24 MED ORDER — CALCIUM CARBONATE ANTACID 500 MG PO CHEW
800.0000 mg | CHEWABLE_TABLET | Freq: Once | ORAL | Status: AC
  Administered 2023-05-24: 800 mg via ORAL

## 2023-05-24 MED ORDER — DIPHENHYDRAMINE HCL 50 MG/ML IJ SOLN: 12.5000 mg | INTRAMUSCULAR | Status: DC | PRN

## 2023-05-24 MED ORDER — SCOPOLAMINE 1 MG/3DAYS TD PT72
1.0000 | MEDICATED_PATCH | Freq: Once | TRANSDERMAL | Status: AC
  Administered 2023-05-24: 1.5 mg via TRANSDERMAL
  Filled 2023-05-24: qty 1

## 2023-05-24 MED ORDER — SIMETHICONE 80 MG PO CHEW: 80.0000 mg | CHEWABLE_TABLET | ORAL | Status: DC | PRN

## 2023-05-24 MED ORDER — WITCH HAZEL-GLYCERIN EX PADS: 1.0000 | MEDICATED_PAD | CUTANEOUS | Status: DC | PRN

## 2023-05-24 MED ORDER — SODIUM CHLORIDE 0.9 % IV SOLN
25.0000 mg | INTRAVENOUS | Status: DC | PRN
  Administered 2023-05-24: 25 mg via INTRAVENOUS
  Filled 2023-05-24: qty 1
  Filled 2023-05-24: qty 25

## 2023-05-24 SURGICAL SUPPLY — 32 items
APL PRP STRL LF DISP 70% ISPRP (MISCELLANEOUS) ×2
CHLORAPREP W/TINT 26 (MISCELLANEOUS) ×2 IMPLANT
CLAMP UMBILICAL CORD (MISCELLANEOUS) ×1 IMPLANT
CLOTH BEACON ORANGE TIMEOUT ST (SAFETY) ×1 IMPLANT
DRAIN JACKSON PRT FLT 7MM (DRAIN) IMPLANT
DRSG OPSITE POSTOP 4X10 (GAUZE/BANDAGES/DRESSINGS) ×1 IMPLANT
ELECT REM PT RETURN 9FT ADLT (ELECTROSURGICAL) ×1
ELECTRODE REM PT RTRN 9FT ADLT (ELECTROSURGICAL) ×1 IMPLANT
EVACUATOR SILICONE 100CC (DRAIN) IMPLANT
EXTRACTOR VACUUM M CUP 4 TUBE (SUCTIONS) IMPLANT
GAUZE PAD ABD 7.5X8 STRL (GAUZE/BANDAGES/DRESSINGS) IMPLANT
GAUZE SPONGE 4X4 12PLY STRL LF (GAUZE/BANDAGES/DRESSINGS) IMPLANT
GLOVE BIO SURGEON STRL SZ7 (GLOVE) ×1 IMPLANT
GLOVE BIOGEL PI IND STRL 7.0 (GLOVE) ×2 IMPLANT
GOWN STRL REUS W/TWL LRG LVL3 (GOWN DISPOSABLE) ×2 IMPLANT
KIT ABG SYR 3ML LUER SLIP (SYRINGE) IMPLANT
MAT PREVALON FULL STRYKER (MISCELLANEOUS) IMPLANT
NDL HYPO 25X5/8 SAFETYGLIDE (NEEDLE) ×1 IMPLANT
NEEDLE HYPO 25X5/8 SAFETYGLIDE (NEEDLE) ×1 IMPLANT
NS IRRIG 1000ML POUR BTL (IV SOLUTION) ×1 IMPLANT
PACK C SECTION WH (CUSTOM PROCEDURE TRAY) ×1 IMPLANT
PAD OB MATERNITY 4.3X12.25 (PERSONAL CARE ITEMS) ×1 IMPLANT
RTRCTR C-SECT PINK 25CM LRG (MISCELLANEOUS) ×1 IMPLANT
SUT MON AB 2-0 SH 27 (SUTURE) ×1
SUT MON AB 2-0 SH27 (SUTURE) IMPLANT
SUT VIC AB 0 CTX 36 (SUTURE) ×5
SUT VIC AB 0 CTX36XBRD ANBCTRL (SUTURE) ×5 IMPLANT
SUT VIC AB 4-0 KS 27 (SUTURE) ×1 IMPLANT
SYR KIT LINE DRAW 1CC W/FILTR (LINER) ×1 IMPLANT
TOWEL OR 17X24 6PK STRL BLUE (TOWEL DISPOSABLE) ×1 IMPLANT
TRAY FOLEY W/BAG SLVR 14FR LF (SET/KITS/TRAYS/PACK) ×1 IMPLANT
WATER STERILE IRR 1000ML POUR (IV SOLUTION) ×1 IMPLANT

## 2023-05-24 NOTE — Progress Notes (Signed)
Labor Progress Note  Dawn Hoffman is a 32 y.o. G3P0110 at [redacted]w[redacted]d presented for IOL cHTN  S: Pt feeling pressure  O:  BP (!) 159/66   Pulse (!) 104   Temp 98.3 F (36.8 C) (Oral)   Resp 15   Ht 5' (1.524 m)   Wt 100.9 kg   LMP 09/05/2022   SpO2 98%   BMI 43.43 kg/m  EFM:140 bpm/Moderate variability/ 15x15 accels/ None decels CAT: 1 Toco: regular, every 2-4 minutes   CVE: Dilation: 4.5 Effacement (%): 60 Cervical Position: Middle Station: -2 Presentation: Vertex Exam by:: [name]   A&P: 32 y.o. [id number] [redacted]w[redacted]d  here for IOL as above  #Labor: Progressing well. Cont pit 2x2, IUPC placed #Pain: Family/Friend support and Epidural #FWB: CAT 1 #GBS negative #cHTN SIPE with SF: P/C 0.3. Magnesium gtt.  IV hydralazine protocol. Cont PO labetolol 200 BID  [name], DO FMOB Fellow, Faculty practice [facility], Center for [name] 05/24/23  12:48 AM

## 2023-05-24 NOTE — Progress Notes (Signed)
Labor Progress Note  Dawn Hoffman is a 32 y.o. G3P0110 at [redacted]w[redacted]d presented for IOL cHTN  S: Pt on pitocin now for 24+ hours. She remains the same SVE, essentially. We discussed stopping pitocin or proceeding with C-section. MVUs on 36 pit as high as 130. Pt feeling more back pain.  O:  BP (!) 159/66   Pulse (!) 104   Temp 98.3 F (36.8 C) (Oral)   Resp 15   Ht 5' (1.524 m)   Wt 100.9 kg   LMP 09/05/2022   SpO2 98%   BMI 43.43 kg/m  EFM:140 bpm/Moderate variability/ 15x15 accels/ None decels CAT: 1 Toco: regular, every 2-4 minutes   CVE: Dilation: 4.5 Effacement (%): 60 Cervical Position: Middle Station: -2 Presentation: Vertex Exam by:: [name]   A&P: 33 y.o. [id number] [redacted]w[redacted]d  here for IOL as above  #Labor: stop pitocin. Restart ~11am. TUMS. Continue monitoring and maternal position changes. #Pain: Family/Friend support and Epidural #FWB: CAT 1 #GBS negative #cHTN SIPE with SF: P/C 0.3. Magnesium gtt.  IV hydralazine protocol. Cont PO labetolol 200 BID  [name], DO FMOB Fellow, Faculty practice [facility], Center for [facility] 05/24/23  6:57 AM

## 2023-05-24 NOTE — Progress Notes (Signed)
Request for IV team to insert second IV.  Patient's IV medications and fluids that are prescribed at this time are compatible.  Secure chat to Nelda Bucks, RN regarding this.  Educated regarding vein preservation and increasing risk for infection by starting more IVs to run compatible medications separately.  Advised to re consult if other issues arise with current IV site.

## 2023-05-24 NOTE — Discharge Summary (Signed)
Postpartum Discharge Summary     Patient Name: Dawn Hoffman DOB: [birth year] MRN: [id number]  Date of admission: 05/22/2023 Delivery date:05/24/2023 Delivering provider: Lesly Dukes Date of discharge: 05/27/2023  Admitting diagnosis: Encounter for induction of labor [Z34.90] Intrauterine pregnancy: [redacted]w[redacted]d     Secondary diagnosis:  Principal Problem:   Status post repeat low transverse cesarean section Active Problems:   Supervision of high risk pregnancy, antepartum   Chronic hypertension in obstetric context in third trimester   Previous cesarean delivery affecting pregnancy, antepartum   Pregnancy with poor obstetric history (neonatal demise at 20 weeks due to ICH)   Encounter for induction of labor   Severe preeclampsia, third trimester  Additional problems:     Discharge diagnosis: Term Pregnancy Delivered and CHTN with superimposed preeclampsia with severe features                                           Post partum procedures:   Augmentation: AROM, Pitocin, and Cytotec Complications: None  Hospital course: Induction of Labor With Cesarean Section   32 y.o. yo [id number] at [redacted]w[redacted]d was admitted to the hospital 05/22/2023 for induction of labor. Patient had a labor course significant for slow progress in cervical dilation. She also had a progression from cHTN to severe pre-eclampsia. The patient went for cesarean section due to Arrest of Dilation and failed TOLAC . She had her epidural inadvertently replaced by nursing staff during labor and due to risk of infection, had blood cultures collected and a 2 day course of vancomycin and ceftriaxone per ID recommendation to prevent MRSA bacteremia. Delivery details are as follows: Membrane Rupture Time/Date: 6:45 PM,05/23/2023  Delivery Method:C-Section, Low Transverse Details of operation can be found in separate operative Note.  Patient had a postpartum course complicated by. She is ambulating, tolerating a regular diet,  passing flatus, and urinating well.  Patient is discharged home in stable condition on 05/27/23.      Newborn Data: Birth date:05/24/2023 Birth time:11:00 AM Gender:Female Living status:Living Apgars:8 ,9  Weight:2710 g                               Magnesium Sulfate received: Yes: Seizure prophylaxis BMZ received: No Rhophylac:N/A MMR:N/A T-DaP:Given prenatally Flu: N/A Transfusion:No  Physical exam  Vitals:   05/27/23 0358 05/27/23 0818 05/27/23 0841 05/27/23 1140  BP: (!) 156/78 (!) 182/95 (!) 193/115 126/77  Pulse: 75 73 79 73  Resp: 18 18  18   Temp: 98 F (36.7 C) 98.3 F (36.8 C)  97.9 F (36.6 C)  TempSrc: Oral Oral  Oral  SpO2: 100% 100%  98%  Weight:      Height:       General: alert, cooperative, and no distress Lochia: appropriate Uterine Fundus: firm Incision: Healing well with no significant drainage DVT Evaluation: No evidence of DVT seen on physical exam. Labs: Lab Results  Component Value Date   WBC 16.4 (H) 05/25/2023   HGB 10.6 (L) 05/25/2023   HCT 32.8 (L) 05/25/2023   MCV 82.4 05/25/2023   PLT 217 05/25/2023      Latest Ref Rng & Units 05/25/2023    3:01 AM  CMP  Glucose 70 - 99 mg/dL 99   BUN 6 - 20 mg/dL 5   Creatinine 6.96 - 2.95 mg/dL 2.84  Sodium 135 - 145 mmol/L 132   Potassium 3.5 - 5.1 mmol/L 4.1   Chloride 98 - 111 mmol/L 103   CO2 22 - 32 mmol/L 20   Calcium 8.9 - 10.3 mg/dL 7.4   Total Protein 6.5 - 8.1 g/dL 5.5   Total Bilirubin 0.3 - 1.2 mg/dL 0.6   Alkaline Phos 38 - 126 U/L 50   AST 15 - 41 U/L 21   ALT 0 - 44 U/L 21    Edinburgh Score:    05/26/2023   10:44 AM  Edinburgh Postnatal Depression Scale Screening Tool  I have been able to laugh and see the funny side of things. 0  I have looked forward with enjoyment to things. 0  I have blamed myself unnecessarily when things went wrong. 0  I have been anxious or worried for no good reason. 0  I have felt scared or panicky for no good reason. 0  Things have been  getting on top of me. 1  I have been so unhappy that I have had difficulty sleeping. 0  I have felt sad or miserable. 0  I have been so unhappy that I have been crying. 0  The thought of harming myself has occurred to me. 0  Edinburgh Postnatal Depression Scale Total 1     After visit meds:  Allergies as of 05/27/2023   No Known Allergies      Medication List     STOP taking these medications    aspirin EC 81 MG tablet   ferrous sulfate 324 (65 Fe) MG Tbec   valACYclovir 500 MG tablet Commonly known as: VALTREX       TAKE these medications    Blood Pressure Kit Devi 1 Device by Does not apply route once a week.   docusate sodium 100 MG capsule Commonly known as: Colace Take 1 capsule (100 mg total) by mouth 2 (two) times daily.   furosemide 20 MG tablet Commonly known as: LASIX Take 1 tablet (20 mg total) by mouth daily for 2 days.   gabapentin 100 MG capsule Commonly known as: NEURONTIN Take 2 capsules (200 mg total) by mouth 2 (two) times daily for 7 days.   ibuprofen 600 MG tablet Commonly known as: ADVIL Take 1 tablet (600 mg total) by mouth every 6 (six) hours as needed.   labetalol 200 MG tablet Commonly known as: NORMODYNE Take 1 tablet (200 mg total) by mouth 2 (two) times daily. What changed: Another medication with the same name was added. Make sure you understand how and when to take each.   labetalol 200 MG tablet Commonly known as: NORMODYNE Take 0.5 tablets (100 mg total) by mouth 2 (two) times daily. What changed: You were already taking a medication with the same name, and this prescription was added. Make sure you understand how and when to take each.   NIFEdipine 30 MG 24 hr tablet Commonly known as: ADALAT CC Take 1 tablet (30 mg total) by mouth daily.   oxyCODONE-acetaminophen 5-325 MG tablet Commonly known as: PERCOCET/ROXICET Take 1-2 tablets by mouth every 6 (six) hours as needed.   PRENATAL VITAMIN PO Take 1 tablet by mouth  daily.   simethicone 80 MG chewable tablet Commonly known as: MYLICON Chew 1 tablet (80 mg total) by mouth as needed for flatulence.         Discharge home in stable condition Infant Feeding: Breast Infant Disposition:home with mother Discharge instruction: per After Visit Summary and Postpartum booklet.  Activity: Advance as tolerated. Pelvic rest for 6 weeks.  Diet: routine diet Future Appointments: Future Appointments  Date Time Provider Department Center  05/31/2023 10:00 AM [facility] NURSE [facility] None  07/09/2023  3:50 PM Lennart Pall, MD [facility] None    Follow up Visit:  Follow-up Information     [facility]. Go on 05/31/2023.   Why: call the office tomorrow if you have not heard about this appointment to check your blood pressure and your incision Contact information: [address] [place] [address] [phone]                The following message was sent to [facility].  Please schedule this patient for a In person postpartum visit in 6 weeks with the following provider: MD. Additional Postpartum F/U:Incision check 1 week and BP check 1 week  High risk pregnancy complicated by:  cHTN with superimposed pre-eclampsia with severe features Delivery mode:  C-Section, Low Transverse Anticipated Birth Control:  Unsure   05/27/2023 Lazaro Arms, MD

## 2023-05-24 NOTE — Op Note (Signed)
Dawn Hoffman PROCEDURE DATE: 05/24/2023  PREOPERATIVE DIAGNOSIS: Intrauterine pregnancy at  [redacted]w[redacted]d weeks gestation, chronic hypertension with superimposed pre-eclampsia with severe features. Arrest of dilation.  POSTOPERATIVE DIAGNOSIS: The same  PROCEDURE:   Repeat Low Transverse Cesarean Section  SURGEON:  Dr. Elsie Lincoln  ASSISTANT: Sheppard Evens MD  An experienced assistant was required given the standard of surgical care given the complexity of the case.  This assistant was needed for exposure, dissection, suctioning, retraction, instrument exchange, assisting with delivery with administration of fundal pressure, and for overall help during the procedure.  INDICATIONS: Dawn Hoffman is a 32 y.o. [id number] at [redacted]w[redacted]d.  The risks of cesarean section discussed with the patient included but were not limited to: bleeding which may require transfusion or reoperation; infection which may require antibiotics; injury to bowel, bladder, ureters or other surrounding organs; injury to the fetus; need for additional procedures including hysterectomy in the event of a life-threatening hemorrhage; placental abnormalities wth subsequent pregnancies, incisional problems, thromboembolic phenomenon and other postoperative/anesthesia complications. The patient concurred with the proposed plan, giving informed written consent for the procedure.    FINDINGS:  Viable female  infant in cephalic presentation, Apgars APGAR (1 MIN): 8  APGAR (5 MINS): 9, weight 2710g, amniotic fluid.  Intact placenta, three vessel cord.  Grossly normal uterus, ovaries and fallopian tubes. .   ANESTHESIA:    Epidural  ESTIMATED BLOOD LOSS: 181cc  SPECIMENS: Placenta sent to pathology  COMPLICATIONS: None immediate  PROCEDURE IN DETAIL:  The patient received intravenous antibiotics and had sequential compression devices applied to her lower extremities. Epidural anesthesia was dosed up to surgical level and was  found to be adequate. She was then placed in a dorsal supine position with a leftward tilt, and prepped and draped in a sterile manner.  A foley catheter was placed into her bladder and attached to constant gravity.  After an adequate timeout was performed, a Pfannenstiel skin incision was made with scalpel and carried through to the underlying layer of fascia. The fascia was incised in the midline and this incision was extended bilaterally using the Mayo scissors. Kocher clamps were applied to the superior aspect of the fascial incision and the underlying rectus muscles were dissected off bluntly. A similar process was carried out on the inferior aspect of the facial incision. The rectus muscles were separated in the midline bluntly and the peritoneum was entered bluntly. An alexis retractor was placed in the abdomen to aid visualization. A transverse hysterotomy was made with a scalpel and extended bilaterally bluntly. The infant was successfully delivered, and cord was clamped and cut after 1 minute and infant was handed over to awaiting neonatology team. Uterine massage was then administered and the placenta delivered intact with three-vessel cord. The uterus was cleared of clot and debris.  The hysterotomy was closed with 0 vicryl. A figure-of-8 stitch was placed on the hysterotomy site to ensure hemostasis. The peritoneum and rectus muscles were noted to be hemostatic.  The peritoneum was re-approximated using 0 vicryl. The fascia was closed with 0-Vicryl in a running fashion with good restoration of anatomy.  The subcutaneus tissue was copiously irrigated and reapproximated using 2-0 plain gut.  The skin was closed with 4-0 Vicryl in a subcuticular fashion.    Pt tolerated the procedure will.  All counts were correct x2.  Pt went to the recovery room in stable condition.  Sheppard Evens MD MPH OB Fellow, Faculty Practice [facility], Center for [facility] 05/24/2023

## 2023-05-24 NOTE — Progress Notes (Signed)
Labor Progress Note Dawn Hoffman is a 32 y.o. G3P0110 at [redacted]w[redacted]d presented for IOL TOLAC due to cHTN and developed pre-eclampsia with SF while on admission S: patient seen this morning. Has had no cervical change for >12 hours at this point, despite being on pitocin up to 36 units. Required IV meds to treat severe range blood pressures X 3 over night. She has been on a pitocin break since 7am this morning.  Discussed current situation with patient at bedside along with Dr Penne Lash. We discussed the risk of prolonging labor, in the context of pre-eclampsia with SF, on Magnesium, while continuing to try for a vaginal delivery, vs deciding for a repeat C-section at this point, given arrest of dilation. We also discussed the risk of intrapartum infection.  She thought about it for some time and decided to go ahead with a C-section for arrest of dilation and pre-eclampsia with SF.  O:  BP (!) 142/88   Pulse 100   Temp 97.6 F (36.4 C) (Oral)   Resp 16   Ht 5' (1.524 m)   Wt 100.9 kg   LMP 09/05/2022   SpO2 98%   BMI 43.43 kg/m   EFM: 130bpm /moderate /+accels no decels  CVE: Dilation: 5 Effacement (%): 70, 60 Cervical Position: Middle Station: -2 Presentation: Vertex Exam by:: Derrill Kay   A&P: 32 y.o. G3P0110 [redacted]w[redacted]d IOL for cHTN with SIPE with SF. #Labor: slow progress, with no cervical change for >12 hours. Plan to proceed with repeat LTCS for Arrest of dilation and pre-eclampsia with severe features.   The risks of cesarean section discussed with the patient included but were not limited to: bleeding which may require transfusion or reoperation; infection which may require antibiotics; injury to bowel, bladder, ureters or other surrounding organs; injury to the fetus; need for additional procedures including hysterectomy in the event of a life-threatening hemorrhage; placental abnormalities wth subsequent pregnancies, incisional problems, thromboembolic phenomenon and  other postoperative/anesthesia complications. The patient concurred with the proposed plan, giving informed written consent for the procedure. Anesthesia and OR aware. Preoperative prophylactic antibiotics and SCDs ordered on call to the OR. To OR when ready.   Patient has a high risk of bleeding, due to pre-eclampsia with SF on Magnesium for seizure ppx, and long labor process. She also has a history of asthma, mild intermittent, with no frequent inhaler use. Hence both methergine and Hemabate and contraindicated. We will plan for   - IV TXA 30 mins before the procedure start - Stop magnesium sulfate at the beginning of the C-section - Give misoprostol buccal after delivery of the baby.  Sheppard Evens MD MPH OB Fellow, Faculty Practice [facility], Center for [facility] 05/24/2023

## 2023-05-24 NOTE — Plan of Care (Signed)
Problem: Education: Goal: Knowledge of General Education information will improve Description: Including pain rating scale, medication(s)/side effects and non-pharmacologic comfort measures Outcome: Progressing   Problem: Health Behavior/Discharge Planning: Goal: Ability to manage health-related needs will improve Outcome: Progressing   Problem: Clinical Measurements: Goal: Ability to maintain clinical measurements within normal limits will improve Outcome: Progressing Goal: Will remain free from infection Outcome: Progressing Goal: Diagnostic test results will improve Outcome: Progressing Goal: Respiratory complications will improve Outcome: Progressing Goal: Cardiovascular complication will be avoided Outcome: Progressing   Problem: Activity: Goal: Risk for activity intolerance will decrease Outcome: Progressing   Problem: Nutrition: Goal: Adequate nutrition will be maintained Outcome: Progressing   Problem: Coping: Goal: Level of anxiety will decrease Outcome: Progressing   Problem: Elimination: Goal: Will not experience complications related to bowel motility Outcome: Progressing Goal: Will not experience complications related to urinary retention Outcome: Progressing   Problem: Pain Managment: Goal: General experience of comfort will improve Outcome: Progressing   Problem: Safety: Goal: Ability to remain free from injury will improve Outcome: Progressing   Problem: Skin Integrity: Goal: Risk for impaired skin integrity will decrease Outcome: Progressing   Problem: Education: Goal: Knowledge of Childbirth will improve Outcome: Progressing Goal: Ability to make informed decisions regarding treatment and plan of care will improve Outcome: Progressing Goal: Ability to state and carry out methods to decrease the pain will improve Outcome: Progressing Goal: Individualized Educational Video(s) Outcome: Progressing   Problem: Coping: Goal: Ability to  verbalize concerns and feelings about labor and delivery will improve Outcome: Progressing   Problem: Life Cycle: Goal: Ability to make normal progression through stages of labor will improve Outcome: Progressing Goal: Ability to effectively push during vaginal delivery will improve Outcome: Progressing   Problem: Role Relationship: Goal: Will demonstrate positive interactions with the child Outcome: Progressing   Problem: Safety: Goal: Risk of complications during labor and delivery will decrease Outcome: Progressing   Problem: Pain Management: Goal: Relief or control of pain from uterine contractions will improve Outcome: Progressing   Problem: Education: Goal: Knowledge of disease or condition will improve Outcome: Progressing Goal: Knowledge of the prescribed therapeutic regimen will improve Outcome: Progressing   Problem: Fluid Volume: Goal: Peripheral tissue perfusion will improve Outcome: Progressing   Problem: Clinical Measurements: Goal: Complications related to disease process, condition or treatment will be avoided or minimized Outcome: Progressing   Problem: Education: Goal: Knowledge of the prescribed therapeutic regimen will improve Outcome: Progressing Goal: Understanding of sexual limitations or changes related to disease process or condition will improve Outcome: Progressing Goal: Individualized Educational Video(s) Outcome: Progressing   Problem: Self-Concept: Goal: Communication of feelings regarding changes in body function or appearance will improve Outcome: Progressing   Problem: Skin Integrity: Goal: Demonstration of wound healing without infection will improve Outcome: Progressing   Problem: Education: Goal: Knowledge of condition will improve Outcome: Progressing Goal: Individualized Educational Video(s) Outcome: Progressing Goal: Individualized Newborn Educational Video(s) Outcome: Progressing   Problem: Activity: Goal: Will  verbalize the importance of balancing activity with adequate rest periods Outcome: Progressing Goal: Ability to tolerate increased activity will improve Outcome: Progressing   Problem: Coping: Goal: Ability to identify and utilize available resources and services will improve Outcome: Progressing   Problem: Life Cycle: Goal: Chance of risk for complications during the postpartum period will decrease Outcome: Progressing   Problem: Role Relationship: Goal: Ability to demonstrate positive interaction with newborn will improve Outcome: Progressing   Problem: Skin Integrity: Goal: Demonstration of wound healing without infection will improve Outcome:  Progressing

## 2023-05-24 NOTE — Lactation Note (Signed)
This note was copied from a baby's chart. Lactation Consultation Note  Patient Name: Dawn Hoffman WUJWJ'X Date: 05/24/2023 Age:34 hours Reason for consult: Initial assessment;1st time breastfeeding;Infant < 6lbs (Per Support Person had video that infant weight 6 lbs at birth and not 5 lbs 15.6 ounces. LC informed RN of what Support Person stated. See Birth Parent' MR: C/S, CHTN and Asthma.)  Per Birth Parent she wants to focus on breastfeeding does not want supplement infant at this time, will inform RN if she decides to supplement infant. Infant latched on Birth Parent's left breast using the football hold position, infant was on and off the breast initially but started sustaining her latch and BF for 14 minutes. LC assisted with positioning and alignment of infant breast to chest with Birth Parent. LC discussed infant's input and out put and LC changed void diaper while in room. LC discussed the importance of maternal rest, diet and hydration. Birth Parent was made aware of O/P services, breastfeeding support groups, community resources, and our phone # for post-discharge questions.    Current feeding plan: 1- Birth Parent will breastfeed infant according to hunger cues, on demand, 8+ times within 24 hours, skin to skin. 2- Birth Parent will continue to use DEBP every 3 hours for 15 minutes on initial setting and will give infant back any EBM that is pumped by spoon. 3- Birth Parent knows to call RN/LC for further latch assistance if needed.  Maternal Data Has patient been taught Hand Expression?: Yes Does the patient have breastfeeding experience prior to this delivery?: No  Feeding Mother's Current Feeding Choice: Breast Milk  LATCH Score Latch: Repeated attempts needed to sustain latch, nipple held in mouth throughout feeding, stimulation needed to elicit sucking reflex.  Audible Swallowing: A few with stimulation  Type of Nipple: Everted at rest and after  stimulation  Comfort (Breast/Nipple): Soft / non-tender  Hold (Positioning): Assistance needed to correctly position infant at breast and maintain latch.  LATCH Score: 7   Lactation Tools Discussed/Used Tools: Pump;Flanges Flange Size: 24 Breast pump type: Double-Electric Breast Pump Pump Education: Setup, frequency, and cleaning Reason for Pumping: DEBP kit in room but not set up, due infant being less than 6 lbs, C/S delivery and colostrum not present with hand expression. Pumping frequency: Birth Parent will continue to use DEBP every 3 hours for 15 minutes on inital setting.  Interventions Interventions: Assisted with latch;Support pillows;Breast feeding basics reviewed;Adjust position;Skin to skin;Position options;Education;LC Services brochure;Breast compression;Hand express;DEBP  Discharge Pump: DEBP;Personal  Consult Status Consult Status: Follow-up Date: 05/25/23 Follow-up type: In-patient    Frederico Hamman 05/24/2023, 8:04 PM

## 2023-05-24 NOTE — Anesthesia Postprocedure Evaluation (Signed)
Anesthesia Post Note  Patient: Dawn Hoffman  Procedure(s) Performed: CESAREAN SECTION     Patient location during evaluation: PACU Anesthesia Type: Epidural Level of consciousness: awake and alert and oriented Pain management: pain level controlled Vital Signs Assessment: post-procedure vital signs reviewed and stable Respiratory status: spontaneous breathing, nonlabored ventilation and respiratory function stable Cardiovascular status: blood pressure returned to baseline and stable Postop Assessment: no headache, no backache, spinal receding and patient able to bend at knees Anesthetic complications: no   No notable events documented.  Last Vitals:  Vitals:   05/24/23 1513 05/24/23 1535  BP: (!) 160/83 (!) 173/99  Pulse: 100 (!) 115  Resp: (!) 26 (!) 24  Temp: 37.3 C   SpO2: 98%     Last Pain:  Vitals:   05/24/23 1513  TempSrc: Axillary  PainSc:    Pain Goal:                   [name]

## 2023-05-24 NOTE — Progress Notes (Signed)
Pharmacy Antibiotic Note  Dawn Hoffman is a 32 y.o. female admitted on 05/22/2023 for IOL at 37weeks due to [facility].  Pharmacy has been consulted for Vancomycin dosing for epidural abscess prophylaxis due to non-sterile reconnection of epidural catheter. ID recommends 48 hours of antibiotic prophylaxis  Plan: Pt received Vancomycin 2000mg  IV x 1 in OR then Vancomycin 1000mg  IV q8h based on AUC dosing. Since duration of therapy limited to 48 hours, will not need to obtain trough level.   Height: 5' (152.4 cm) Weight: 100.9 kg (222 lb 6.4 oz) IBW/kg (Calculated) : 45.5  Temp (24hrs), Avg:98.5 F (36.9 C), Min:97.6 F (36.4 C), Max:99.6 F (37.6 C)  Recent Labs  Lab 05/22/23 1650 05/23/23 1050 05/23/23 2249 05/24/23 0945  WBC 11.6* 11.6*  --  21.1*  CREATININE 0.51  --  0.51 0.56    Estimated Creatinine Clearance: 105.9 mL/min (by C-G formula based on SCr of 0.56 mg/dL).    No Known Allergies  Antimicrobials this admission: Ceftriaxone 2 grams IV q24h 7/19 x 48 hours    Microbiology results: 7/19 BCx: pending  Thank you for allowing pharmacy to be a part of this patient's care.  Claybon Jabs 05/24/2023 6:57 PM

## 2023-05-24 NOTE — Transfer of Care (Signed)
Immediate Anesthesia Transfer of Care Note  Patient: Dawn Hoffman  Procedure(s) Performed: CESAREAN SECTION  Patient Location: PACU  Anesthesia Type:Epidural  Level of Consciousness: awake, alert , and oriented  Airway & Oxygen Therapy: Patient Spontanous Breathing  Post-op Assessment: Report given to RN and Post -op Vital signs reviewed and stable  Post vital signs: Reviewed and stable  Last Vitals:  Vitals Value Taken Time  BP 159/95 05/24/23 1200  Temp    Pulse 104 05/24/23 1204  Resp 25 05/24/23 1204  SpO2 100 % 05/24/23 1204  Vitals shown include unfiled device data.  Last Pain:  Vitals:   05/24/23 1000  TempSrc: Oral  PainSc: 0-No pain         Complications: No notable events documented.

## 2023-05-24 NOTE — Progress Notes (Signed)
Dr. Paulla Fore present at bedside when epidural catheter was removed.

## 2023-05-24 NOTE — Progress Notes (Signed)
MOB was referred for history of depression/anxiety. * Referral screened out by Clinical Social Worker because none of the following criteria appear to apply: ~ History of anxiety/depression during this pregnancy, or of post-partum depression following prior delivery. ~ Diagnosis of anxiety and/or depression within last 3 years. No concerns were noted in MOB's OB records.  OR * MOB's symptoms currently being treated with medication and/or therapy. Please contact the Clinical Social Worker if needs arise, by MOB request, or if MOB scores greater than 9/yes to question 10 on Edinburgh Postpartum Depression Screen.   Angel Boyd-Gilyard, MSW, LCSW Clinical Social Work [phone]  

## 2023-05-24 NOTE — Progress Notes (Signed)
Called for epidural disconnection from alligator clamp 6:50am. When I arrived to room, RN had already reconnected contaminated epidural catheter to alligator clamp. Discussed w/ patient and family that we recommend removing and replacing epidural as it is no longer sterile and she is at risk of epidural abscess. Pt and family adamant that they do not want epidural replaced, as it was already a difficult placement yesterday. I cut and reconnected epidural catheter in sterile fashion w/ new alligator clamp after sterilizing catheter with iodine. Discussed with ID Dr. Chipper Herb who recommended replacing catheter, drawing blood cultures and starting 48h of vancomycin and ceftriaxone empirically. I discussed all of this would pt and family, all questions answered.

## 2023-05-25 LAB — COMPREHENSIVE METABOLIC PANEL
ALT: 21 U/L (ref 0–44)
AST: 21 U/L (ref 15–41)
Albumin: 2.4 g/dL — ABNORMAL LOW (ref 3.5–5.0)
Alkaline Phosphatase: 50 U/L (ref 38–126)
Anion gap: 9 (ref 5–15)
BUN: 5 mg/dL — ABNORMAL LOW (ref 6–20)
CO2: 20 mmol/L — ABNORMAL LOW (ref 22–32)
Calcium: 7.4 mg/dL — ABNORMAL LOW (ref 8.9–10.3)
Chloride: 103 mmol/L (ref 98–111)
Creatinine, Ser: 0.59 mg/dL (ref 0.44–1.00)
GFR, Estimated: >60 mL/min (ref >60)
Glucose, Bld: 99 mg/dL (ref 70–99)
Potassium: 4.1 mmol/L (ref 3.5–5.1)
Sodium: 132 mmol/L — ABNORMAL LOW (ref 135–145)
Total Bilirubin: 0.6 mg/dL (ref 0.3–1.2)
Total Protein: 5.5 g/dL — ABNORMAL LOW (ref 6.5–8.1)

## 2023-05-25 LAB — CBC
HCT: 32.8 % — ABNORMAL LOW (ref 36.0–46.0)
Hemoglobin: 10.6 g/dL — ABNORMAL LOW (ref 12.0–15.0)
MCH: 26.6 pg (ref 26.0–34.0)
MCHC: 32.3 g/dL (ref 30.0–36.0)
MCV: 82.4 fL (ref 80.0–100.0)
Platelets: 217 10*3/uL (ref 150–400)
RBC: 3.98 MIL/uL (ref 3.87–5.11)
RDW: 14.2 % (ref 11.5–15.5)
WBC: 16.4 10*3/uL — ABNORMAL HIGH (ref 4.0–10.5)
nRBC: 0 % (ref 0.0–0.2)

## 2023-05-25 LAB — CULTURE, BLOOD (ROUTINE X 2): Culture: NO GROWTH

## 2023-05-25 LAB — URINE CULTURE
Culture: NO GROWTH
Special Requests: NORMAL

## 2023-05-25 NOTE — Progress Notes (Signed)
OB/GYN Faculty Attending Note  Post Op Day 1  Subjective: Patient is feeling well. She reports moderately well controlled pain on PO pain meds. She is ambulating and denies light-headedness or dizziness. She is voiding spontaneously. She is passing flatus, has not had a BM yet. She is tolerating a regular diet without nausea/vomiting. Bleeding is moderate. She is breast feeding. Baby is in room and doing well.  Objective: Blood pressure 106/67, pulse 90, temperature 98.1 F (36.7 C), temperature source Oral, resp. rate 18, height 5' (1.524 m), weight 100.9 kg, last menstrual period 09/05/2022, SpO2 99%, unknown if currently breastfeeding. Temp:  [97.8 F (36.6 C)-98.3 F (36.8 C)] 98.1 F (36.7 C) (07/20 1331) Pulse Rate:  [77-115] 90 (07/20 1331) Resp:  [17-24] 18 (07/20 1400) BP: (106-173)/(55-100) 106/67 (07/20 1331) SpO2:  [95 %-99 %] 99 % (07/20 1331)  Physical Exam:  General: alert, oriented, cooperative Chest: normal respiratory effort Heart: RRR  Abdomen: soft, appropriately tender to palpation, incision covered by dressing with no evidence of active bleeding  Uterine Fundus: firm, at level of the umbilicus Lochia: moderate, rubra DVT Evaluation: no evidence of DVT Extremities: no edema, no calf tenderness  UOP: voiding spontaneously  Recent Labs    05/24/23 0945 05/25/23 0301  HGB 12.0 10.6*  HCT 36.6 32.8*    Assessment/Plan: Patient Active Problem List   Diagnosis Date Noted   Status post repeat low transverse cesarean section 05/24/2023   Severe preeclampsia, third trimester 05/24/2023   Encounter for induction of labor 05/22/2023   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 [id number] POD#1 s/p RCS at [redacted]w[redacted]d for failed TOLAC d/t arrest of dilation. Course complicated by cHTN with superimposed severe pre-eclampsia, s/p mag on procardia and lasix. She is doing very well, recovering appropriately and complains only of moderately well controlled pain.   Continue routine post partum care S/p mag Cont procardia/lasix Pain meds prn Regular diet Lovenox 40 mg daily [place] undecided for birth control   K. Therese Sarah, MD, [facility] Attending Center for [employer] (Faculty Practice)  05/25/2023, 3:31 PM

## 2023-05-25 NOTE — Lactation Note (Signed)
This note was copied from a baby's chart. Lactation Consultation Note  Patient Name: Dawn Hoffman [id number] Date: 05/25/2023 Age:34 hours Reason for consult: Follow-up assessment;Primapara;1st time breastfeeding;Early term 37-38.6wks;Infant < 6lbs;Infant weight loss;Breastfeeding assistance;Other (Comment) (GHTN on MgSO4)  LC in to visit with P2 Mom of ET infant weighing just under 6 lbs.  Baby is at a 4.6% weight loss and having 1 stool and 4 voids so far.  Baby sleeping swaddled in crib currently.  Since it had been 2 1/2 hrs since last feeding, LC suggested with remove baby's swaddle and place her STS to encourage baby to latch and feed.  Baby woke and started to fuss.  Placed STS on Mom's chest, noted some mouth movements.  LC demonstrated hand expression, unable to express an colostrum at this time.    Placed baby STS in football hold on left breast.  Mom with larger, heavy breasts.  Baby did not cue and did not open her mouth to latch.  Since Mom had not pumped this am, FOB took baby STS in recliner and LC assisted with pumping, 21 mm flanges sized.    Mom encouraged to pump after every latch or latch attempt, or with each supplement until baby is feeding consistently. RN aware of need to pace bottle feed, reviewed this with parents.  LATCH Score Latch: Too sleepy or reluctant, no latch achieved, no sucking elicited.  Audible Swallowing: None  Type of Nipple: Everted at rest and after stimulation (short nipple shafts, compressible areola)  Comfort (Breast/Nipple): Soft / non-tender  Hold (Positioning): Full assist, staff holds infant at breast  LATCH Score: 4   Lactation Tools Discussed/Used Tools: Pump;Flanges;Bottle Flange Size: 21 Breast pump type: Double-Electric Breast Pump Pump Education: Setup, frequency, and cleaning;Milk Storage Reason for Pumping: Support milk supply Pumping frequency: Mom has pumped 3 times so far, encouraged to increase to after every  breastfeeding or attempt at breast Pumped volume: 0 mL  Interventions Interventions: Breast feeding basics reviewed;Assisted with latch;Skin to skin;Breast massage;Hand express;Adjust position;Support pillows;Position options;DEBP;Education;Pace feeding  Discharge Pump: Personal;DEBP (unsure of brand, not through insurance, STORK pump requested)  Consult Status Consult Status: Follow-up Date: 05/26/23 Follow-up type: In-patient    [name] 05/25/2023, 10:09 AM

## 2023-05-26 LAB — BPAM RBC
Blood Product Expiration Date: 202408172359
Unit Type and Rh: 7300
Unit Type and Rh: 7300

## 2023-05-26 LAB — TYPE AND SCREEN: Antibody Screen: NEGATIVE

## 2023-05-26 NOTE — Plan of Care (Signed)
Problem: Education: Goal: Knowledge of General Education information will improve Description: Including pain rating scale, medication(s)/side effects and non-pharmacologic comfort measures Outcome: Progressing   Problem: Health Behavior/Discharge Planning: Goal: Ability to manage health-related needs will improve Outcome: Progressing   Problem: Clinical Measurements: Goal: Ability to maintain clinical measurements within normal limits will improve Outcome: Progressing Goal: Will remain free from infection Outcome: Progressing Goal: Diagnostic test results will improve Outcome: Progressing Goal: Respiratory complications will improve Outcome: Progressing Goal: Cardiovascular complication will be avoided Outcome: Progressing   Problem: Activity: Goal: Risk for activity intolerance will decrease Outcome: Progressing   Problem: Nutrition: Goal: Adequate nutrition will be maintained Outcome: Progressing   Problem: Coping: Goal: Level of anxiety will decrease Outcome: Progressing   Problem: Elimination: Goal: Will not experience complications related to bowel motility Outcome: Progressing Goal: Will not experience complications related to urinary retention Outcome: Progressing   Problem: Pain Managment: Goal: General experience of comfort will improve Outcome: Progressing   Problem: Safety: Goal: Ability to remain free from injury will improve Outcome: Progressing   Problem: Skin Integrity: Goal: Risk for impaired skin integrity will decrease Outcome: Progressing   Problem: Education: Goal: Knowledge of Childbirth will improve Outcome: Progressing Goal: Ability to make informed decisions regarding treatment and plan of care will improve Outcome: Progressing Goal: Ability to state and carry out methods to decrease the pain will improve Outcome: Progressing Goal: Individualized Educational Video(s) Outcome: Progressing   Problem: Coping: Goal: Ability to  verbalize concerns and feelings about labor and delivery will improve Outcome: Progressing   Problem: Life Cycle: Goal: Ability to make normal progression through stages of labor will improve Outcome: Progressing Goal: Ability to effectively push during vaginal delivery will improve Outcome: Progressing   Problem: Role Relationship: Goal: Will demonstrate positive interactions with the child Outcome: Progressing   Problem: Safety: Goal: Risk of complications during labor and delivery will decrease Outcome: Progressing   Problem: Pain Management: Goal: Relief or control of pain from uterine contractions will improve Outcome: Progressing   Problem: Education: Goal: Knowledge of disease or condition will improve Outcome: Progressing Goal: Knowledge of the prescribed therapeutic regimen will improve Outcome: Progressing   Problem: Fluid Volume: Goal: Peripheral tissue perfusion will improve Outcome: Progressing   Problem: Clinical Measurements: Goal: Complications related to disease process, condition or treatment will be avoided or minimized Outcome: Progressing   Problem: Education: Goal: Knowledge of the prescribed therapeutic regimen will improve Outcome: Progressing Goal: Understanding of sexual limitations or changes related to disease process or condition will improve Outcome: Progressing Goal: Individualized Educational Video(s) Outcome: Progressing   Problem: Self-Concept: Goal: Communication of feelings regarding changes in body function or appearance will improve Outcome: Progressing   Problem: Skin Integrity: Goal: Demonstration of wound healing without infection will improve Outcome: Progressing   Problem: Education: Goal: Knowledge of condition will improve Outcome: Progressing Goal: Individualized Educational Video(s) Outcome: Progressing Goal: Individualized Newborn Educational Video(s) Outcome: Progressing   Problem: Activity: Goal: Will  verbalize the importance of balancing activity with adequate rest periods Outcome: Progressing Goal: Ability to tolerate increased activity will improve Outcome: Progressing   Problem: Coping: Goal: Ability to identify and utilize available resources and services will improve Outcome: Progressing   Problem: Life Cycle: Goal: Chance of risk for complications during the postpartum period will decrease Outcome: Progressing   Problem: Role Relationship: Goal: Ability to demonstrate positive interaction with newborn will improve Outcome: Progressing   Problem: Skin Integrity: Goal: Demonstration of wound healing without infection will improve Outcome:  Progressing

## 2023-05-26 NOTE — Lactation Note (Signed)
This note was copied from a baby's chart. Lactation Consultation Note  Patient Name: Dawn Hoffman WUJWJ'X Date: 05/26/2023 Age:34 hours Reason for consult: Follow-up assessment;Primapara;1st time breastfeeding;Early term 37-38.6wks  LC in to visit with P2 (neonatal loss) Mom and ET infant.  Infant is at a 6% weight loss today.  RN shared her concern that baby has been tired and not feeding well.    NP in to examine baby.  LC offered to assist with positioning and trying to latch baby to the breast.  LC assisted Mom in supporting her breast in a U hold and supporting baby's head from ear to ear.  Baby opened her mouth widely, helped her latch, sucked 2 times and stopped.  This happened a few times when LC initiated a 20 mm nipple shield (showing Mom how to properly place)  Baby latched with wide open mouth, but wouldn't suckle.   On finger, baby sucked and tongue cupped under finger well.   NP asked LC to bottle feed and report back to her how baby does.  LC tried with the Nfant extra slow flow nipple and paced feeding with baby upright position.  Baby sucking, but not transferring any milk.  Switched to yellow slow flow nipple and side lying and baby able to pace herself well with the faster flow.  LC did educated parents during the feeding.  Baby sucked/swallowed without any stress signs, but when she paused her sucking, LC noted baby taking some time to catch her breath.  Reminded parents to watch baby for signs of stress and to only lift bottle up to continue feeding when baby starts to suckle comfortably.    Baby took 18 ml over 15 mins.  Mom eating her breakfast now and will do her pumping when she is done. RN notified to nipple used and side lying position to feed.  Parents aware of volume parameters and goals of feeding baby today.  LATCH Score Latch: Repeated attempts needed to sustain latch, nipple held in mouth throughout feeding, stimulation needed to elicit sucking  reflex.  Audible Swallowing: None  Type of Nipple: Everted at rest and after stimulation  Comfort (Breast/Nipple): Soft / non-tender  Hold (Positioning): Full assist, staff holds infant at breast  LATCH Score: 5   Lactation Tools Discussed/Used Tools: Pump;Flanges;Bottle;Nipple Shields Nipple shield size: 20 Flange Size: 21 Breast pump type: Double-Electric Breast Pump Pump Education: Setup, frequency, and cleaning;Milk Storage Reason for Pumping: Support milk supply Pumping frequency: every 3 hrs Pumped volume: 0 mL (drops)  Interventions Interventions: Breast feeding basics reviewed;Assisted with latch;Skin to skin;Breast massage;Hand express;Breast compression;Adjust position;Support pillows;Position options;DEBP;Education;Pace feeding  Discharge WIC Program: Yes  Consult Status Consult Status: Follow-up Date: 05/27/23 Follow-up type: In-patient    [name] 05/26/2023, 10:19 AM

## 2023-05-26 NOTE — Progress Notes (Signed)
POSTPARTUM PROGRESS NOTE  POD #2  Subjective:  Dawn Hoffman is a 32 y.o. [id number] s/p repeat LTCS at 101w2d.  She reports she doing well. No acute events overnight.  She denies any problems with ambulating, voiding or po intake. Denies nausea or vomiting. She has  passed flatus. Pain is well controlled.  Lochia is normal.  Objective: Blood pressure 132/61, pulse 69, temperature 98 F (36.7 C), temperature source Oral, resp. rate 16, height 5' (1.524 m), weight 100.9 kg, last menstrual period 09/05/2022, SpO2 98%, unknown if currently breastfeeding.  Physical Exam:  General: alert, cooperative and no distress Chest: no respiratory distress, clear to auscultation bilaterally Heart:regular rate and rhythm Abdomen: soft, nontender, nondistended, obese, positive bowel sounds Uterine Fundus: firm, appropriately tender DVT Evaluation: No calf swelling or tenderness Extremities: trace edema Skin: warm, dry; incision clean/dry/intact w/ honeycomb dressing in place  Recent Labs    05/24/23 0945 05/25/23 0301  HGB 12.0 10.6*  HCT 36.6 32.8*    Assessment/Plan: Dawn Hoffman is a 33 y.o. [id number] s/p repeat LTCS at 103w2d for failed TOLAC, arrest of dilation.  POD#2 -  Contraception: unsure Feeding: breast, lactation in room now, baby with poor latch Rocephin and Vanc course has been completed Dispo: Plan for discharge on 05/27/23.   LOS: 4 days   Mariel Aloe, [place] Faculty Attending, Center for [employer] 05/26/2023, 9:27 AM

## 2023-05-27 ENCOUNTER — Other Ambulatory Visit (HOSPITAL_COMMUNITY): Payer: Self-pay

## 2023-05-27 LAB — CULTURE, BLOOD (ROUTINE X 2): Culture: NO GROWTH

## 2023-05-27 MED ORDER — IBUPROFEN 600 MG PO TABS
600.0000 mg | ORAL_TABLET | Freq: Four times a day (QID) | ORAL | 0 refills | Status: DC | PRN
  Filled 2023-05-27: qty 30, 8d supply, fill #0

## 2023-05-27 MED ORDER — NIFEDIPINE ER 30 MG PO TB24
30.0000 mg | ORAL_TABLET | Freq: Every day | ORAL | 0 refills | Status: DC
  Filled 2023-05-27: qty 42, 42d supply, fill #0

## 2023-05-27 MED ORDER — FUROSEMIDE 20 MG PO TABS
20.0000 mg | ORAL_TABLET | Freq: Every day | ORAL | 0 refills | Status: DC
  Filled 2023-05-27: qty 2, 2d supply, fill #0

## 2023-05-27 MED ORDER — SIMETHICONE 80 MG PO CHEW
80.0000 mg | CHEWABLE_TABLET | ORAL | 0 refills | Status: DC | PRN
  Filled 2023-05-27: qty 100, 30d supply, fill #0

## 2023-05-27 MED ORDER — OXYCODONE-ACETAMINOPHEN 5-325 MG PO TABS
1.0000 | ORAL_TABLET | Freq: Four times a day (QID) | ORAL | 0 refills | Status: DC | PRN
  Filled 2023-05-27: qty 30, 4d supply, fill #0

## 2023-05-27 MED ORDER — LABETALOL HCL 200 MG PO TABS
200.0000 mg | ORAL_TABLET | Freq: Two times a day (BID) | ORAL | Status: DC
  Administered 2023-05-27: 200 mg via ORAL
  Filled 2023-05-27: qty 1

## 2023-05-27 MED ORDER — LABETALOL HCL 200 MG PO TABS
100.0000 mg | ORAL_TABLET | Freq: Two times a day (BID) | ORAL | 1 refills | Status: DC
Start: 2023-05-27 — End: 2023-07-09
  Filled 2023-05-27: qty 34, 34d supply, fill #0

## 2023-05-27 MED ORDER — GABAPENTIN 100 MG PO CAPS
200.0000 mg | ORAL_CAPSULE | Freq: Two times a day (BID) | ORAL | 0 refills | Status: DC
  Filled 2023-05-27: qty 28, 7d supply, fill #0

## 2023-05-27 MED ORDER — DOCUSATE SODIUM 100 MG PO CAPS
100.0000 mg | ORAL_CAPSULE | Freq: Two times a day (BID) | ORAL | 1 refills | Status: DC
  Filled 2023-05-27: qty 100, 50d supply, fill #0

## 2023-05-27 NOTE — Lactation Note (Signed)
This note was copied from a baby's chart. Lactation Consultation Note  Patient Name: Dawn Hoffman [id number] Date: 05/27/2023 Age:34 hours Reason for consult: Follow-up assessment;Primapara;1st time breastfeeding;Early term 37-38.6wks;Infant < 6lbs  Visited with family of 61 hours old ETI NICU female; Dawn Hoffman is a P2 (early pregnancy loss) and reports [name] has been latching consistently, parents are also supplementing with donor milk until mother's milk comes in. She reports she's been pumping every 3 hours and already getting enough colostrum to collect and bottle feed baby, praised her for her efforts. Dawn Hoffman trying to latch baby Dawn Hoffman when entered the room, assisted with this feeding/attempt to the R breast in football hold, baby had a deep latch with flanged lips but she fell asleep shortly after (see LATCH score). Reviewed feeding cues, ETI behavior, pumping schedule, supplementation and anticipatory guidelines.  Feeding Mother's Current Feeding Choice: Breast Milk and Donor Milk Nipple Type: Nfant Slow Flow (purple)  LATCH Score Latch: Too sleepy or reluctant, no latch achieved, no sucking elicited.  Audible Swallowing: None  Type of Nipple: Everted at rest and after stimulation  Comfort (Breast/Nipple): Soft / non-tender  Hold (Positioning): Assistance needed to correctly position infant at breast and maintain latch.  LATCH Score: 5  Lactation Tools Discussed/Used Tools: Pump;Flanges Flange Size: 21 Breast pump type: Double-Electric Breast Pump Pump Education: Setup, frequency, and cleaning;Milk Storage Reason for Pumping: ETI, supplementation with donor milk Pumping frequency: 6 times/24 hours, q 3 hours was recommended Pumped volume: 5 mL  Interventions Interventions: Breast feeding basics reviewed;Assisted with latch;Breast massage;Hand express;Breast compression;Adjust position;DEBP;Education;Support pillows  Plan Encouraged to  continue putting baby to breast on feeding cues +8 times/24 hours or sooner if feeding cues are present Breast massage and hand expression were also encouraged She'll continue pumping every 3 hours/after feedings at the breast Parents will continue supplementing with EBM/donor milk according the supplementation guidelines per baby's age in hours  FOB present and supportive. All questions and concerns answered, family to contact [facility] services PRN.  Discharge Pump: Stork Pump;Personal (Has a DEBP at home besides [employer] pump)  Consult Status Consult Status: Follow-up Date: 05/28/23 Follow-up type: In-patient   Dawn Hoffman 05/27/2023, 11:35 AM

## 2023-05-27 NOTE — Progress Notes (Signed)
Daily Postpartum Note  Admission Date: 05/22/2023 Current Date: 05/27/2023 7:09 AM  Dawn Hoffman is a 32 y.o. [id number] POD#3 s/p RLTCS after failed TOLAC admitted for [facility] IOL.  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 11/21/2022   Previous cesarean delivery affecting pregnancy, antepartum 11/21/2022   Obesity affecting pregnancy, antepartum 11/21/2022   Supervision of high risk pregnancy, antepartum 11/08/2022   ASTHMA, UNSPECIFIED 01/02/2007    Overnight/24hr events:  None  Subjective:  No s/s of pre-eclampsia; pt meeting all PP/postop goals  Objective:    Current Vital Signs 24h Vital Sign Ranges  T 98 F (36.7 C) Temp  Avg: 98.2 F (36.8 C)  Min: 97.7 F (36.5 C)  Max: 98.8 F (37.1 C)  BP (!) 156/78 BP  Min: 147/81  Max: 156/78  HR 75 Pulse  Avg: 78.8  Min: 75  Max: 83  RR 18 Resp  Avg: 17.4  Min: 16  Max: 18  SaO2 100 %  (ra) SpO2  Avg: 100 %  Min: 100 %  Max: 100 %       24 Hour I/O Current Shift I/O  Time Ins Outs No intake/output data recorded. No intake/output data recorded.   Patient Vitals for the past 24 hrs:  BP Temp Temp src Pulse Resp SpO2  05/27/23 0358 (!) 156/78 98 F (36.7 C) Oral 75 18 100 %  05/26/23 2316 (!) 154/80 97.7 F (36.5 C) Oral 83 18 100 %  05/26/23 1811 (!) 154/85 98.4 F (36.9 C) Oral 78 17 100 %  05/26/23 1333 (!) 147/81 98.8 F (37.1 C) Axillary 76 16 --  05/26/23 1051 (!) 154/66 98.2 F (36.8 C) Oral 82 18 --   Physical exam: General: Well nourished, well developed  female in no acute distress. Abdomen: c/d/I dressing. Mildly distended, nttp, obese Respiratory: no respiratory distress Skin: Warm and dry.   Medications: Current Facility-Administered Medications  Medication Dose Route Frequency Provider Last Rate Last Admin   acetaminophen (TYLENOL) tablet 1,000 mg  1,000 mg Oral Q6H Ndulue, Chiagoziem J, MD   1,000 mg at 05/27/23 0615   coconut oil  1 Application Topical PRN Ndulue, Chiagoziem J, MD       witch hazel-glycerin (TUCKS) pad 1 Application  1 Application Topical PRN Ndulue, Chiagoziem J, MD       And   dibucaine (NUPERCAINAL) 1 % rectal ointment 1 Application  1 Application Rectal PRN Ndulue, Chiagoziem J, MD       diphenhydrAMINE (BENADRYL) injection 12.5 mg  12.5 mg Intravenous Q4H PRN Lacy Duverney M, DO       Or   diphenhydrAMINE (BENADRYL) capsule 25 mg  25 mg Oral Q4H PRN Lacy Duverney M, DO       diphenhydrAMINE (BENADRYL) capsule 25 mg  25 mg Oral Q6H PRN Ndulue, Chiagoziem J, MD       enoxaparin (LOVENOX) injection 50 mg  50 mg Subcutaneous Q24H Ndulue, Chiagoziem J, MD   50 mg at 05/26/23 2318   furosemide (LASIX) tablet 20 mg  20 mg Oral Daily Ndulue, Chiagoziem J,  MD   20 mg at 05/26/23 1107   gabapentin (NEURONTIN) capsule 200 mg  200 mg Oral QHS Ndulue, Chiagoziem J, MD   200 mg at 05/26/23 2127   hydrALAZINE (APRESOLINE) injection 5 mg  5 mg Intravenous PRN Ndulue, Chiagoziem J, MD   5 mg at 05/24/23 1606   And   hydrALAZINE (APRESOLINE) injection 10 mg  10 mg Intravenous PRN Ndulue, Chiagoziem J, MD   10 mg at 05/23/23 2327   And   labetalol (NORMODYNE) injection 20 mg  20 mg Intravenous PRN Ndulue, Chiagoziem J, MD       And   labetalol (NORMODYNE) injection 40 mg  40 mg Intravenous PRN Ndulue, Chiagoziem J, MD       ibuprofen (ADVIL) tablet 600 mg  600 mg Oral Q6H Ndulue, Chiagoziem J, MD   600 mg at 05/27/23 4782   lactated ringers infusion   Intravenous Continuous Ndulue, Chiagoziem J, MD   Stopped at  05/25/23 1400   menthol-cetylpyridinium (CEPACOL) lozenge 3 mg  1 lozenge Oral Q2H PRN Ndulue, Chiagoziem J, MD       naloxone (NARCAN) injection 0.4 mg  0.4 mg Intravenous PRN Lacy Duverney M, DO       And   sodium chloride flush (NS) 0.9 % injection 3 mL  3 mL Intravenous PRN Lacy Duverney M, DO       naloxone HCl (NARCAN) 2 mg in dextrose 5 % 250 mL infusion  1-2 mcg/kg/hr Intravenous Continuous PRN Lacy Duverney M, DO       NIFEdipine (PROCARDIA-XL/NIFEDICAL-XL) 24 hr tablet 30 mg  30 mg Oral Daily Ndulue, Chiagoziem J, MD   30 mg at 05/26/23 1109   ondansetron (ZOFRAN) injection 4 mg  4 mg Intravenous Q8H PRN Lacy Duverney M, DO   4 mg at 05/24/23 1435   oxyCODONE (Oxy IR/ROXICODONE) immediate release tablet 5-10 mg  5-10 mg Oral Q4H PRN Ndulue, Chiagoziem J, MD   5 mg at 05/27/23 0012   prenatal multivitamin tablet 1 tablet  1 tablet Oral Q1200 Ndulue, Chiagoziem J, MD   1 tablet at 05/26/23 1107   promethazine (PHENERGAN) 25 mg in sodium chloride 0.9 % 50 mL IVPB  25 mg Intravenous Q4H PRN Warden Fillers, MD   Stopped at 05/24/23 1630   scopolamine (TRANSDERM-SCOP) 1 MG/3DAYS 1.5 mg  1 patch Transdermal Once Finucane, Elizabeth M, DO   1.5 mg at 05/24/23 1745   senna-docusate (Senokot-S) tablet 2 tablet  2 tablet Oral Daily Ndulue, Chiagoziem J, MD   2 tablet at 05/26/23 1106   simethicone (MYLICON) chewable tablet 80 mg  80 mg Oral TID PC Ndulue, Chiagoziem J, MD   80 mg at 05/26/23 1808   simethicone (MYLICON) chewable tablet 80 mg  80 mg Oral PRN Ndulue, Chiagoziem J, MD       zolpidem (AMBIEN) tablet 5 mg  5 mg Oral QHS PRN Ndulue, Chiagoziem J, MD        Labs:  Pending BCx NGTD Recent Labs  Lab 05/23/23 1050 05/24/23 0945 05/25/23 0301  WBC 11.6* 21.1* 16.4*  HGB 11.5* 12.0 10.6*  HCT 34.0* 36.6 32.8*  PLT 178 223 217    Recent Labs  Lab 05/23/23 2249 05/24/23 0945 05/25/23 0301  NA 132* 128* 132*  K 3.5 4.8 4.1  CL 102 99 103  CO2 18* 15*  20*  BUN 5* <5* 5*  CREATININE 0.51 0.56 0.59  CALCIUM 8.7* 7.9* 7.4*  PROT 6.3* 6.1* 5.5*  BILITOT 0.8 1.2 0.6  ALKPHOS 69 71 50  ALT 22 27 21   AST 23 32 21  GLUCOSE 74 76 99   Radiology:  none  Assessment & Plan:  Patient stable *Postpartum/postop: routine care. B POS/rpr neg/girl/breast and bottle *Severe pre-eclampsia with CHTN: will add labetalol 200 bid to procardia 30 qday and her qday lasix; pt strongly desires d/c to home today and she states she has BP cuff and will check BPs closely. F/u early after BPs *ID: s/p 48h of vanc and rocephin; BCX with NGTD. F/u with ID if okay to go home with BCx still pending *PPx: lovenox *Dispo: likely later today  [name] MD Attending [facility]) GYN Consult Phone: [phone] (M-F, [phone]) & [phone]  (Off hours, weekends, holidays)

## 2023-05-27 NOTE — Discharge Instructions (Signed)
Continue to check your blood pressures twice a day Call the office for blood pressures that are consistently above 150 for the top number or 100 for the bottom number   Hypertension During Pregnancy Hypertension is also called high blood pressure. High blood pressure means that the force of your blood moving in your body is too strong. It can cause problems for you and your baby. Different types of high blood pressure can happen during pregnancy. The types are: High blood pressure before you got pregnant. This is called chronic hypertension.  This can continue during your pregnancy. Your doctor will want to keep checking your blood pressure. You may need medicine to keep your blood pressure under control while you are pregnant. You will need follow-up visits after you have your baby. High blood pressure that goes up during pregnancy when it was normal before. This is called gestational hypertension. It will usually get better after you have your baby, but your doctor will need to watch your blood pressure to make sure that it is getting better. Very high blood pressure during pregnancy. This is called preeclampsia. Very high blood pressure is an emergency that needs to be checked and treated right away. You may develop very high blood pressure after giving birth. This is called postpartum preeclampsia. This usually occurs within 48 hours after childbirth but may occur up to 6 weeks after giving birth. This is rare. How does this affect me? If you have high blood pressure during pregnancy, you have a higher chance of developing high blood pressure: As you get older. If you get pregnant again. In some cases, high blood pressure during pregnancy can cause: Stroke. Heart attack. Damage to the kidneys, lungs, or liver. Preeclampsia. Jerky movements you cannot control (convulsions or seizures). Problems with the placenta.  What can I do to lower my risk?  Keep a healthy weight. Eat a healthy  diet. Follow what your doctor tells you about treating any medical problems that you had before becoming pregnant. It is very important to go to all of your doctor visits. Your doctor will check your blood pressure and make sure that your pregnancy is progressing as it should. Treatment should start early if a problem is found.  Follow these instructions at home:  Take your blood pressure 1-2 times per day. Call the office if your blood pressure is 155 or higher for the top number or 105 or higher for the bottom number.    Eating and drinking  Drink enough fluid to keep your pee (urine) pale yellow. Avoid caffeine. Lifestyle Do not use any products that contain nicotine or tobacco, such as cigarettes, e-cigarettes, and chewing tobacco. If you need help quitting, ask your doctor. Do not use alcohol or drugs. Avoid stress. Rest and get plenty of sleep. Regular exercise can help. Ask your doctor what kinds of exercise are best for you. General instructions Take over-the-counter and prescription medicines only as told by your doctor. Keep all prenatal and follow-up visits as told by your doctor. This is important. Contact a doctor if: You have symptoms that your doctor told you to watch for, such as: Headaches. Nausea. Vomiting. Belly (abdominal) pain. Dizziness. Light-headedness. Get help right away if: You have: Very bad belly pain that does not get better with treatment. A very bad headache that does not get better. Vomiting that does not get better. Sudden, fast weight gain. Sudden swelling in your hands, ankles, or face. Blood in your pee. Blurry vision. Double vision.   Shortness of breath. Chest pain. Weakness on one side of your body. Trouble talking. Summary High blood pressure is also called hypertension. High blood pressure means that the force of your blood moving in your body is too strong. High blood pressure can cause problems for you and your baby. Keep all  follow-up visits as told by your doctor. This is important. This information is not intended to replace advice given to you by your health care provider. Make sure you discuss any questions you have with your health care provider. Document Released: 11/24/2010 Document Revised: 02/12/2019 Document Reviewed: 11/18/2018 Elsevier Patient Education  2020 Elsevier Inc.  

## 2023-05-29 ENCOUNTER — Encounter: Payer: Medicaid Other | Admitting: Obstetrics and Gynecology

## 2023-05-29 LAB — SURGICAL PATHOLOGY

## 2023-05-29 LAB — CULTURE, BLOOD (ROUTINE X 2)

## 2023-05-31 ENCOUNTER — Ambulatory Visit: Payer: Medicaid Other

## 2023-05-31 VITALS — BP 134/87 | HR 84 | Wt 197.2 lb

## 2023-05-31 DIAGNOSIS — Z0130 Encounter for examination of blood pressure without abnormal findings: Secondary | ICD-10-CM

## 2023-05-31 DIAGNOSIS — Z5189 Encounter for other specified aftercare: Secondary | ICD-10-CM

## 2023-05-31 NOTE — Progress Notes (Signed)
...  Subjective:  Dawn Hoffman is a 32 y.o. female here for BP check and incision check after c-section on 05/24/23. Pt states no issues with incision, still has on honeycomb dressing.  Hypertension ROS: taking medications as instructed, no medication side effects noted, no TIA's, no chest pain on exertion, no dyspnea on exertion, and no swelling of ankles.    Objective:  BP 134/87   Pulse 84   Wt 197 lb 3.2 oz (89.4 kg)   LMP 09/05/2022   BMI 38.51 kg/m   Appearance alert, well appearing, and in no distress. General exam BP noted to be well controlled today in office.  Removed honeycomb dressing, incision appears clean and intact with no signs of infection.  Assessment:   Blood Pressure stable.  Incision intact  Plan:  Current treatment plan is effective, no change in therapy.Earlyne Iba with provider and advised pt to continue medications, report abnormal symptoms or go to mau, follow up at pp visit on 07/09/23

## 2023-06-03 ENCOUNTER — Ambulatory Visit: Payer: Self-pay

## 2023-06-03 NOTE — Lactation Note (Cosign Needed)
This note was copied from a baby's chart. Warm hand offer from Resident Fredric Mare to discuss concerns about breastfeeding and milk supply for this family.  10 day old term infant presents with mom for a weight/wellness check. Mom reports low milk supply and reports pumping anywhere from 1-1.5 oz a session; volume at the most has been 2 oz and that occurs is an early pump in the morning. Currently supplementing with formula.   Infant has gained 109 grams in the last 5 days with an average daily weight gain of 21.8 grams a day.    Infant oral assessment with sucking blister to center upper lip. Infant with high labial frenulum, upper lip and upper gum ridge blanches with flanging. 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 Along with above symptoms, LC would like to have an outpatient appointment to observe and assess infant's feeding due to concerns about infant's ability to transfer.  Mom reports pumping with a spectra and a manual pump as needed with a sized 27 mm flange. LC assessed mom's nipple size and noted her flange size is a 24 mm flange.  Discussed pumping after feedings and incorporating one power pump a day to boost milk supply. Discussed infant hunger cues and clusterfeeding, as well as benefits of skin to skin and clusterfeeding for milk supply.    Infant to follow up with [facility] on 8/26. Infant to follow up with Lactation at that appointment or sooner if symptoms worsen

## 2023-06-05 ENCOUNTER — Encounter: Payer: Medicaid Other | Admitting: Obstetrics and Gynecology

## 2023-06-05 ENCOUNTER — Inpatient Hospital Stay (HOSPITAL_COMMUNITY): Payer: Medicaid Other

## 2023-06-06 ENCOUNTER — Ambulatory Visit: Payer: Medicaid Other

## 2023-06-10 ENCOUNTER — Ambulatory Visit: Payer: Self-pay

## 2023-06-10 NOTE — Lactation Note (Cosign Needed Addendum)
This note was copied from a baby's chart. 72 minutes ([id number])  57 week old LPTI infant presents with mom for feeding assessment. Mom reports low milk supply and issues with latching. Milk supply is stagnant and infant is gaining weight. Upper lip tends to curl in with flanging. Madisyn gets sleepy with feeds after about 5-6 minutes and then drifts off to sleep. Infant is not taking bottles. Infant cluster feeding in the evening.   Infant has gained 178 grams in the last 7 days with an average daily weight gain of 25 grams a day.    They are using enfamil and bottle feeding every feed approximately 2-2.5 oz .   Infant oral assessment with sucking blister to center upper lip and she presents with a high labial frenulum, upper lip and upper gum ridge blanches with flanging. Infant feeds with leaking out the corners of her mouth with a bottle and has some disorganization on the breast. Upper labial frenulum is more elastic, but still demonstrates blanching   Infant gets sleepy at the breast after first 2-3 minutes on the right breast when attempting to latch.Left breast latch was secure, consistent and audible swallows were present. Mom's milk supply is still not increasing with interventions previously discussed.  Discussed increasing calories, incorporating more protein and whole grain fiber, and other galactagogues. Patient's mom was not pumping through the night, however, we discussed the importance of pumping to increase milk supply. With pump observation, no milk was expressed on the left breast despite milk being present. Elasticity is present in this breast. LC noted that observing a pumping using PumpinPals may be necessary.    Infant to follow up with Lactation next week for additional pump observation.

## 2023-06-12 ENCOUNTER — Encounter: Payer: Medicaid Other | Admitting: Obstetrics & Gynecology

## 2023-06-17 ENCOUNTER — Ambulatory Visit: Payer: Self-pay

## 2023-06-17 NOTE — Lactation Note (Cosign Needed Addendum)
This note was copied from a baby's chart. [id number] (71 minutes)  23 week old term infant presents with mom for weight check and pump obsrevatoin. Mom reports increase in Milk supply and infant is gaining weight. Mom reports infant is eating more often and actively participating in feeds at this time. Mom has not supplemented with formula at all the last 72 hrs and has been exclusively giving her breastmilk. Mom has been pumping on a schedule and breastmilk volume has increased from 10-15 mls a pump to the observed 76 mls today  Interventions included adjusting flange sizes from a 24 to a 19 mm, as well as incorporating PumpinPal flanges that are made to accommodate elastic nipples to remove milk more effectively from the breast. These Pump Flanges were used in today's visit which yielded approximately 76 mls. Infant was supplemented the full 76, and weight change demonstrated an intake of 72 mls.    Infant has gained 165 grams in the last 4 days with an average daily weight gain of 41.25 grams a day.    Infant is (bottle feeding/exclusively breastfeeding). Mom plans to introduce before returning to work. Reviewed infant may not take then well if waiting past 6 weeks.    Infant to follow up with [facility] in September. Infant to follow up with Lactation in 2 weeks or 1-5 days after tongue and lip releases if completed.

## 2023-07-02 ENCOUNTER — Ambulatory Visit: Payer: Self-pay

## 2023-07-09 ENCOUNTER — Ambulatory Visit (INDEPENDENT_AMBULATORY_CARE_PROVIDER_SITE_OTHER): Payer: Medicaid Other | Admitting: Obstetrics and Gynecology

## 2023-07-09 MED ORDER — LABETALOL HCL 200 MG PO TABS: 200.0000 mg | ORAL_TABLET | Freq: Two times a day (BID) | ORAL | 3 refills | Status: AC

## 2023-07-09 MED ORDER — SLYND 4 MG PO TABS: 1.0000 | ORAL_TABLET | Freq: Every day | ORAL | 3 refills | Status: DC

## 2023-07-09 NOTE — Progress Notes (Signed)
Post Partum Visit Note  Dawn Hoffman is a 32 y.o. [phone] female who presents for a postpartum visit. She is 6 weeks postpartum following a repeat cesarean section.  I have fully reviewed the prenatal and intrapartum course. The delivery was at 37 gestational weeks.  Anesthesia: epidural. Postpartum course has been uncomplicated. Baby is doing well. Baby is feeding by breast. Bleeding staining only. Bowel function is normal. Bladder function is normal. Patient is not sexually active. Contraception method is abstinence. Pt is interested in Pill - Slynd.  Postpartum depression screening: negative, score 1.   The pregnancy intention screening data noted above was reviewed. Potential methods of contraception were discussed. The patient elected to proceed with No data recorded.   Edinburgh Postnatal Depression Scale - 07/09/23 1630       Edinburgh Postnatal Depression Scale:  In the Past 7 Days   I have been able to laugh and see the funny side of things. 0    I have looked forward with enjoyment to things. 0    I have blamed myself unnecessarily when things went wrong. 0    I have been anxious or worried for no good reason. 1    I have felt scared or panicky for no good reason. 0    Things have been getting on top of me. 0    I have been so unhappy that I have had difficulty sleeping. 0    I have felt sad or miserable. 0    I have been so unhappy that I have been crying. 0    The thought of harming myself has occurred to me. 0    Edinburgh Postnatal Depression Scale Total 1             Health Maintenance Due  Topic Date Due   COVID-19 Vaccine (3 - Pfizer risk series) 02/24/2020   INFLUENZA VACCINE  Never done    The following portions of the patient's history were reviewed and updated as appropriate: allergies, current medications, past family history, past medical history, past social history, past surgical history, and problem list.  Review of Systems Pertinent  items are noted in HPI.  Objective:  BP (!) 151/93   Pulse 69   LMP 09/05/2022   Breastfeeding Yes    General:  alert, cooperative, and no distress   Breasts:  not indicated  Lungs: Normal respiratory effort  Heart:  Normal heart rate  Abdomen: Soft, non tender    Wound well approximated incision  GU exam:  not indicated       Assessment:   Postpartum exam Chronic HTN, SIPE w/ SF - mild range BP today, no current meds. Will restart labetalol 200mg  BID  Plan:   Essential components of care per ACOG recommendations:  1.  Mood and well being: Patient with negative depression screening today. Reviewed local resources for support.  - Patient tobacco use? No.   - hx of drug use? No.    2. Infant care and feeding:  -Patient currently breastmilk feeding? Yes. Reviewed importance of draining breast regularly to support lactation.  -Social determinants of health (SDOH) reviewed in EPIC. No concerns  3. Sexuality, contraception and birth spacing - Patient does not want a pregnancy in the next year.  Desired family size is 2 children.  - Reviewed reproductive life planning. Reviewed contraceptive methods based on pt preferences and effectiveness.  Patient desired Oral Contraceptive today.   - Discussed birth spacing of 18 months  4.  Sleep and fatigue -Encouraged family/partner/community support of 4 hrs of uninterrupted sleep to help with mood and fatigue  5. Physical Recovery  - Discussed patients delivery and complications. She describes her labor as good. - Patient had a C-section. - Patient has urinary incontinence? No. - Patient is safe to resume physical and sexual activity  6.  Health Maintenance - HM due items addressed Yes - Last pap smear  Diagnosis  Date Value Ref Range Status  11/21/2022   Final   - Negative for intraepithelial lesion or malignancy (NILM)  -Breast Cancer screening indicated? No.   7. Chronic Disease/Pregnancy Condition follow up:  Hypertension - Resume labetalol 200mg  BID - PCP follow up  Lennart Pall, MD [facility], [facility]

## 2023-07-12 ENCOUNTER — Encounter: Payer: Self-pay | Admitting: Obstetrics and Gynecology

## 2023-07-31 ENCOUNTER — Other Ambulatory Visit: Payer: Self-pay | Admitting: *Deleted

## 2023-11-18 ENCOUNTER — Ambulatory Visit (INDEPENDENT_AMBULATORY_CARE_PROVIDER_SITE_OTHER): Payer: Medicaid Other | Admitting: Licensed Clinical Social Worker

## 2023-11-18 DIAGNOSIS — F4323 Adjustment disorder with mixed anxiety and depressed mood: Secondary | ICD-10-CM | POA: Diagnosis not present

## 2023-11-18 NOTE — BH Specialist Note (Signed)
 Integrated Behavioral Health via Telemedicine Visit  11/21/2023 Dawn Hoffman [id number]  Number of Integrated Behavioral Health Clinician visits: 1- Initial Visit  Session Start time: 1115  Session End time: 1153  Total time in minutes: 38   Referring Provider:  Patient/Family location: At home [facility] Provider location: [facility] All persons participating in visit: Patient and [facility] Types of Service: Individual psychotherapy and Video visit  I connected with Deronda S Chiem and/or Elajah S Bann's patient via  Telephone or Engineer, Civil (consulting)  (Video is Caregility application) and verified that I am speaking with the correct person using two identifiers. Discussed confidentiality: Yes   I discussed the limitations of telemedicine and the availability of in person appointments.  Discussed there is a possibility of technology failure and discussed alternative modes of communication if that failure occurs.  I discussed that engaging in this telemedicine visit, they consent to the provision of behavioral healthcare and the services will be billed under their insurance.  Patient and/or legal guardian expressed understanding and consented to Telemedicine visit: Yes   Presenting Concerns: Patient and/or family reports the following symptoms/concerns: increased anxiety and irritability.  Duration of problem: Months; Severity of problem: moderate  Patient and/or Family's Strengths/Protective Factors: Concrete supports in place (healthy food, safe environments, etc.), Physical Health (exercise, healthy diet, medication compliance, etc.), Caregiver has knowledge of parenting & child development, and Parental Resilience  Goals Addressed: Patient will:  Reduce symptoms of: anxiety   Increase knowledge and/or ability of: coping skills, healthy habits, and self-management skills   Demonstrate ability to: Increase healthy adjustment to current life  circumstances and Increase adequate support systems for patient/family  Progress towards Goals: Ongoing  Interventions: Interventions utilized:  Mindfulness or Management Consultant, Supportive Counseling, Psychoeducation and/or Health Education, Communication Skills, and Supportive Reflection Standardized Assessments completed: Not Needed  Patient and/or Family Response: The patient worked today to process the challenges associated with navigating a high-risk pregnancy and its impact on her mental health. She reported a history of anxiety that was previously manageable but became more pronounced after the birth of her daughter, who is now 67 old. The patient described ongoing anxiety, increased irritability, and feelings of being overwhelmed, particularly with the demands of motherhood and balancing work life. 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 The patient denied any thoughts of harming herself or her baby but expressed a general sense of worry and stress related to her responsibilities. The patient acknowledged having a supportive family, though she admitted she does not like to ask for help. However, she feels comfortable reaching out to trusted supports when needed and will start to initiate regular check-ins with them. This indicates a growing awareness of her need for support and a willingness to use her network effectively.  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 Despite these challenges, she continues to demonstrate insight into her symptoms and is actively seeking to manage them. She reported no immediate safety concerns, as she denied any thoughts of self-harm or harm to her baby..   Patient may benefit from continued support of integrated behavioral  health.  Plan: Follow up with behavioral health clinician on : 12/03/2023 Behavioral recommendations: Continue monitoring your symptoms and take breaks when needed. Try to check in with friends and family and utilize your supports  when needed. Dont be afraid to ask for what you need. Try to do things outside of the house.  Referral(s): Integrated [employer] (In Clinic)  I discussed the assessment and treatment plan with the patient and/or parent/guardian. They were provided an opportunity to ask questions and all were answered. They agreed with the plan and demonstrated an understanding of the instructions.   They were advised to call back or seek an in-person evaluation if the symptoms worsen or if the condition fails to improve as anticipated.  Nalla Purdy LITTIE Seats, LCSWA

## 2023-12-03 ENCOUNTER — Ambulatory Visit: Payer: Medicaid Other | Admitting: Licensed Clinical Social Worker

## 2023-12-03 NOTE — BH Specialist Note (Signed)
Patient no showed for session. A virtual link was sent and a VM was left.

## 2024-05-24 ENCOUNTER — Telehealth (HOSPITAL_COMMUNITY): Payer: Self-pay

## 2024-05-24 ENCOUNTER — Encounter (HOSPITAL_COMMUNITY): Payer: Self-pay

## 2024-05-24 ENCOUNTER — Ambulatory Visit (HOSPITAL_COMMUNITY)
Admission: EM | Admit: 2024-05-24 | Discharge: 2024-05-24 | Disposition: A | Discharge status: Home / Self Care | Payer: MEDICAID | Attending: Emergency Medicine | Admitting: Emergency Medicine

## 2024-05-24 DIAGNOSIS — B029 Zoster without complications: Secondary | ICD-10-CM | POA: Diagnosis not present

## 2024-05-24 MED ORDER — VALACYCLOVIR HCL 1 G PO TABS: 1000.0000 mg | ORAL_TABLET | Freq: Three times a day (TID) | ORAL | 0 refills | Status: AC

## 2024-05-24 MED ORDER — VALACYCLOVIR HCL 1 G PO TABS: 1000.0000 mg | ORAL_TABLET | Freq: Three times a day (TID) | ORAL | 0 refills | Status: DC

## 2024-05-24 NOTE — ED Triage Notes (Signed)
 Patient here today with c/o itchy rash on low back X 3 days.

## 2024-05-24 NOTE — Discharge Instructions (Addendum)
 Start taking Valtrex  3 times daily for 7 days. Return here for reevaluation if the rash worsens, continues to spread, or you develop a fever. Otherwise follow-up with your primary care provider or return here as needed.

## 2024-05-24 NOTE — ED Provider Notes (Signed)
 [facility]    CSN: [id number] Arrival date & time: 05/24/24  1628      History   Chief Complaint Chief Complaint  Patient presents with   Rash    HPI Dawn Hoffman is a 33 y.o. female.   Patient presents with a rash to her low back that began on 7/18.  Patient states that the rash is itchy and at times mildly painful.  Patient denies applying anything to the rash or taking anything for the rash.  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  The history is provided by the patient and medical records.  Rash   Past Medical History:  Diagnosis Date   Anemia    Anxiety    Asthma    Dyspnea    Genital herpes simplex 03/25/2023   Valtrex  500 mg twice daily     Hypertension    MIGRAINE, UNSPEC., W/O INTRACTABLE MIGRAINE 01/02/2007   Qualifier: Diagnosis of   By: WATT, JOANNE      Replacing diagnoses that were inactivated after the 02/04/23 regulatory import   RHINITIS, ALLERGIC 01/02/2007   Qualifier: Diagnosis of   By: WATT, JOANNE      Replacing diagnoses that were inactivated after the 02/04/23 regulatory import   Severe preeclampsia, third trimester 05/24/2023    Patient Active Problem List   Diagnosis Date Noted   Pregnancy with poor obstetric history (neonatal demise at 42 weeks due to ICH) 03/25/2023   ASTHMA, UNSPECIFIED 01/02/2007    Past Surgical History:  Procedure Laterality Date   CESAREAN SECTION N/A 12/08/2018   Procedure: CESAREAN SECTION;  Surgeon: Lorence Ozell CROME, MD;  [facility]: [facility];  Service: Obstetrics;  Laterality: N/A;   CESAREAN SECTION N/A 05/24/2023   Procedure: CESAREAN SECTION;  Surgeon: Cris Burnard DEL, MD;  Location: [facility] LD ORS;  Service: Obstetrics;  Laterality: N/A;    OB History     Gravida  3   Para  2   Term  1   Preterm  1   AB  1   Living  1      SAB  1   IAB      Ectopic      Multiple  0   Live Births  2            Home Medications    Prior to Admission medications   Medication  Sig Start Date End Date Taking? Authorizing Provider  valACYclovir  (VALTREX ) 1000 MG tablet Take 1 tablet (1,000 mg total) by mouth 3 (three) times daily for 7 days. 05/24/24 05/31/24 Yes Johnie Flaming A, NP  Blood Pressure Monitoring (BLOOD PRESSURE KIT) DEVI 1 Device by Does not apply route once a week. 11/08/22   Constant, Peggy, MD  labetalol  (NORMODYNE ) 200 MG tablet Take 1 tablet (200 mg total) by mouth 2 (two) times daily. 07/09/23   Erik Kieth BROCKS, MD    Family History Family History  Problem Relation Age of Onset   Cancer Maternal Grandmother    Diabetes Maternal Grandfather    Alcohol abuse Father    Drug abuse Father    Anemia Mother     Social History Social History   Tobacco Use   Smoking status: Never   Smokeless tobacco: Never  Vaping Use   Vaping status: Never Used  Substance Use Topics   Alcohol use: No   Drug use: No     Allergies   Patient has no known allergies.   Review of  Systems Review of Systems  Skin:  Positive for rash.   Per HPI  Physical Exam Triage Vital Signs ED Triage Vitals  Encounter Vitals Group     BP 05/24/24 1723 (!) 151/92     Girls Systolic BP Percentile --      Girls Diastolic BP Percentile --      Boys Systolic BP Percentile --      Boys Diastolic BP Percentile --      Pulse Rate 05/24/24 1723 74     Resp 05/24/24 1723 16     Temp 05/24/24 1723 99 F (37.2 C)     Temp Source 05/24/24 1723 Oral     SpO2 05/24/24 1723 96 %     Weight --      Height --      Head Circumference --      Peak Flow --      Pain Score 05/24/24 1724 2     Pain Loc --      Pain Education --      Exclude from Growth Chart --    No data found.  Updated Vital Signs BP (!) 151/92 (BP Location: Left Arm)   Pulse 74   Temp 99 F (37.2 C) (Oral)   Resp 16   LMP 05/15/2024 (Approximate)   SpO2 96%   Breastfeeding No   Visual Acuity Right Eye Distance:   Left Eye Distance:   Bilateral Distance:    Right Eye Near:   Left Eye Near:     Bilateral Near:     Physical Exam Vitals and nursing note reviewed.  Constitutional:      General: She is awake. She is not in acute distress.    Appearance: Normal appearance. She is well-developed and well-groomed. She is not ill-appearing.  Skin:    General: Skin is warm and dry.     Findings: Rash present. Rash is vesicular.         Comments: Cluster of vesicular rash noted to left low back  Neurological:     Mental Status: She is alert.  Psychiatric:        Behavior: Behavior is cooperative.      UC Treatments / Results  Labs (all labs ordered are listed, but only abnormal results are displayed) Labs Reviewed - No data to display  EKG   Radiology No results found.  Procedures Procedures (including critical care time)  Medications Ordered in UC Medications - No data to display  Initial Impression / Assessment and Plan / UC Course  I have reviewed the triage vital signs and the nursing notes.  Pertinent labs & imaging results that were available during my care of the patient were reviewed by me and considered in my medical decision making (see chart for details).     Patient is overall well-appearing.  Vitals are stable.  Exam findings consistent with shingles.  Prescribed Valtrex  for shingles coverage.  Discussed follow-up and return precautions. Final Clinical Impressions(s) / UC Diagnoses   Final diagnoses:  Herpes zoster without complication     Discharge Instructions      Start taking Valtrex  3 times daily for 7 days. Return here for reevaluation if the rash worsens, continues to spread, or you develop a fever. Otherwise follow-up with your primary care provider or return here as needed.   ED Prescriptions     Medication Sig Dispense Auth. Provider   valACYclovir  (VALTREX ) 1000 MG tablet Take 1 tablet (1,000 mg total) by mouth 3 (  three) times daily for 7 days. 21 tablet Johnie Flaming A, NP      PDMP not reviewed this encounter.    Johnie Flaming A, NP 05/24/24 1754

## 2024-05-24 NOTE — Telephone Encounter (Signed)
 [facility] is closed. Patient requested [employer].
